# Patient Record
Sex: Male | Born: 1980
Health system: Southern US, Community
[De-identification: ages and names within clinical notes are randomized; demographics above are authoritative.]

## PROBLEM LIST (undated history)

## (undated) DIAGNOSIS — I4892 Unspecified atrial flutter: Secondary | ICD-10-CM

## (undated) DIAGNOSIS — Z720 Tobacco use: Secondary | ICD-10-CM

## (undated) DIAGNOSIS — I509 Heart failure, unspecified: Secondary | ICD-10-CM

## (undated) DIAGNOSIS — I10 Essential (primary) hypertension: Secondary | ICD-10-CM

## (undated) DIAGNOSIS — Z7289 Other problems related to lifestyle: Secondary | ICD-10-CM

## (undated) DIAGNOSIS — Z789 Other specified health status: Secondary | ICD-10-CM

## (undated) DIAGNOSIS — F1090 Alcohol use, unspecified, uncomplicated: Secondary | ICD-10-CM

## (undated) HISTORY — DX: Essential (primary) hypertension: I10

## (undated) NOTE — *Deleted (*Deleted)
***In Progress*** Referring Physician: PCP: [name], FNP PCP-Cardiologist: Dr. Gala Romney   HPI:  Mr Boening is a 59 year old with history of ETOH abuse, atrial flutter, tobacco abuse, and chronic systolic heart failure.   Admitted from the HF clinic on 09/17/20 with A/C decompensated heart failure and marked volume overload. On the day of admit, he continued to decompensate withSVT. He underwent emergent DC-CV and intubation. Placed on norepinephrine, milrinone, and amiodarone infusions.CCM was consulted for acute respiratory failure. Hospital course was complicated by septic shock from aspiration PNA and AKI. Gradually improved and RHC/LHC on 09/30/20 with normal coronaries, EF 40-45% and well compensated filling pressures. RV normal on ECHO. Discharge weight was 261 lbs.   He returned to clinic on 10/09/20 for post-hospital follow-up with Robbie Lis, PA. He was doing well. Weight was stable, down 3 lbs since discharge. Denied dyspnea w/ ADLs. No orthopnea, PND or LEE. Denied palpitations. NSR on EKG. BP was well controlled. Reported full med compliance with no intolerances or side effects. He reported that he had not smoked or consumed ETOH since hospital discharge.   Today he returns to HF clinic for pharmacist medication titration. At last visit with APP clinic, Farxiga 10 mg daily was added.   122/81, 82, 259 lbs *Entresto and Eliquis still sent to [facility] pharm > HF Fund ? Followed by paramedicine *No labs needed Plan A: Incr Coreg 6.25 Plan B: Incr hydral 75 tid / imdur 60/d F/u: 1/3 APP, 2/2 DB  Overall feeling ***. Dizziness, lightheadedness, fatigue:  Chest pain or palpitations:  How is your breathing?: *** SOB: Able to complete all ADLs. Activity level ***  Weight at home pounds. Takes furosemide/torsemide/bumex *** mg *** daily.  LEE PND/Orthopnea  Appetite *** Low-salt diet:   Physical Exam Cost/affordability of meds   HF Medications:  Carvedilol 3.125 mg BID Entresto 97/103 mg BID Spironolactone 25 mg daily Hydralazine 50 mg TID Isosorbide 30 mg daily Farxiga 10 mg daily  Has the patient been experiencing any side effects to the medications prescribed?  {YES NO:22349}  Does the patient have any problems obtaining medications due to transportation or finances?   {YES NO:22349} Eliquis from [facility]  Understanding of regimen: {excellent/good/fair/poor:19665} Understanding of indications: {excellent/good/fair/poor:19665} Potential of compliance: {excellent/good/fair/poor:19665}Followed by paramedicine Patient understands to avoid NSAIDs. Patient understands to avoid decongestants.    Pertinent Lab Values (10/16/20): Marland Kitchen Serum creatinine 1.04, BUN 14, Potassium 4, Sodium 138   Vital Signs: . Weight: *** (last clinic weight: 259 lbs) . Blood pressure: ***  . Heart rate: ***   Assessment: 1. 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  - recent admit for a/c decompensated HF 10/21 c/b SVT leading to cardiogenic shock  - Echo 10/21: EF 20%, RV moderately reduced  - R/LHC showed normal coronaries, EF 40-45%, and compensated filling pressures.Fick cardiac output/index = 8.2/3.3. D/c Wt 261 lb - NYHA class II. Wt stable, down 3 lbs. BP well controlled - Continue*** carvedilol ***3.125 mg BID - Continue Entresto 97/103 mg BID - Continue spironolactone 25 mg daily - Continue hydralazine 50 mg TID - Continue isosorbide mononitrate 30 mg daily - Continue Farxiga 10 mg daily - Continue paramedicine to help w/ med compliance   2. SVT - Recently admitted 10/21 w/ hemodynamically unstable SVT requiring emergent DCCV  - Most recent EKG showed NSR, denied palpitations - Continue*** carvedilol ***3.125 mg BID - He  has quit drinking, which should help reduce risk of recurrence  - Prior plans noted to refer to  EP for ablation if SVT recurs  3. PAF - NSR on recent EKG  - Continue Eliquis for stroke prophylaxis.  - Plans noted for sleep study once he has insurance (h/o snoring and HTN)  4. HTN: controlled on current regimen  - Continue*** carvedilol ***3.125 mg BID - Continue Entresto 97/103 mg BID - Continue spironolactone 25 mg daily - Continue hydralazine 50 mg TID  5. ETOH Abuse - He has not had alcohol since his discharge - Congratulated on efforts   6. Tobacco Abuse  - He has quit smoking - Congratulated on efforts    Plan: 1) Medication changes: Based on clinical presentation, vital signs and recent labs will *** 2) Labs: *** 3) Follow-up: ***  Tama Headings, PharmD, BCPS PGY2 Cardiology Pharmacy Resident  Karle Plumber, PharmD, BCPS, [facility], CPP Heart Failure Clinic Pharmacist [phone]

---

## 2004-09-24 ENCOUNTER — Encounter: Admission: RE | Admit: 2004-09-24 | Discharge: 2004-09-24 | Payer: Self-pay | Admitting: Occupational Medicine

## 2006-01-02 ENCOUNTER — Emergency Department (HOSPITAL_COMMUNITY): Admission: EM | Admit: 2006-01-02 | Discharge: 2006-01-02 | Payer: Self-pay | Admitting: *Deleted

## 2011-05-04 ENCOUNTER — Inpatient Hospital Stay (INDEPENDENT_AMBULATORY_CARE_PROVIDER_SITE_OTHER)
Admission: RE | Admit: 2011-05-04 | Discharge: 2011-05-04 | Disposition: A | Discharge status: Home / Self Care | Payer: Self-pay | Source: Ambulatory Visit | Attending: Family Medicine | Admitting: Family Medicine

## 2011-05-04 DIAGNOSIS — N342 Other urethritis: Secondary | ICD-10-CM

## 2011-05-04 LAB — POCT URINALYSIS DIP (DEVICE)
Bilirubin Urine: NEGATIVE
Glucose, UA: NEGATIVE mg/dL
Hgb urine dipstick: NEGATIVE
Ketones, ur: NEGATIVE mg/dL
Nitrite: NEGATIVE
Protein, ur: NEGATIVE mg/dL
Specific Gravity, Urine: >=1.03 (ref 1.005–1.030)
Urobilinogen, UA: 0.2 mg/dL (ref 0.0–1.0)
pH: 6 (ref 5.0–8.0)

## 2011-05-05 LAB — GC/CHLAMYDIA PROBE AMP, GENITAL
Chlamydia, DNA Probe: NEGATIVE
GC Probe Amp, Genital: NEGATIVE

## 2011-05-06 ENCOUNTER — Inpatient Hospital Stay (INDEPENDENT_AMBULATORY_CARE_PROVIDER_SITE_OTHER)
Admission: RE | Admit: 2011-05-06 | Discharge: 2011-05-06 | Disposition: A | Discharge status: Home / Self Care | Payer: Self-pay | Source: Ambulatory Visit | Attending: Family Medicine | Admitting: Family Medicine

## 2011-05-06 DIAGNOSIS — N342 Other urethritis: Secondary | ICD-10-CM

## 2011-05-06 LAB — POCT URINALYSIS DIP (DEVICE)
Bilirubin Urine: NEGATIVE
Glucose, UA: NEGATIVE mg/dL
Ketones, ur: NEGATIVE mg/dL
Nitrite: NEGATIVE
Protein, ur: NEGATIVE mg/dL
Specific Gravity, Urine: 1.015 (ref 1.005–1.030)
Urobilinogen, UA: 0.2 mg/dL (ref 0.0–1.0)
pH: 7 (ref 5.0–8.0)

## 2016-02-04 ENCOUNTER — Encounter (HOSPITAL_COMMUNITY): Payer: Self-pay | Admitting: Emergency Medicine

## 2016-02-04 ENCOUNTER — Emergency Department (HOSPITAL_COMMUNITY)
Admission: EM | Admit: 2016-02-04 | Discharge: 2016-02-04 | Disposition: A | Discharge status: Home / Self Care | Payer: Self-pay | Source: Home / Self Care | Attending: Emergency Medicine | Admitting: Emergency Medicine

## 2016-02-04 DIAGNOSIS — T148 Other injury of unspecified body region: Secondary | ICD-10-CM

## 2016-02-04 DIAGNOSIS — T148XXA Other injury of unspecified body region, initial encounter: Secondary | ICD-10-CM

## 2016-02-04 MED ORDER — IBUPROFEN 800 MG PO TABS: 800.0000 mg | ORAL_TABLET | Freq: Three times a day (TID) | ORAL | Status: DC | PRN

## 2016-02-04 MED ORDER — METHOCARBAMOL 500 MG PO TABS: 500.0000 mg | ORAL_TABLET | Freq: Three times a day (TID) | ORAL | Status: DC | PRN

## 2016-02-04 MED FILL — METHOCARBAMOL 500 MG TABLET: 500 | 10 days supply | Qty: 30 | Fill #0

## 2016-02-04 NOTE — ED Provider Notes (Signed)
CSN: [id number]     Arrival date & time 02/04/16  1309 History   First MD Initiated Contact with Patient 02/04/16 1451     Chief Complaint  Patient presents with  . Back Pain  . Neck Pain   HPI Antonio Tapia is a 35 y.o. male presenting for 2 days of left neck/shoulder pain.   He woke up with an "aching" and "tight" feeling in the left shoulder involving the left neck radiating to the upper back yesterday. The pain is moderate, present constantly, stable from onset. Ibuprofen 400mg  provided no relief. Turning head left or right makes pain worse. No shooting pain, history of trauma/injury, or pain with breathing.   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 Not a lot of heavy lifting/bending.  History reviewed. No pertinent past medical history. History reviewed. No pertinent past surgical history. No family history on file. Social History  Substance Use Topics  . Smoking status: None  . Smokeless tobacco: None  . Alcohol Use: None   Review of Systems: As above  Allergies  Review of patient's allergies indicates no known allergies.  Home Medications   Prior to Admission medications   Medication Sig Start Date End Date Taking? Authorizing Provider  ibuprofen (ADVIL,MOTRIN) 800 MG tablet Take 1 tablet (800 mg total) by mouth 3 (three) times daily as needed for moderate pain. 02/04/16   Tyrone Nine, MD  methocarbamol (ROBAXIN) 500 MG tablet Take 1 tablet (500 mg total) by mouth every 8 (eight) hours as needed for muscle spasms. 02/04/16   Tyrone Nine, MD   Meds Ordered and Administered this Visit  Medications - No data to display  BP 146/105 mmHg  Pulse 76  Temp(Src) 97.6 F (36.4 C) (Oral)  Resp 20  SpO2 100% No data found.  Physical Exam  Constitutional: He is oriented to person, place, and time. He appears well-developed and well-nourished. No distress.  HENT:  Head: Normocephalic and atraumatic.  Eyes: Conjunctivae are normal. Pupils are equal, round, and reactive to light. No  scleral icterus.  Neck: Neck supple. No JVD present.  Cardiovascular: Normal rate, regular rhythm, normal heart sounds and intact distal pulses.   No murmur heard. Pulmonary/Chest: Effort normal and breath sounds normal. No respiratory distress.  Abdominal: Soft. Bowel sounds are normal. He exhibits no distension. There is no tenderness.  Musculoskeletal:  Cervical paraspinal muscles without spasm, tender to palpation along L SCM and splenius capitis. Neck full felx/ex ROM, limited L/R rotation by pain. Left shoulder normal inspection, palpation, Neer/Hawkins, rotator cuff intact.   Lymphadenopathy:    He has no cervical adenopathy.  Neurological: He is alert and oriented to person, place, and time. He exhibits normal muscle tone.  Skin: Skin is warm and dry.  Vitals reviewed.  ED Course  Procedures (including critical care time)  Labs Review Labs Reviewed - No data to display  Imaging Review No results found.  MDM   1. Muscle strain    Centered around L splenius capitis/SCM likely from abnormal sleeping posture. No radiculopathy/trauma. Recommended rest, heating pad, massage, NSAIDs, tylenol prn. Also Rx short trial of muscle relaxer prn.   This patient's case was discussed with the attending provider who examined the patient and helped formulate the plan of care.   Tyrone Nine, MD 02/04/16 [phone]

## 2016-02-04 NOTE — Discharge Instructions (Signed)
You can take ibuprofen 800mg  three times a day as needed for pain, though I suspect this will get better on its own if you rest it and use heating pad. You should take this with 1000mg  of tylenol to help it work. You can also take the muscle relaxer as needed.   Muscle Strain A muscle strain is an injury that occurs when a muscle is stretched beyond its normal length. Usually a small number of muscle fibers are torn when this happens. Muscle strain is rated in degrees. First-degree strains have the least amount of muscle fiber tearing and pain. Second-degree and third-degree strains have increasingly more tearing and pain.  Usually, recovery from muscle strain takes 1-2 weeks. Complete healing takes 5-6 weeks.  CAUSES  Muscle strain happens when a sudden, violent force placed on a muscle stretches it too far. This may occur with lifting, sports, or a fall.  RISK FACTORS Muscle strain is especially common in athletes.  SIGNS AND SYMPTOMS At the site of the muscle strain, there may be:  Pain.  Bruising.  Swelling.  Difficulty using the muscle due to pain or lack of normal function. DIAGNOSIS  Your health care provider will perform a physical exam and ask about your medical history. TREATMENT  Often, the best treatment for a muscle strain is resting, icing, and applying cold compresses to the injured area.  HOME CARE INSTRUCTIONS   Use the PRICE method of treatment to promote muscle healing during the first 2-3 days after your injury. The PRICE method involves:  Protecting the muscle from being injured again.  Restricting your activity and resting the injured body part.  Icing your injury. To do this, put ice in a plastic bag. Place a towel between your skin and the bag. Then, apply the ice and leave it on from 15-20 minutes each hour. After the third day, switch to moist heat packs.  Apply compression to the injured area with a splint or elastic bandage. Be careful not to wrap it too  tightly. This may interfere with blood circulation or increase swelling.  Elevate the injured body part above the level of your heart as often as you can.  Only take over-the-counter or prescription medicines for pain, discomfort, or fever as directed by your health care provider.  Warming up prior to exercise helps to prevent future muscle strains. SEEK MEDICAL CARE IF:   You have increasing pain or swelling in the injured area.  You have numbness, tingling, or a significant loss of strength in the injured area. MAKE SURE YOU:   Understand these instructions.  Will watch your condition.  Will get help right away if you are not doing well or get worse.

## 2016-02-04 NOTE — ED Notes (Signed)
C/o upper back and neck pain onset yest am when he woke up... States he was fine the night before Denies inj/trauma... Pain increases w/activity... Taking ibup w/no relief.  A&O x4.. No acute distress.

## 2016-08-23 ENCOUNTER — Emergency Department (HOSPITAL_COMMUNITY): Payer: Self-pay

## 2016-08-23 ENCOUNTER — Encounter (HOSPITAL_COMMUNITY): Payer: Self-pay | Admitting: Emergency Medicine

## 2016-08-23 ENCOUNTER — Emergency Department (HOSPITAL_COMMUNITY)
Admission: EM | Admit: 2016-08-23 | Discharge: 2016-08-23 | Disposition: A | Discharge status: Home / Self Care | Payer: Self-pay | Attending: Emergency Medicine | Admitting: Emergency Medicine

## 2016-08-23 DIAGNOSIS — L03211 Cellulitis of face: Secondary | ICD-10-CM | POA: Insufficient documentation

## 2016-08-23 DIAGNOSIS — K0510 Chronic gingivitis, plaque induced: Secondary | ICD-10-CM | POA: Insufficient documentation

## 2016-08-23 DIAGNOSIS — B999 Unspecified infectious disease: Secondary | ICD-10-CM

## 2016-08-23 DIAGNOSIS — Z79899 Other long term (current) drug therapy: Secondary | ICD-10-CM | POA: Insufficient documentation

## 2016-08-23 LAB — URINALYSIS, ROUTINE W REFLEX MICROSCOPIC
Bilirubin Urine: NEGATIVE
Glucose, UA: NEGATIVE mg/dL
Hgb urine dipstick: NEGATIVE
Ketones, ur: NEGATIVE mg/dL
Leukocytes, UA: NEGATIVE
Nitrite: NEGATIVE
Protein, ur: NEGATIVE mg/dL
Specific Gravity, Urine: 1.037 — ABNORMAL HIGH (ref 1.005–1.030)
pH: 6 (ref 5.0–8.0)

## 2016-08-23 LAB — CBC WITH DIFFERENTIAL/PLATELET
Basophils Absolute: 0 10*3/uL (ref 0.0–0.1)
Basophils Relative: 0 %
Eosinophils Absolute: 0.1 10*3/uL (ref 0.0–0.7)
Eosinophils Relative: 1 %
HCT: 41.9 % (ref 39.0–52.0)
Hemoglobin: 14.4 g/dL (ref 13.0–17.0)
Lymphocytes Relative: 12 %
Lymphs Abs: 1.1 10*3/uL (ref 0.7–4.0)
MCH: 33 pg (ref 26.0–34.0)
MCHC: 34.4 g/dL (ref 30.0–36.0)
MCV: 95.9 fL (ref 78.0–100.0)
Monocytes Absolute: 0.9 10*3/uL (ref 0.1–1.0)
Monocytes Relative: 9 %
Neutro Abs: 7.7 10*3/uL (ref 1.7–7.7)
Neutrophils Relative %: 78 %
Platelets: 195 10*3/uL (ref 150–400)
RBC: 4.37 MIL/uL (ref 4.22–5.81)
RDW: 12.2 % (ref 11.5–15.5)
WBC: 9.9 10*3/uL (ref 4.0–10.5)

## 2016-08-23 LAB — COMPREHENSIVE METABOLIC PANEL
ALT: 45 U/L (ref 17–63)
AST: 43 U/L — ABNORMAL HIGH (ref 15–41)
Albumin: 3.5 g/dL (ref 3.5–5.0)
Alkaline Phosphatase: 63 U/L (ref 38–126)
Anion gap: 10 (ref 5–15)
BUN: 11 mg/dL (ref 6–20)
CO2: 23 mmol/L (ref 22–32)
Calcium: 8.8 mg/dL — ABNORMAL LOW (ref 8.9–10.3)
Chloride: 104 mmol/L (ref 101–111)
Creatinine, Ser: 1.26 mg/dL — ABNORMAL HIGH (ref 0.61–1.24)
GFR calc Af Amer: >60 mL/min (ref >60)
GFR calc non Af Amer: >60 mL/min (ref >60)
Glucose, Bld: 103 mg/dL — ABNORMAL HIGH (ref 65–99)
Potassium: 3.6 mmol/L (ref 3.5–5.1)
Sodium: 137 mmol/L (ref 135–145)
Total Bilirubin: 0.8 mg/dL (ref 0.3–1.2)
Total Protein: 7.7 g/dL (ref 6.5–8.1)

## 2016-08-23 LAB — I-STAT CG4 LACTIC ACID, ED
Lactic Acid, Venous: 1.06 mmol/L (ref 0.5–1.9)
Lactic Acid, Venous: 0.91 mmol/L (ref 0.5–1.9)

## 2016-08-23 MED ORDER — LORAZEPAM 2 MG/ML IJ SOLN
0.5000 mg | Freq: Once | INTRAMUSCULAR | Status: AC
  Administered 2016-08-23: 0.5 mg via INTRAVENOUS
  Filled 2016-08-23: qty 1

## 2016-08-23 MED ORDER — SODIUM CHLORIDE 0.9 % IV SOLN
1000.0000 mL | Freq: Once | INTRAVENOUS | Status: AC
  Administered 2016-08-23: 1000 mL via INTRAVENOUS

## 2016-08-23 MED ORDER — CLINDAMYCIN PHOSPHATE 600 MG/50ML IV SOLN
600.0000 mg | Freq: Once | INTRAVENOUS | Status: AC
  Administered 2016-08-23: 600 mg via INTRAVENOUS
  Filled 2016-08-23: qty 50

## 2016-08-23 MED ORDER — SODIUM CHLORIDE 0.9 % IV SOLN
1000.0000 mL | INTRAVENOUS | Status: DC
  Administered 2016-08-23: 1000 mL via INTRAVENOUS

## 2016-08-23 MED ORDER — CLINDAMYCIN HCL 150 MG PO CAPS: 150.0000 mg | ORAL_CAPSULE | Freq: Four times a day (QID) | ORAL | 0 refills | Status: DC

## 2016-08-23 MED ORDER — IOPAMIDOL (ISOVUE-300) INJECTION 61%
INTRAVENOUS | Status: AC
  Administered 2016-08-23: 75 mL
  Filled 2016-08-23: qty 75

## 2016-08-23 NOTE — ED Notes (Signed)
Patient transported to CT 

## 2016-08-23 NOTE — Discharge Instructions (Signed)
Take all antibiotics as prescribed. Return if any swelling in your mouth, difficulty speaking, difficulty swallowing, or difficulty breathing. Follow-up with dentist or oral surgeon as soon as possible

## 2016-08-23 NOTE — ED Triage Notes (Signed)
Pt sts swelling to face and head at eye and back of head x 3 days; no distress noted

## 2016-08-23 NOTE — ED Provider Notes (Signed)
[facility]-EMERGENCY DEPT Provider Note   CSN: [id number] Arrival date & time: 08/23/16  1632     History   Chief Complaint Chief Complaint  Patient presents with  . Facial Swelling    HPI Antonio Tapia is a 35 y.o. male.  HPI This is a 35 year old male who presents today complaining of facial pain and swelling that began 3 days ago and has been gradual on his right cheek. He denies any trauma to his head. He denies any difficulty breathing, speaking, or swallowing. He states that he has very poor dentition and feels that he has had infections in his teeth.He denies any fever at home, lightheadedness, or weakness  History reviewed. No pertinent past medical history.  There are no active problems to display for this patient.   History reviewed. No pertinent surgical history.     Home Medications    Prior to Admission medications   Medication Sig Start Date End Date Taking? Authorizing Provider  ibuprofen (ADVIL,MOTRIN) 800 MG tablet Take 1 tablet (800 mg total) by mouth 3 (three) times daily as needed for moderate pain. 02/04/16   Tyrone Nine, MD  methocarbamol (ROBAXIN) 500 MG tablet Take 1 tablet (500 mg total) by mouth every 8 (eight) hours as needed for muscle spasms. 02/04/16   Tyrone Nine, MD    Family History History reviewed. No pertinent family history.  Social History Social History  Substance Use Topics  . Smoking status: Current Every Day Smoker  . Smokeless tobacco: Never Used  . Alcohol use Yes     Allergies   Review of patient's allergies indicates no known allergies.   Review of Systems Review of Systems  All other systems reviewed and are negative.    Physical Exam Updated Vital Signs BP 141/86   Pulse 87   Temp 99.5 F (37.5 C) (Oral)   Resp 17   SpO2 100%   Physical Exam  Constitutional: He is oriented to person, place, and time. He appears well-developed and well-nourished.  HENT:  Head: Normocephalic and atraumatic.  Right Ear:  Tympanic membrane and external ear normal.  Left Ear: Tympanic membrane and external ear normal.  Nose: Nose normal. Right sinus exhibits no maxillary sinus tenderness and no frontal sinus tenderness. Left sinus exhibits no maxillary sinus tenderness and no frontal sinus tenderness.  Right facial erythema with mild swelling right cheek. Diffuse poor dentition with gingivitis Right preauricular node  Eyes: Conjunctivae and EOM are normal. Pupils are equal, round, and reactive to light. Right eye exhibits no nystagmus. Left eye exhibits no nystagmus.  Neck: Normal range of motion. Neck supple.  Cardiovascular: Normal rate, regular rhythm, normal heart sounds and intact distal pulses.   Pulmonary/Chest: Effort normal and breath sounds normal. No respiratory distress. He exhibits no tenderness.  Abdominal: Soft. Bowel sounds are normal. He exhibits no distension and no mass. There is no tenderness.  Musculoskeletal: Normal range of motion. He exhibits no edema or tenderness.  Neurological: He is alert and oriented to person, place, and time. He has normal strength and normal reflexes. No sensory deficit. He displays a negative Romberg sign. GCS eye subscore is 4. GCS verbal subscore is 5. GCS motor subscore is 6. He displays no Babinski's sign on the right side. He displays no Babinski's sign on the left side.   Speech is normal without dysarthria, dysphasia, or aphasia. Muscle strength is 5/5   Skin: Skin is warm and dry. No rash noted.  Psychiatric: He has a normal  mood and affect. His behavior is normal. Judgment and thought content normal.  Nursing note and vitals reviewed.    ED Treatments / Results  Labs (all labs ordered are listed, but only abnormal results are displayed) Labs Reviewed  COMPREHENSIVE METABOLIC PANEL - Abnormal; Notable for the following:       Result Value   Glucose, Bld 103 (*)    Creatinine, Ser 1.26 (*)    Calcium 8.8 (*)    AST 43 (*)    All other components  within normal limits  CULTURE, BLOOD (ROUTINE X 2)  CULTURE, BLOOD (ROUTINE X 2)  URINE CULTURE  CBC WITH DIFFERENTIAL/PLATELET  URINALYSIS, ROUTINE W REFLEX MICROSCOPIC (NOT AT [facility])  I-STAT CG4 LACTIC ACID, ED  I-STAT CG4 LACTIC ACID, ED    EKG  EKG Interpretation  Date/Time:  Sunday August 23 2016 18:39:43 EDT Ventricular Rate:  93 PR Interval:    QRS Duration: 87 QT Interval:  373 QTC Calculation: 464 R Axis:   30 Text Interpretation:  Normal sinus rhythm Normal ECG Confirmed by Shantina Chronister MD, Duwayne Heck ([id number]) on 08/23/2016 7:39:45 PM       Radiology Ct Maxillofacial W Contrast  Result Date: 08/23/2016 CLINICAL DATA:  Right-sided facial pain and swelling, onset 2 days prior. Concern for abscess/ infection. EXAM: CT MAXILLOFACIAL WITH CONTRAST TECHNIQUE: Multidetector CT imaging of the maxillofacial structures was performed with intravenous contrast. Multiplanar CT image reconstructions were also generated. A small metallic BB was placed on the right temple in order to reliably differentiate right from left. CONTRAST:  78mL ISOVUE-300 IOPAMIDOL (ISOVUE-300) INJECTION 61% COMPARISON:  None. FINDINGS: There are innumerable large dental caries involving both upper and lower teeth. There periapical lucencies about the right upper first and second molars, right upper cuspid left lower first bicuspid and right lower second molar, left lower central incisor, and left lower central molar. Right upper cuspid periapical lucency extends anteriorly with cortical breakthrough with adjacent focus of soft tissue gas. There is complete opacification of the right maxillary sinus with mild enhancement. Probable mucous retention cyst in the left maxillary sinus. Subcutaneous edema about the right greater than left face. In the subcutaneous tissues superficial to the right mandibular ramus is a 1.6 x 1.1 cm low-density structure with peripheral enhancement. Innumerable bilateral enlarged cervical chain nodes,  right greater than left. Likely parotid atrophy bilaterally. Limited intracranial is unremarkable. Visualized orbits are normal. IMPRESSION: 1. Extensive periodontal disease with multiple dental caries and periapical lucencies. Source of complicated infection with likely right upper cuspid, peri-apical lucency has an adjacent focus of gas but no periapical defined fluid collection. There is likely extension of infection into the right maxillary sinus. 2. Subcutaneous soft tissue edema is bilateral. Peripherally enhancing fluid density structure in the right face soft tissues 1.6 cm greatest dimension may be a small abscess versus necrotic node. Additional cervical adenopathy bilaterally is likely reactive. Electronically Signed   By: Rubye Oaks M.D.   On: 08/23/2016 20:34    Procedures Procedures (including critical care time)  Medications Ordered in ED Medications  0.9 %  sodium chloride infusion (1,000 mLs Intravenous New Bag/Given 08/23/16 1847)    Followed by  0.9 %  sodium chloride infusion (1,000 mLs Intravenous New Bag/Given 08/23/16 1850)    Followed by  0.9 %  sodium chloride infusion (1,000 mLs Intravenous New Bag/Given 08/23/16 1853)  clindamycin (CLEOCIN) IVPB 600 mg (600 mg Intravenous New Bag/Given 08/23/16 1853)  LORazepam (ATIVAN) injection 0.5 mg (0.5 mg Intravenous Given 08/23/16  1850)  iopamidol (ISOVUE-300) 61 % injection (75 mLs  Contrast Given 08/23/16 1946)     Initial Impression / Assessment and Plan / ED Course  I have reviewed the triage vital signs and the nursing notes.  Pertinent labs & imaging results that were available during my care of the patient were reviewed by me and considered in my medical decision making (see chart for details).  Clinical Course  Iv clindamycin given.  Patient without spreading erythema.  Likely source dental.  Reviewed ct.  discussed results with patient. He voices that he we'll have the clindamycin filled. He is given referral to  oral surgery. We have discussed return precautions if he voices understanding.   Final Clinical Impressions(s) / ED Diagnoses   Final diagnoses:  Infection  Facial cellulitis  Gingivitis    New Prescriptions New Prescriptions   No medications on file     Margarita Grizzleanielle Beverlie Kurihara, MD 08/23/16 2111

## 2016-08-25 LAB — URINE CULTURE: Culture: NO GROWTH

## 2016-08-28 LAB — CULTURE, BLOOD (ROUTINE X 2)
Culture: NO GROWTH
Culture: NO GROWTH

## 2017-05-28 ENCOUNTER — Telehealth: Payer: Self-pay | Admitting: *Deleted

## 2017-05-28 ENCOUNTER — Ambulatory Visit: Payer: Self-pay | Admitting: Student

## 2017-05-28 NOTE — Telephone Encounter (Signed)
Tried to contact pt about his appointment today, he spoke with Annice Pih and when I went to call him for his appointment with Dr. Alanda Slim he was not there and I checked outside and did not see him. Not sure if he thought his appointment was over after he spoke with Annice Pih or what. Lamonte Sakai, April D, [place]

## 2018-09-19 ENCOUNTER — Emergency Department (HOSPITAL_COMMUNITY)
Admission: EM | Admit: 2018-09-19 | Discharge: 2018-09-19 | Disposition: A | Discharge status: Home / Self Care | Payer: Self-pay | Attending: Emergency Medicine | Admitting: Emergency Medicine

## 2018-09-19 ENCOUNTER — Telehealth (HOSPITAL_COMMUNITY): Payer: Self-pay | Admitting: *Deleted

## 2018-09-19 ENCOUNTER — Other Ambulatory Visit: Payer: Self-pay | Admitting: *Deleted

## 2018-09-19 ENCOUNTER — Encounter (HOSPITAL_COMMUNITY): Payer: Self-pay

## 2018-09-19 ENCOUNTER — Emergency Department (HOSPITAL_COMMUNITY): Payer: Self-pay

## 2018-09-19 ENCOUNTER — Encounter: Payer: Self-pay | Admitting: Family Medicine

## 2018-09-19 ENCOUNTER — Other Ambulatory Visit: Payer: Self-pay

## 2018-09-19 ENCOUNTER — Ambulatory Visit (INDEPENDENT_AMBULATORY_CARE_PROVIDER_SITE_OTHER): Payer: Self-pay | Admitting: Family Medicine

## 2018-09-19 VITALS — BP 154/115 | HR 196 | Temp 98.2°F | Resp 18 | Ht 77.0 in | Wt 252.4 lb

## 2018-09-19 DIAGNOSIS — I4892 Unspecified atrial flutter: Secondary | ICD-10-CM | POA: Insufficient documentation

## 2018-09-19 DIAGNOSIS — F17200 Nicotine dependence, unspecified, uncomplicated: Secondary | ICD-10-CM | POA: Insufficient documentation

## 2018-09-19 DIAGNOSIS — R0789 Other chest pain: Secondary | ICD-10-CM

## 2018-09-19 DIAGNOSIS — R002 Palpitations: Secondary | ICD-10-CM

## 2018-09-19 LAB — BASIC METABOLIC PANEL
Anion gap: 8 (ref 5–15)
BUN: 15 mg/dL (ref 6–20)
CALCIUM: 8.8 mg/dL — AB (ref 8.9–10.3)
CHLORIDE: 105 mmol/L (ref 98–111)
CO2: 24 mmol/L (ref 22–32)
CREATININE: 1.14 mg/dL (ref 0.61–1.24)
GFR calc non Af Amer: >60 mL/min (ref >60)
Glucose, Bld: 101 mg/dL — ABNORMAL HIGH (ref 70–99)
Potassium: 4 mmol/L (ref 3.5–5.1)
Sodium: 137 mmol/L (ref 135–145)

## 2018-09-19 LAB — CBC
HEMATOCRIT: 44.3 % (ref 39.0–52.0)
Hemoglobin: 14.6 g/dL (ref 13.0–17.0)
MCH: 32.7 pg (ref 26.0–34.0)
MCHC: 33 g/dL (ref 30.0–36.0)
MCV: 99.1 fL (ref 78.0–100.0)
PLATELETS: 224 10*3/uL (ref 150–400)
RBC: 4.47 MIL/uL (ref 4.22–5.81)
RDW: 12.4 % (ref 11.5–15.5)
WBC: 4.1 10*3/uL (ref 4.0–10.5)

## 2018-09-19 LAB — GLUCOSE, POCT (MANUAL RESULT ENTRY): POC Glucose: 114 mg/dl — AB (ref 70–99)

## 2018-09-19 LAB — MAGNESIUM: Magnesium: 2.2 mg/dL (ref 1.7–2.4)

## 2018-09-19 MED ORDER — METOPROLOL TARTRATE 50 MG PO TABS: 50.0000 mg | ORAL_TABLET | Freq: Two times a day (BID) | ORAL | 0 refills | Status: DC

## 2018-09-19 MED ORDER — RIVAROXABAN 20 MG PO TABS
20.0000 mg | ORAL_TABLET | Freq: Every day | ORAL | Status: DC
  Administered 2018-09-19: 20 mg via ORAL
  Filled 2018-09-19: qty 1

## 2018-09-19 MED ORDER — RIVAROXABAN 20 MG PO TABS: 20.0000 mg | ORAL_TABLET | Freq: Every day | ORAL | 0 refills | Status: DC

## 2018-09-19 MED ORDER — LORAZEPAM 2 MG/ML IJ SOLN: 0.5000 mg | Freq: Once | INTRAMUSCULAR | Status: DC

## 2018-09-19 MED ORDER — METOPROLOL TARTRATE 5 MG/5ML IV SOLN
5.0000 mg | Freq: Once | INTRAVENOUS | Status: AC
  Administered 2018-09-19: 5 mg via INTRAVENOUS
  Filled 2018-09-19: qty 5

## 2018-09-19 MED ORDER — SODIUM CHLORIDE 0.9 % IV BOLUS
1000.0000 mL | Freq: Once | INTRAVENOUS | Status: AC
  Administered 2018-09-19: 1000 mL via INTRAVENOUS

## 2018-09-19 NOTE — ED Triage Notes (Signed)
[facility]- pt coming from PCP office with complaint of heart racing. Intermittent X4 days. Denies chest pain, nausea, or dizziness. Denies cardiac hx. Pt states he does frequently drink energy drinks.

## 2018-09-19 NOTE — Telephone Encounter (Signed)
LMOM for pt to call and sched. F/u in afib clinic.

## 2018-09-19 NOTE — Discharge Instructions (Signed)
You were in a flutter here You converted to sinus rhythm on your own You are being prescribed blood thinners and are to follow-up with the atrial fib clinic You are also being placed on a beta-blocker to keep your heart rate lower.  Please avoid alcohol.  Stop your ibuprofen Return here if you are worse at any time

## 2018-09-19 NOTE — ED Notes (Addendum)
Pt family arrived and waiting in lobby.

## 2018-09-19 NOTE — Progress Notes (Signed)
Patient with ekg showing a narrow complex tachycardia 190s Reports intermittent sx x 4 days Reports some diaphoresis and headahces Denies any CP, SOB, dizziness Reports sign caffeine intake Denies any h/o HTN or other cardiac concerns Denies any PMH and surgical history Grandfather with HTN Denies taking any meds NKDA 98.2, 196, 154/115, 97% RA, 6'5" 252 lbs, BMI 30 He is AAOx3, NAD Diaphoretic No thyromegaly Tachy, regular CTAB + BS, soft, nt/nd, no masses No edema EKG: sinus, HR 194, st depression, short PR Glucose wnl Patient sent to ER via EMS for further evaluation and mgt of new onset narrow complex tachycardia

## 2018-09-19 NOTE — Progress Notes (Signed)
Labs resulted.

## 2018-09-19 NOTE — ED Provider Notes (Signed)
[facility] EMERGENCY DEPARTMENT Provider Note   CSN: [id number] Arrival date & time: 09/19/18  1224     History   Chief Complaint Chief Complaint  Patient presents with  . Tachycardia    HPI Antonio Tapia is a 37 y.o. male.  HPI  37 year old man presents today complaining of palpitations in the past 4 days.  Ports he does drink alcohol on a regular basis and last drink at 4 AM.  He has not had any significant S alcohol intake.  Was at work today when the palpitations worsened.  He presented to [facility] urgent care with this.  He was noted to have a heart rate up to 200.  He was transferred here.  Is also been drinking caffeinated beverages today.  Denies chest pain, dyspnea, cocaine or travel or drug use.  History reviewed. No pertinent past medical history.  There are no active problems to display for this patient.   History reviewed. No pertinent surgical history.      Home Medications    Prior to Admission medications   Medication Sig Start Date End Date Taking? Authorizing Provider  clindamycin (CLEOCIN) 150 MG capsule Take 1 capsule (150 mg total) by mouth every 6 (six) hours. Patient not taking: Reported on 09/19/2018 08/23/16   Margarita Grizzle, MD  ibuprofen (ADVIL,MOTRIN) 800 MG tablet Take 1 tablet (800 mg total) by mouth 3 (three) times daily as needed for moderate pain. Patient not taking: Reported on 09/19/2018 02/04/16   Tyrone Nine, MD  methocarbamol (ROBAXIN) 500 MG tablet Take 1 tablet (500 mg total) by mouth every 8 (eight) hours as needed for muscle spasms. Patient not taking: Reported on 09/19/2018 02/04/16   Tyrone Nine, MD    Family History History reviewed. No pertinent family history.  Social History Social History   Tobacco Use  . Smoking status: Current Every Day Smoker  . Smokeless tobacco: Never Used  Substance Use Topics  . Alcohol use: Yes  . Drug use: No     Allergies   Patient has no known  allergies.   Review of Systems Review of Systems  All other systems reviewed and are negative.    Physical Exam Updated Vital Signs BP (!) 179/118   Resp (!) 27   Physical Exam  Constitutional: He is oriented to person, place, and time. He appears well-developed and well-nourished.  HENT:  Head: Normocephalic and atraumatic.  Right Ear: External ear normal.  Left Ear: External ear normal.  Mouth/Throat: Oropharynx is clear and moist.  Eyes: Pupils are equal, round, and reactive to light. EOM are normal.  Neck: Normal range of motion.  Cardiovascular: An irregularly irregular rhythm present. Tachycardia present.  Pulmonary/Chest: Effort normal and breath sounds normal.  Abdominal: Soft. Bowel sounds are normal.  Musculoskeletal: Normal range of motion.  Neurological: He is alert and oriented to person, place, and time.  Skin: Skin is warm and dry. Capillary refill takes less than 2 seconds.  Psychiatric: He has a normal mood and affect.  Nursing note and vitals reviewed.    ED Treatments / Results  Labs (all labs ordered are listed, but only abnormal results are displayed) Labs Reviewed  BASIC METABOLIC PANEL  MAGNESIUM  CBC    EKG EKG Interpretation  Date/Time:  Monday September 19 2018 12:26:37 EDT Ventricular Rate:  136 PR Interval:    QRS Duration: 82 QT Interval:  339 QTC Calculation: 510 R Axis:   -95 Text Interpretation:  Atrial flutter with varied AV block, Right superior axis Repol abnrm suggests ischemia, diffuse leads Borderline ST elevation, anterolateral leads Prolonged QT interval Confirmed by Margarita Grizzle [phone]) on 09/19/2018 2:24:36 PM  ED ECG REPORT   Date: 09/19/2018  Rate: 100  Rhythm: normal sinus rhythm  QRS Axis: normal  Intervals: normal  ST/T Wave abnormalities: nonspecific ST/T changes  Conduction Disutrbances:none  Narrative Interpretation:   Old EKG Reviewed: unchanged  I have personally reviewed the EKG tracing and agree  with the computerized printout as noted.   Radiology No results found.  Procedures Procedures (including critical care time)  Medications Ordered in ED Medications  rivaroxaban (XARELTO) tablet 20 mg (has no administration in time range)  LORazepam (ATIVAN) injection 0.5 mg (has no administration in time range)  sodium chloride 0.9 % bolus 1,000 mL (1,000 mLs Intravenous New Bag/Given 09/19/18 1307)     Initial Impression / Assessment and Plan / ED Course  I have reviewed the triage vital signs and the nursing notes.  Pertinent labs & imaging results that were available during my care of the patient were reviewed by me and considered in my medical decision making (see chart for details).         CHA2DS2/VAS Stroke Risk Points  Current as of 7 minutes ago     ? >= 2 Points: High Risk  1 - 1.99 Points: Medium Risk  0 Points: Low Risk    A final score could not be computed because of missing components.:   Last Change: N/A     Details    This score determines the patient's risk of having a stroke if the  patient has atrial fibrillation.       Points Metrics  0 Has Congestive Heart Failure:  No    Current as of 7 minutes ago  0 Has Vascular Disease:  No    Current as of 7 minutes ago  0 Has Hypertension:  No    Current as of 7 minutes ago  0 Age:  22    Current as of 7 minutes ago  0 Has Diabetes:  No    Current as of 7 minutes ago  0 Had Stroke:  No  Had TIA:  No  Had thromboembolism:  No    Current as of 7 minutes ago  0 Male:  No    Current as of 7 minutes ago      This patients CHA2DS2-VASc Score and unadjusted Ischemic Stroke Rate (% per year) is equal to 0.2 % stroke rate/year from a score of 0  Above score calculated as 1 point each if present [CHF, HTN, DM, Vascular=MI/PAD/Aortic Plaque, Age if 65-74, or Male] Above score calculated as 2 points each if present [Age > 75, or Stroke/TIA/TE]      37 year old man history of alcohol use presents today  with atrial flutter.  It spontaneously converted prior to intervention.  He is remained stable here.  His CBC and chemistries are normal.  He did receive IV fluids and Ativan here in ED.  He is also received Xarelto per pharmacy.  He is given coupon.  He will be given referral to fib clinic.  Patient advised to plenty of fluids.  He is started on Lopressor.  Final Clinical Impressions(s) / ED Diagnoses   Final diagnoses:  Atrial flutter, unspecified type [facility])    ED Discharge Orders         Ordered    Amb referral to AFIB Clinic  09/19/18 1237           Margarita Grizzle, MD 09/19/18 1504

## 2018-09-22 ENCOUNTER — Other Ambulatory Visit (HOSPITAL_COMMUNITY): Payer: Self-pay | Admitting: *Deleted

## 2018-09-22 ENCOUNTER — Ambulatory Visit (HOSPITAL_COMMUNITY)
Admission: RE | Admit: 2018-09-22 | Discharge: 2018-09-22 | Disposition: A | Discharge status: Home / Self Care | Payer: Self-pay | Source: Ambulatory Visit | Attending: Nurse Practitioner | Admitting: Nurse Practitioner

## 2018-09-22 ENCOUNTER — Encounter (HOSPITAL_COMMUNITY): Payer: Self-pay | Admitting: Nurse Practitioner

## 2018-09-22 VITALS — BP 158/110 | HR 88 | Ht 72.0 in | Wt 261.0 lb

## 2018-09-22 DIAGNOSIS — I517 Cardiomegaly: Secondary | ICD-10-CM | POA: Insufficient documentation

## 2018-09-22 DIAGNOSIS — I4510 Unspecified right bundle-branch block: Secondary | ICD-10-CM | POA: Insufficient documentation

## 2018-09-22 DIAGNOSIS — Z7901 Long term (current) use of anticoagulants: Secondary | ICD-10-CM | POA: Insufficient documentation

## 2018-09-22 DIAGNOSIS — Z79899 Other long term (current) drug therapy: Secondary | ICD-10-CM | POA: Insufficient documentation

## 2018-09-22 DIAGNOSIS — Z791 Long term (current) use of non-steroidal anti-inflammatories (NSAID): Secondary | ICD-10-CM | POA: Insufficient documentation

## 2018-09-22 DIAGNOSIS — F17200 Nicotine dependence, unspecified, uncomplicated: Secondary | ICD-10-CM | POA: Insufficient documentation

## 2018-09-22 DIAGNOSIS — I483 Typical atrial flutter: Secondary | ICD-10-CM | POA: Insufficient documentation

## 2018-09-22 MED ORDER — METOPROLOL TARTRATE 50 MG PO TABS: 50.0000 mg | ORAL_TABLET | Freq: Two times a day (BID) | ORAL | 3 refills | Status: DC

## 2018-09-22 NOTE — Progress Notes (Signed)
Primary Care Physician: Patient, No Pcp Per Referring Physician:[facility] ER f/u   Antonio Tapia is a 37 y.o. male with a h/o tobacco and alcohol use that is in the afib clinic for ER visit for typical atrial flutter with RVR. He first presented to [facility] with HR around 200 bpm and then sent to [facility] ER. He spontaneously converted without  Intervention. He was started on BB and xarelto for a chadsvasc score of 1 for HTN, his BP was significantly elevated on presentation.  He states that he drinks around 4 alcoholic drinks a night and heavy consumption of caffeine, including energy drinks. No illicit dugs but also smokes tobacco. He has witnessed snoring and apnea spells. 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  Today, he denies symptoms of palpitations, chest pain, shortness of breath, orthopnea, PND, lower extremity edema, dizziness, presyncope, syncope, or neurologic sequela. The patient is tolerating medications without difficulties and is otherwise without complaint today.   No past medical history on file. No past surgical history on file.  Current Outpatient Medications  Medication Sig Dispense Refill  . ibuprofen (ADVIL,MOTRIN) 200 MG tablet Take 600 mg by mouth every 6 (six) hours as needed (or pain or headaches).    . rivaroxaban (XARELTO) 20 MG TABS tablet Take 1 tablet (20 mg total) by mouth daily with supper. 30 tablet 0  . metoprolol tartrate (LOPRESSOR) 50 MG tablet Take 1 tablet (50 mg total) by mouth 2 (two) times daily. 60 tablet 3   No current facility-administered medications for this encounter.     No Known Allergies  Social History   Socioeconomic History  . Marital status: Single    Spouse name: Not on file  . Number of children: Not on file  . Years of education: Not on file  . Highest education level: Not on file  Occupational History    . Not on file  Social Needs  . Financial resource strain: Not on file  . Food insecurity:    Worry: Not on file    Inability: Not on file  . Transportation needs:    Medical: Not on file    Non-medical: Not on file  Tobacco Use  . Smoking status: Current Every Day Smoker  . Smokeless tobacco: Never Used  Substance and Sexual Activity  . Alcohol use: Yes  . Drug use: No  . Sexual activity: Not on file  Lifestyle  . Physical activity:    Days per week: Not on file    Minutes per session: Not on file  . Stress: Not on file  Relationships  . Social connections:    Talks on phone: Not on file    Gets together: Not on file    Attends religious service: Not on file    Active member of club or organization: Not on file    Attends meetings of clubs or organizations: Not on file    Relationship status: Not on file  . Intimate partner violence:    Fear of current or ex partner: Not on file    Emotionally abused: Not on file    Physically abused: Not on file    Forced sexual activity: Not on file  Other Topics Concern  . Not on file  Social History Narrative  . Not on file    No family history on file.  ROS- All  systems are reviewed and negative except as per the HPI above  Physical Exam: Vitals:   09/22/18 0835  BP: (!) 158/110  Pulse: 88  SpO2: 97%  Weight: 118.4 kg  Height: 6' (1.829 m)   Wt Readings from Last 3 Encounters:  09/22/18 118.4 kg  09/19/18 114.5 kg    Labs: Lab Results  Component Value Date   NA 137 09/19/2018   K 4.0 09/19/2018   CL 105 09/19/2018   CO2 24 09/19/2018   GLUCOSE 101 (H) 09/19/2018   BUN 15 09/19/2018   CREATININE 1.14 09/19/2018   CALCIUM 8.8 (L) 09/19/2018   MG 2.2 09/19/2018   No results found for: INR No results found for: CHOL, HDL, LDLCALC, TRIG   GEN- The patient is well appearing, alert and oriented x 3 today.   Head- normocephalic, atraumatic Eyes-  Sclera clear, conjunctiva pink Ears- hearing  intact Oropharynx- clear Neck- supple, no JVP Lymph- no cervical lymphadenopathy Lungs- Clear to ausculation bilaterally, normal work of breathing Heart- Regular rate and rhythm, no murmurs, rubs or gallops, PMI not laterally displaced GI- soft, NT, ND, + BS Extremities- no clubbing, cyanosis, or edema MS- no significant deformity or atrophy Skin- no rash or lesion Psych- euthymic mood, full affect Neuro- strength and sensation are intact  EKG- NSR at 88 bpm, LAE, IRBBB, LAD, pr int 174 ms, qrs int 96 ms, qtc 457 ms Epic records reviewed    Assessment and Plan: 1. New onset typical atrial flutter General education  re atrial flutter Continue metoprolol tartrate at 50 mg bid Could potentially be a candidate at some point for atrial flutter ablation, once financial  assistance is figured out   2. CHA2DS2VASc score of 1 Continue xarelto 20 mg daily Bleeding precautions  3. Lifestyle recommendations Encouraged smoking cessation Decrease caffeine intake, no energy drinks Limit alcohol to 2 drinks a week Needs sleep study for snoring/witnessed  Apnea, currently without insurance could be very expensive, may be able to be ordered thru [employer]    Regular exercise, weight loss encouraged  CSW was in and discussed with pt re options for assistance, she referred  pt to [employer] He may be eligible for insurance thru his work and will inquire  F/u in 3 weeks  Lupita Leash C. [facility] [address] [place], [place] [address] [phone]

## 2018-09-22 NOTE — Progress Notes (Signed)
CSW met with patient in clinic to discuss insurance concerns.  Patient works full time making about $2,000/month but has not signed up for insurance through his employer- patient unsure if they offer a plan.  CSW encouraged patient to discuss insurance with employee to see if they have a plan available- if he cannot get insurance through his job CSW discussed patient going through the [employer] marketplace to get insurance- open enrollment starts November 1.  Regardless the patient would not have insurance start until January 1 so needs a bridge to get treatment until then.   Patient does not have a PCP- if patient can get into [facility] he could pursue orange or blue card to assist with paying for appointments and medications. CSW has left message with CHW to try and get an appointment for patient.  CSW also discussed [place] [facility]- patient would need $5,500 in outstanding medical bills in order to start the process- CSW provided with application and with number to call financial counselors for further information regarding the program.  CSW will continue to follow and assist as needed   H. , LCSW Clinical Social Worker [phone]   

## 2018-09-27 ENCOUNTER — Telehealth: Payer: Self-pay

## 2018-09-27 NOTE — Telephone Encounter (Signed)
Call received from Clent Demark, LCSW requesting an appointment at [facility] for the patient to establish care.  Informed her that there are appointments available at [facility]. The patient can still come to [facility] to apply for an [employer].  Jenn explained that the patient will need to schedule the appointment around his work schedule.  Provided her with the phone number for the clinic and the patient can call and schedule his appointment.

## 2018-09-28 ENCOUNTER — Telehealth (HOSPITAL_COMMUNITY): Payer: Self-pay | Admitting: Licensed Clinical Social Worker

## 2018-09-28 NOTE — Telephone Encounter (Signed)
CSW assisting with getting PCP appointment.  CSW confirmed that Primary Care at [facility], [phone], has appointments available for patients who are uninsured  CSW attempted to call patient multiple times yesterday with no answer- left message for patient today informing that he needs to call [facility] to set up initial appointment  CSW will continue to follow in clinic and assist as needed  Burna Sis, LCSW Clinical Social Worker [phone]

## 2018-10-04 ENCOUNTER — Other Ambulatory Visit: Payer: Self-pay

## 2018-10-04 ENCOUNTER — Emergency Department (HOSPITAL_COMMUNITY)
Admission: EM | Admit: 2018-10-04 | Discharge: 2018-10-04 | Disposition: A | Discharge status: Home / Self Care | Payer: Self-pay | Attending: Emergency Medicine | Admitting: Emergency Medicine

## 2018-10-04 ENCOUNTER — Telehealth (HOSPITAL_COMMUNITY): Payer: Self-pay | Admitting: Licensed Clinical Social Worker

## 2018-10-04 ENCOUNTER — Encounter (HOSPITAL_COMMUNITY): Payer: Self-pay

## 2018-10-04 ENCOUNTER — Emergency Department (HOSPITAL_COMMUNITY): Payer: Self-pay

## 2018-10-04 DIAGNOSIS — Z7901 Long term (current) use of anticoagulants: Secondary | ICD-10-CM | POA: Insufficient documentation

## 2018-10-04 DIAGNOSIS — I10 Essential (primary) hypertension: Secondary | ICD-10-CM

## 2018-10-04 DIAGNOSIS — I509 Heart failure, unspecified: Secondary | ICD-10-CM | POA: Insufficient documentation

## 2018-10-04 DIAGNOSIS — F17210 Nicotine dependence, cigarettes, uncomplicated: Secondary | ICD-10-CM | POA: Insufficient documentation

## 2018-10-04 DIAGNOSIS — I483 Typical atrial flutter: Secondary | ICD-10-CM | POA: Insufficient documentation

## 2018-10-04 DIAGNOSIS — Z79899 Other long term (current) drug therapy: Secondary | ICD-10-CM | POA: Insufficient documentation

## 2018-10-04 HISTORY — DX: Other problems related to lifestyle: Z72.89

## 2018-10-04 HISTORY — DX: Essential (primary) hypertension: I10

## 2018-10-04 HISTORY — DX: Unspecified atrial flutter: I48.92

## 2018-10-04 HISTORY — DX: Other specified health status: Z78.9

## 2018-10-04 HISTORY — DX: Heart failure, unspecified: I50.9

## 2018-10-04 HISTORY — DX: Tobacco use: Z72.0

## 2018-10-04 HISTORY — DX: Alcohol use, unspecified, uncomplicated: F10.90

## 2018-10-04 LAB — I-STAT TROPONIN, ED
Troponin i, poc: 0.04 ng/mL (ref 0.00–0.08)
Troponin i, poc: 0.01 ng/mL (ref 0.00–0.08)

## 2018-10-04 LAB — CBC
HEMATOCRIT: 41.7 % (ref 39.0–52.0)
HEMOGLOBIN: 13.5 g/dL (ref 13.0–17.0)
MCH: 32.1 pg (ref 26.0–34.0)
MCHC: 32.4 g/dL (ref 30.0–36.0)
MCV: 99.3 fL (ref 80.0–100.0)
Platelets: 259 10*3/uL (ref 150–400)
RBC: 4.2 MIL/uL — ABNORMAL LOW (ref 4.22–5.81)
RDW: 12.7 % (ref 11.5–15.5)
WBC: 6.2 10*3/uL (ref 4.0–10.5)
nRBC: 0 % (ref 0.0–0.2)

## 2018-10-04 LAB — BASIC METABOLIC PANEL
Anion gap: 7 (ref 5–15)
BUN: 21 mg/dL — ABNORMAL HIGH (ref 6–20)
CHLORIDE: 106 mmol/L (ref 98–111)
CO2: 23 mmol/L (ref 22–32)
CREATININE: 1.36 mg/dL — AB (ref 0.61–1.24)
Calcium: 8.9 mg/dL (ref 8.9–10.3)
GFR calc Af Amer: >60 mL/min (ref >60)
Glucose, Bld: 98 mg/dL (ref 70–99)
POTASSIUM: 4 mmol/L (ref 3.5–5.1)
SODIUM: 136 mmol/L (ref 135–145)

## 2018-10-04 LAB — BRAIN NATRIURETIC PEPTIDE: B NATRIURETIC PEPTIDE 5: 475.6 pg/mL — AB (ref 0.0–100.0)

## 2018-10-04 MED ORDER — METOPROLOL TARTRATE 75 MG PO TABS: 75.0000 mg | ORAL_TABLET | Freq: Two times a day (BID) | ORAL | 1 refills | Status: DC

## 2018-10-04 MED ORDER — FUROSEMIDE 20 MG PO TABS: 20.0000 mg | ORAL_TABLET | Freq: Every day | ORAL | 1 refills | Status: DC

## 2018-10-04 MED ORDER — SODIUM CHLORIDE 0.9 % IV BOLUS: 1000.0000 mL | Freq: Once | INTRAVENOUS | Status: DC

## 2018-10-04 MED ORDER — FUROSEMIDE 20 MG PO TABS
40.0000 mg | ORAL_TABLET | Freq: Once | ORAL | Status: AC
  Administered 2018-10-04: 40 mg via ORAL
  Filled 2018-10-04: qty 2

## 2018-10-04 MED ORDER — METOPROLOL TARTRATE 25 MG PO TABS
75.0000 mg | ORAL_TABLET | Freq: Once | ORAL | Status: AC
  Administered 2018-10-04: 75 mg via ORAL
  Filled 2018-10-04: qty 3

## 2018-10-04 NOTE — ED Provider Notes (Signed)
[facility] EMERGENCY DEPARTMENT Provider Note   CSN: [id number] Arrival date & time: 10/04/18  0148     History   Chief Complaint Chief Complaint  Patient presents with  . Chest Pain    HPI Antonio Tapia is a 37 y.o. male who has history of recently diagnosed atrial flutter, placed on metoprolol and Xarelto presenting for left-sided chest pain.  Patient states that the pain has been continuous for the last 2 weeks.  He describes it as a mild left-sided throbbing pain that is occasionally accompanied by palpitations.  Patient states that his pain is worsened with exertion and feels that it has slightly increased in severity in the past 2 weeks.  Patient states that he has been compliant with his Xarelto and metoprolol but for the past 2 weeks.  Of note patient is alcohol user, normally drinks 1-2 handles of alcohol weekly.  Patient states that he has an appointment at the A. fib clinic for echocardiogram tomorrow morning.  HPI  Past Medical History:  Diagnosis Date  . Alcohol use   . Atrial flutter (HCC)   . Tobacco use     There are no active problems to display for this patient.   History reviewed. No pertinent surgical history.      Home Medications    Prior to Admission medications   Medication Sig Start Date End Date Taking? Authorizing Provider  ibuprofen (ADVIL,MOTRIN) 200 MG tablet Take 600 mg by mouth every 6 (six) hours as needed (or pain or headaches).   Yes [provider]  rivaroxaban (XARELTO) 20 MG TABS tablet Take 1 tablet (20 mg total) by mouth daily with supper. 09/19/18  Yes Margarita Grizzle, MD  furosemide (LASIX) 20 MG tablet Take 1 tablet (20 mg total) by mouth daily. 10/04/18   Arty Baumgartner, NP  metoprolol tartrate 75 MG TABS Take 75 mg by mouth 2 (two) times daily. 10/04/18   Arty Baumgartner, NP    Family History History reviewed. No pertinent family history.  Social History Social History   Tobacco Use  .  Smoking status: Current Every Day Smoker  . Smokeless tobacco: Never Used  Substance Use Topics  . Alcohol use: Yes  . Drug use: No     Allergies   Patient has no known allergies.   Review of Systems Review of Systems  Constitutional: Negative.  Negative for chills and fever.  HENT: Negative.  Negative for rhinorrhea and sore throat.   Eyes: Negative.  Negative for visual disturbance.  Respiratory: Negative for cough.   Cardiovascular: Positive for chest pain and palpitations. Negative for leg swelling.  Gastrointestinal: Negative.  Negative for abdominal pain, diarrhea, nausea and vomiting.  Genitourinary: Negative.  Negative for dysuria and hematuria.  Musculoskeletal: Negative.  Negative for arthralgias and myalgias.  Skin: Negative.  Negative for rash.  Neurological: Negative.  Negative for dizziness, weakness and headaches.    Physical Exam Updated Vital Signs BP (!) 158/114 (BP Location: Right Arm)   Pulse 84   Temp 98.4 F (36.9 C) (Oral)   Resp (!) 22   SpO2 99%   Physical Exam  Constitutional: He appears well-developed and well-nourished. No distress.  HENT:  Head: Normocephalic and atraumatic.  Right Ear: External ear normal.  Left Ear: External ear normal.  Nose: Nose normal.  Eyes: Pupils are equal, round, and reactive to light. EOM are normal.  Neck: Trachea normal, normal range of motion, full passive range of motion without pain and  phonation normal. Neck supple. No tracheal deviation present.  Cardiovascular: Normal rate and regular rhythm.  Pulses:      Dorsalis pedis pulses are 2+ on the right side, and 2+ on the left side.       Posterior tibial pulses are 2+ on the right side, and 2+ on the left side.  Pulmonary/Chest: Effort normal. No respiratory distress. He has no decreased breath sounds. He exhibits no tenderness, no crepitus and no deformity.  Abdominal: Soft. There is no tenderness. There is no rigidity, no rebound and no guarding.    Musculoskeletal: Normal range of motion.       Right lower leg: Normal. He exhibits no tenderness and no edema.       Left lower leg: Normal. He exhibits no tenderness and no edema.  Neurological: He is alert. GCS eye subscore is 4. GCS verbal subscore is 5. GCS motor subscore is 6.  Speech is clear and goal oriented, follows commands Major Cranial nerves without deficit, no facial droop Normal strength in upper and lower extremities bilaterally including dorsiflexion and plantar flexion, strong and equal grip strength Sensation normal to light touch Moves extremities without ataxia, coordination intact Normal gait  Skin: Skin is warm and dry.  Psychiatric: He has a normal mood and affect. His behavior is normal.   ED Treatments / Results  Labs (all labs ordered are listed, but only abnormal results are displayed) Labs Reviewed  BASIC METABOLIC PANEL - Abnormal; Notable for the following components:      Result Value   BUN 21 (*)    Creatinine, Ser 1.36 (*)    All other components within normal limits  CBC - Abnormal; Notable for the following components:   RBC 4.20 (*)    All other components within normal limits  BRAIN NATRIURETIC PEPTIDE - Abnormal; Notable for the following components:   B Natriuretic Peptide 475.6 (*)    All other components within normal limits  I-STAT TROPONIN, ED  I-STAT TROPONIN, ED    EKG EKG Interpretation  Date/Time:  Tuesday October 04 2018 01:51:40 EDT Ventricular Rate:  82 PR Interval:  172 QRS Duration: 90 QT Interval:  414 QTC Calculation: 483 R Axis:   23 Text Interpretation:  Normal sinus rhythm Left atrial enlargement Prolonged QT Abnormal ECG No significant change since last tracing Confirmed by Tilden Fossa [phone]) on 10/04/2018 7:05:16 AM Also confirmed by Tilden Fossa [phone]), editor Elita Quick [phone])  on 10/04/2018 9:36:14 AM   Radiology Dg Chest 2 View  Result Date: 10/04/2018 CLINICAL DATA:  Chest pain.  EXAM: CHEST - 2 VIEW COMPARISON:  Radiograph 09/19/2018 FINDINGS: Unchanged cardiomegaly. Unchanged mediastinal contours. Progressive perihilar peribronchial thickening. Possible Kerley B-lines on the right. There is fluid in the fissures. No large subpulmonic effusion. No pneumothorax. No acute osseous abnormalities. IMPRESSION: Cardiomegaly unchanged from prior exam. Fluid in the fissures and suspected pulmonary edema, consistent with mild fluid overload. Electronically Signed   By: Narda Rutherford M.D.   On: 10/04/2018 02:23    Procedures Procedures (including critical care time)  Medications Ordered in ED Medications  metoprolol tartrate (LOPRESSOR) tablet 75 mg (has no administration in time range)  furosemide (LASIX) tablet 40 mg (40 mg Oral Given 10/04/18 1351)     Initial Impression / Assessment and Plan / ED Course  I have reviewed the triage vital signs and the nursing notes.  Pertinent labs & imaging results that were available during my care of the patient were reviewed by me  and considered in my medical decision making (see chart for details).  Clinical Course as of Oct 04 1432  Tue Oct 04, 2018  1610 Discussed with Dr. Madilyn Hook; Dr. Madilyn Hook to see.   [BM]  [id number] Patient seen by Dr. Madilyn Hook. Likely CHF. [place]; Dr. Madilyn Hook to call cardiology to see if they would like to follow-up tomorrow or see him today.   [BM]  1012 BNP discussed with Dr. Madilyn Hook; Cardiology Consult ordered   [BM]  1020 Consult to cardiology for admission. They will see patient in department.   [BM]  1152 Patient reevaluated resting comfortably no acute distress.  Denies pain.  Patient and his mother informed of plan of care and are agreeable.  All questions answered   [BM]  1355 Discussed case with cardiology who has seen and evaluated patient in emergency department.  Advise medication changes, metoprolol and Lasix and discharged with follow-up tomorrow morning.   [BM]    Clinical Course User Index [BM]  Bill Salinas, PA-C   Patient with new history of atrial flutter on Xarelto and metoprolol.  History of hypertension.  Today presents with increasing chest pain upon exertion that has been present for 2 weeks.  Troponin negative x2 BMP shows elevated creatinine CBC nonacute BNP of 475 EKG without significant change reviewed by Dr. Madilyn Hook Chest x-ray:  IMPRESSION:  Cardiomegaly unchanged from prior exam. Fluid in the fissures and  suspected pulmonary edema, consistent with mild fluid overload.   Afebrile, not tachycardic, not tachypneic, O2 greater than 97% on room air.  Patient is hypertensive.  Patient has been seen and evaluated by cardiology in department today.  Please see their note for full details.  In short patient to be discharged with Lasix and metoprolol with follow-up A. fib clinic tomorrow for echo.  At this time there does not appear to be any evidence of an acute emergency medical condition and the patient appears stable for discharge with appropriate outpatient follow up. Diagnosis was discussed with patient who verbalizes understanding of care plan and is agreeable to discharge. I have discussed return precautions with patient and mother who verbalize understanding of return precautions. Patient strongly encouraged to follow-up with cardiology. All questions answered.  Patient is also been seen and evaluated by Dr. Madilyn Hook who agrees with plan at this time.    Note: Portions of this report may have been transcribed using voice recognition software. Every effort was made to ensure accuracy; however, inadvertent computerized transcription errors may still be present.  Final Clinical Impressions(s) / ED Diagnoses   Final diagnoses:  Acute congestive heart failure, unspecified heart failure type [facility])    ED Discharge Orders         Ordered    furosemide (LASIX) 20 MG tablet  Daily     10/04/18 1334    metoprolol tartrate 75 MG TABS  2 times daily     10/04/18 1334            Elizabeth Palau 10/04/18 1739    Tilden Fossa, MD 10/08/18 [phone]

## 2018-10-04 NOTE — ED Notes (Signed)
Patient verbalizes understanding of discharge instructions. Opportunity for questioning and answers were provided. Armband removed by staff, pt discharged from ED.  

## 2018-10-04 NOTE — ED Notes (Signed)
Cardiology @ bedside.

## 2018-10-04 NOTE — ED Notes (Signed)
ED Provider at bedside. 

## 2018-10-04 NOTE — Telephone Encounter (Signed)
CSW received call from patients mom inquiring about available PCP information for pt without insurance- CSW provided pt mom with the name and number for Primary Care at [facility] and that they would be able to set up an appointment to discuss orange card at [facility] from there.  No further needs or concerns at this time  Burna Sis, LCSW Clinical Social Worker [facility] [phone]

## 2018-10-04 NOTE — Discharge Instructions (Addendum)
Please return to the Emergency Department for any new or worsening symptoms or if your symptoms do not improve. Please be sure to follow up with your Primary Care Physician as soon as possible regarding your visit today. If you do not have a Primary Doctor please use the resources below to establish one. Please take your new medications furosemide and metoprolol as prescribed.  Please take all of your other medications as prescribed. Please go to your cardiology appointment tomorrow morning.  Contact a health care provider if: You have a rapid weight gain. You have increasing shortness of breath that is unusual for you. You are unable to participate in your usual physical activities. You tire easily. You cough more than normal, especially with physical activity. You have any swelling or more swelling in areas such as your hands, feet, ankles, or abdomen. You are unable to sleep because it is hard to breathe. You feel like your heart is beating quickly (palpitations). You become dizzy or light-headed when you stand up. Get help right away if: You have difficulty breathing. You notice or your family notices a change in your awareness, such as having trouble staying awake or having difficulty with concentration. You have pain or discomfort in your chest. You have an episode of fainting (syncope).  Do not take your medicine if  develop an itchy rash, swelling in your mouth or lips, or difficulty breathing.   RESOURCE GUIDE  Chronic Pain Problems: Contact [facility]  [phone] Patients need to be referred by their primary care doctor.  Insufficient Money for Medicine: Contact United Way:  call "211" or [facility] 2096268432.  No Primary Care Doctor: Call [facility] Connect  [phone] - can help you locate a primary care doctor that  accepts your insurance, provides certain services, etc. Physician Referral Service- [phone]  Agencies that provide  inexpensive medical care: [facility]  [phone] [facility]  [phone] [facility]  [phone] [facility]  [phone] Planned Parenthood  [phone] [facility]  [phone]  Medicaid-accepting [facility] Providers: [facility]- [address], [address]  [phone], Mon-Fri 9am-7pm, Sat 9am-1pm [facility]- [address] [place], Suite [place]  [phone] [facility]- [address], Suite [place]  [phone] [facility]- [address]  [phone] Renaye Rakers- [address] [place], [address], [phone]  Only accepts [place] patients after they have their name  applied to their card  Self Pay (no insurance) in [facility]: Sickle Cell Patients: Dr Willey Blade, [facility] Internal Medicine  [address] [place], [phone] [facility]- [address] [place]  [phone]       [facility] [place]- [address]       -     [facility]- see information above (Speak to [employer] if you do not have insurance)       -  [facility]- [address] [place], [phone]       -  [facility]- [address] [place],  [phone]       -  [facility]- [address], [phone]       -  Dr Julio Sicks-  [address], [place], [phone]       -  [facility]- [address], [phone]       -  [facility]- [address], [phone], also [address], [phone]       -    [facility]- [address] [place], [phone], 1st & 3rd Saturday   every month, 10am-1pm  1) Find a Doctor and Pay Out of Pocket Although you won't have to find out who is covered by your insurance plan, it is a good idea to ask around and get recommendations. You will then need to call the office and see if the doctor you have chosen will accept you as a new  patient and what types of options they offer for patients who are self-pay. Some doctors offer discounts or will set up payment plans for their patients who do not have insurance, but you will need to ask so you aren't surprised when you get to your appointment.  2) Contact Your Local Health Department Not all health departments have doctors that can see patients for sick visits, but many do, so it is worth a call to see if yours does. If you don't know where your local health department is, you can check in your phone book. The CDC also has a tool to help you locate your state's health department, and many state websites also have listings of all of their local health departments.  3) Find a Walk-in Clinic If your illness is not likely to be very severe or complicated, you may want to try a walk in clinic. These are popping up all over the country in pharmacies, drugstores, and shopping centers. They're usually staffed by nurse practitioners or physician assistants that have been trained to treat common illnesses and complaints. They're usually fairly quick and inexpensive. However, if you have serious medical issues or chronic medical problems, these are probably not your best option  STD Testing [facility] Department of [facility], STD Clinic, [address] [place], phone [phone] or [phone].  Monday - Friday, call for an appointment. [facility] Department of [employer], STD Clinic, [place] [address], [place], phone [phone] or [phone].  Monday - Friday, call for an appointment.  Abuse/Neglect: [facility] Child Abuse Hotline 727-036-5880 [facility] Child Abuse Hotline 315-749-8496 (After Hours)  Emergency Shelter:  [facility] [phone]  Maternity Homes: Room at the [place] [facility] [phone] [facility] [phone]  MRSA Hotline #:   478-349-1714  [facility]  Resources  [facility]  United [employer] [facility] Dept. [address]                 [address]         [address] [place] [address]  [place]                                               Cristobal Goldmann Phone:  [phone]                                  Phone:  [phone]  Phone:  [phone]  [facility], [phone] [employer] [facility]- [phone]       -     [facility] in [place], [address],                                  [phone], [facility] Child Abuse Hotline 9173990027 or 8161134630 (After Hours)   Behavioral Health Services  Substance Abuse Resources: Alcohol and Drug Services  920-068-0097 [facility] 8452503229 [facility] [place] [phone] Floydene Flock 9070530297 Residential & Outpatient Substance Abuse Program  [phone]  Psychological Services: [facility]  [phone] [facility] Services  [phone] [facility], [phone] [place]. [address], [place], ACCESS LINE: 571-471-7984 or 902-133-4131, [employer]za  Dental Assistance  If unable to pay or uninsured, contact:  [facility] or [facility] [place]. to become qualified for the adult dental clinic.  Patients with Medicaid: [facility] [phone] [address] Qu[address], [phone] [address] [phone]  If unable to pay, or uninsured, contact [facility] [phone])[phone]) or [facility] Department [phone] in [place], [phone] in [facility]) to become qualified for the adult dental clinic   Other Low-Cost Community Dental Services: [facility]- [address] [place], [place], [place], [address], [phone], Ext. [phone], 2nd and 4th Thursday of the month at 6:30am.  10 clients each day by appointment, can sometimes  see walk-in patients if someone does not show for an appointment. [facility]- [address] [place]ins [place], [place], [address], [phone] [facility] [address] [place], [place], [address], [phone] [facility] Health Department- [phone] [facility] Health Department- [phone] [facility] [phone]

## 2018-10-04 NOTE — ED Triage Notes (Signed)
Pt from home. Per EMS pt has had recent dx of afib. Not in afib at present. Pt has has increasing chest pain with exertion for 2 weeks, worse today. Also became diaphoretic today. No pain at rest. No shob, n/v.   158/112, hx of htn.  Pt is on xarelto and metoprolol.  Fire gave 324 ASA.

## 2018-10-04 NOTE — Consult Note (Addendum)
Cardiology Consult    Patient ID: Antonio Tapia MRN: [id number], DOB/AGE: Jan 10, 1981   Admit date: 10/04/2018 Date of Consult: 10/04/2018  Primary Physician: Patient, No Pcp Per Primary Cardiologist: Rudi Coco NP (afib clinic) Requesting Provider: Dr. Madilyn Hook  Reason for Consultation: Chest pain   Antonio Tapia is a 37 y.o. male who is being seen today for the evaluation of chest pain at the request of Dr. Madilyn Hook.   Patient Profile    37 yo male with PMH of Aflutter, tobacco use and ETOH use who presented with chest pain and shortness of breath.   Past Medical History   Past Medical History:  Diagnosis Date  . Alcohol use   . Atrial flutter (HCC)   . Tobacco use     History reviewed. No pertinent surgical history.   Allergies  No Known Allergies  History of Present Illness    Antonio Tapia is a 37 yo male with PMH of Aflutter, tobacco use and ETOH use. He was seen in the ED back in Oct for new onset Aflutter and referred to the Afib clinic as an outpatient. Placed on metoprolol 50mg  BID and Xarelto. Was seen in the clinic on 09/22/18 and noted to be in SR at that time. He was continued on medications without change. It was noted that he may be a candidate for possible flutter ablation in the future once his insurance was figured out. ]  Reports since this appt he developed chest tightness at times with exertion. Reports when walking up stairs he often has to stop and rest and feels short of breath as well. Denies any LE edema, orthopnea or PND. He continues to use ETOH but is attempting to cut back on his use. This morning reports he was sleeping and awoke with shortness of breath and chest tightness prompting him to present to the ED.   In the ED his labs showed stable electrolytes, BNP 475, POC trop 0.04, Hgb 13.5. CXR with cardiomegaly unchanged from previous CXR and mild fluid overload. EKG showed SR. At the time of exam he is comfortable and laying flat in bed. No chest pain.     Inpatient Medications      Family History    History reviewed. No pertinent family history.  Social History    Social History   Socioeconomic History  . Marital status: Single    Spouse name: Not on file  . Number of children: Not on file  . Years of education: Not on file  . Highest education level: Not on file  Occupational History  . Not on file  Social Needs  . Financial resource strain: Not on file  . Food insecurity:    Worry: Not on file    Inability: Not on file  . Transportation needs:    Medical: Not on file    Non-medical: Not on file  Tobacco Use  . Smoking status: Current Every Day Smoker  . Smokeless tobacco: Never Used  Substance and Sexual Activity  . Alcohol use: Yes  . Drug use: No  . Sexual activity: Not on file  Lifestyle  . Physical activity:    Days per week: Not on file    Minutes per session: Not on file  . Stress: Not on file  Relationships  . Social connections:    Talks on phone: Not on file    Gets together: Not on file    Attends religious service: Not on file    Active member of  club or organization: Not on file    Attends meetings of clubs or organizations: Not on file    Relationship status: Not on file  . Intimate partner violence:    Fear of current or ex partner: Not on file    Emotionally abused: Not on file    Physically abused: Not on file    Forced sexual activity: Not on file  Other Topics Concern  . Not on file  Social History Narrative  . Not on file     Review of Systems    See HPI  All other systems reviewed and are otherwise negative except as noted above.  Physical Exam    Blood pressure (!) 158/114, pulse 84, temperature 98.4 F (36.9 C), temperature source Oral, resp. rate (!) 22, SpO2 99 %.  General: Pleasant, young AAM, NAD Psych: Normal affect. Neuro: Alert and oriented X 3. Moves all extremities spontaneously. HEENT: Normal  Neck: Supple, no JVD. Lungs:  Resp regular and unlabored,  CTA. Heart: RRR no murmurs. Abdomen: Soft, non-tender, non-distended, BS + x 4.  Extremities: No clubbing, cyanosis or edema. DP/PT/Radials 2+ and equal bilaterally.  Labs    Troponin [facility] of Care Test) Recent Labs    10/04/18 0213  TROPIPOC 0.04   No results for input(s): CKTOTAL, CKMB, TROPONINI in the last 72 hours. Lab Results  Component Value Date   WBC 6.2 10/04/2018   HGB 13.5 10/04/2018   HCT 41.7 10/04/2018   MCV 99.3 10/04/2018   PLT 259 10/04/2018    Recent Labs  Lab 10/04/18 0209  NA 136  K 4.0  CL 106  CO2 23  BUN 21*  CREATININE 1.36*  CALCIUM 8.9  GLUCOSE 98   No results found for: CHOL, HDL, LDLCALC, TRIG No results found for: [facility]   Radiology Studies    Dg Chest 2 View  Result Date: 10/04/2018 CLINICAL DATA:  Chest pain. EXAM: CHEST - 2 VIEW COMPARISON:  Radiograph 09/19/2018 FINDINGS: Unchanged cardiomegaly. Unchanged mediastinal contours. Progressive perihilar peribronchial thickening. Possible Kerley B-lines on the right. There is fluid in the fissures. No large subpulmonic effusion. No pneumothorax. No acute osseous abnormalities. IMPRESSION: Cardiomegaly unchanged from prior exam. Fluid in the fissures and suspected pulmonary edema, consistent with mild fluid overload. Electronically Signed   By: Narda Rutherford M.D.   On: 10/04/2018 02:23   Dg Chest Port 1 View  Result Date: 09/19/2018 CLINICAL DATA:  heart racing. Intermittent X4 days.some left chest discomfort EXAM: PORTABLE CHEST 1 VIEW COMPARISON:  01/02/2006 FINDINGS: Mild enlargement of the cardiopericardial silhouette, increased from the prior chest radiograph. No mediastinal or hilar masses. No convincing adenopathy. Clear lungs.  No pleural effusion or pneumothorax. Skeletal structures are intact. IMPRESSION: 1. No acute cardiopulmonary disease. 2. Mild cardiomegaly. Electronically Signed   By: Amie Portland M.D.   On: 09/19/2018 13:29    ECG & Cardiac Imaging    EKG:  The EKG  was personally reviewed and demonstrates SR with prolonged QT interval noted on previous.   Assessment & Plan    37 yo male with PMH of Aflutter, tobacco use and ETOH use who presented with chest pain and shortness of breath.   1. Dyspnea/Chest pain: Suspect mild volume overload likely related to recent dx of Aflutter. CXR with mild fluid overload noted, and cardiomegaly. BNP elevated at 475. No signs of significant volume overload noted on exam. Troponin 0.04 in the ED. EKG without acute changes. Comfortable without chest pain at the time of  exam.  -- check second trop. If negative would plan for discharge and follow up in the Afib clinic. -- given 40mg  oral lasix now, and plan on 20mg  oral lasix daily until his follow up in the afib clinic on 10/31.  -- has echo scheduled outpatient tomorrow and instructed to keep this.   2. Aflutter: Recent diagnosis this month and has been on metoprolol and Xarelto as an outpatient. Reports being complaint with medications. SR while in the ED and denies any recurrence of symptoms. Blood pressure has been elevated while in the ED. Will have him increase his metoprolol to 75mg  BID and follow his readings at home. Encouraged to continue to reduce his caffeine and ETOH intake as well.   3. HTN: blood pressures are elevated in the ED. Will have him further increase his metoprolol to 75mg  BID and follow BPs at home.   4. Tobacco/ETOH use: encouraged to continue to reduce intake. He is trying to quit.   Janice Coffin, NP-C Pager [phone] 10/04/2018, 1:34 PM   I have personally seen and examined this patient with Laverda Page, NP. I agree with the assessment and plan as outlined above. Antonio Tapia is a 37 yo male with history of HTN, tobacco and etoh use with recent diagnosis of atrial flutter. He has been started on Metoprolol and Xarelto. He has been seen by our EP team. He has seen Rudi Coco NP in the atrial fibrillation clinic. He is to be  followed in our EP clinic. He presents to the ED today with c/o chest tightness and dyspnea with exertion. Chest x-ray shows mild pulmonary edema. EKG reviewed by me and shows sinus rhythm with prolonged QT interval. Troponin negative. My exam: No acute distress. Lungs: mild basilar crackles  CV:RRR no murmurs Abd: soft, NT,ND  Ext: no LE edema Plan:  Chest pain: Atypical. No objective evidence of ischemia. No chest pain at rest. He has evidence of mild volume overload. Plans in place for outpatient echo tomorrow. I would give one dose of IV Lasix in the ED and if his serial troponin is negative, could discharge home today. I would begin Lasix 20 mg daily until he is seen back in follow up in our office in 9 days. His long term follow up will be in our EP clinic.   Verne Carrow 10/04/2018 2:34 PM

## 2018-10-04 NOTE — ED Notes (Signed)
Pt reports upper left chest wall pain at his shoulder that only occurs when he exerts himself. Pt currently pain free.

## 2018-10-05 ENCOUNTER — Ambulatory Visit (HOSPITAL_COMMUNITY)
Admission: RE | Admit: 2018-10-05 | Discharge: 2018-10-05 | Disposition: A | Discharge status: Home / Self Care | Payer: Self-pay | Source: Ambulatory Visit | Attending: Nurse Practitioner | Admitting: Nurse Practitioner

## 2018-10-05 ENCOUNTER — Telehealth (HOSPITAL_COMMUNITY): Payer: Self-pay | Admitting: *Deleted

## 2018-10-05 DIAGNOSIS — I081 Rheumatic disorders of both mitral and tricuspid valves: Secondary | ICD-10-CM | POA: Insufficient documentation

## 2018-10-05 DIAGNOSIS — I4891 Unspecified atrial fibrillation: Secondary | ICD-10-CM | POA: Insufficient documentation

## 2018-10-05 DIAGNOSIS — I483 Typical atrial flutter: Secondary | ICD-10-CM | POA: Insufficient documentation

## 2018-10-05 NOTE — Telephone Encounter (Addendum)
error 

## 2018-10-05 NOTE — Progress Notes (Signed)
  Echocardiogram 2D Echocardiogram has been performed.  Antonio Tapia 10/05/2018, 9:53 AM

## 2018-10-10 ENCOUNTER — Encounter: Payer: Self-pay | Admitting: Family Medicine

## 2018-10-10 ENCOUNTER — Ambulatory Visit (INDEPENDENT_AMBULATORY_CARE_PROVIDER_SITE_OTHER): Payer: Self-pay | Admitting: Family Medicine

## 2018-10-10 VITALS — BP 152/112 | HR 79 | Temp 97.5°F | Resp 17 | Ht 77.0 in | Wt 259.2 lb

## 2018-10-10 DIAGNOSIS — I509 Heart failure, unspecified: Secondary | ICD-10-CM

## 2018-10-10 DIAGNOSIS — G4730 Sleep apnea, unspecified: Secondary | ICD-10-CM

## 2018-10-10 DIAGNOSIS — Z23 Encounter for immunization: Secondary | ICD-10-CM

## 2018-10-10 DIAGNOSIS — I10 Essential (primary) hypertension: Secondary | ICD-10-CM

## 2018-10-10 DIAGNOSIS — I483 Typical atrial flutter: Secondary | ICD-10-CM

## 2018-10-10 MED ORDER — RIVAROXABAN 20 MG PO TABS: 20.0000 mg | ORAL_TABLET | Freq: Every day | ORAL | 5 refills | Status: DC

## 2018-10-10 MED ORDER — AMLODIPINE BESYLATE 5 MG PO TABS: 5.0000 mg | ORAL_TABLET | Freq: Every day | ORAL | 3 refills | Status: DC

## 2018-10-10 MED FILL — XARELTO 20 MG TABLET: 20 | 30 days supply | Qty: 30 | Fill #0

## 2018-10-10 MED FILL — AMLODIPINE BESYLATE 5 MG TA: 5 | 30 days supply | Qty: 30 | Fill #0

## 2018-10-10 NOTE — Progress Notes (Signed)
Antonio Tapia, is a 37 y.o. male  LOV:[id number]  OAC:[id number]  DOB - [birth year]  CC:  Chief Complaint  Patient presents with  . Establish Care  . Follow-up    ED on 10/22 for L sided chest pain w/occasional palpitations. states that he still has some chest pain w/exertion. denies lower extremity swelling, headache, vision changes       HPI: Antonio Tapia is a 37 y.o. male is here today to establish care.   Antonio Tapia has Acute congestive heart failure (HCC); Typical atrial flutter (HCC); and Essential hypertension on their problem list.    Today's visit:  ER follow-up x 2 visit. Patient is a current every day smoker, light drinker, with a current Body mass index is 30.74 kg/m.   09/19/2018-ER follow-up Patient initially presented to Primary Care at [facility] with a complaint of rapid heart rate and chest pain. EKG was significant Tachycardia Rate 184. He was transported via EMS to [facility] and there he was found to be in Atrial flutter.  He was started on metoprolol and anticoagulation  therapy initiated with Xarelto. He was referred to Atrial Fib clinic and established care there on 09/22/2018. Of note his BP was elevated at the ER and Atrial Fib clinic. He was continued on metoprolol therapy for rate control -Xarelto given CHA2DS2VASc score of 1.   10/04/2018- ER follow-up  Patient presented again to [facility] with a complaint of left sided chest pain with palpitations. Chest x-ray was significant for possible pulmonary edema and cardiomegaly with fluid overload. Recent Echocardiogram confirmed HF. Discharged with increased dose metoprolol and lasix. He followed up the following day and had an echocardiogram completed. Echocardiogram significant for reduced systolic function, regurgitation noted mitral, tricuspid, and pulmonic valve. Pulmonary artery pressure elevated which was significant for pulmonary hypertension. Cardiology has referred him to the heart failure clinic.  Today he reports  occasional chest discomfort. Blood pressure remains elevated. He is complaint with medications. Of note patient reports that he has been told that he stops breathing during night-time rest and snoring. No prior diagnosis of sleep apnea or sleep study. No prior issues with hypertension or cardiovascular disease. He is uninsured. He has been able to obtain medication.  He is a current daily smoker with occasional alcohol use.  Patient denies new headaches, abdominal pain, nausea, new weakness , numbness or tingling, SOB, edema, or worrisome cough.   Current medications: Current Outpatient Medications:  .  furosemide (LASIX) 20 MG tablet, Take 1 tablet (20 mg total) by mouth daily., Disp: 30 tablet, Rfl: 1 .  ibuprofen (ADVIL,MOTRIN) 200 MG tablet, Take 600 mg by mouth every 6 (six) hours as needed (or pain or headaches)., Disp: , Rfl:  .  metoprolol tartrate 75 MG TABS, Take 75 mg by mouth 2 (two) times daily., Disp: 60 tablet, Rfl: 1 .  rivaroxaban (XARELTO) 20 MG TABS tablet, Take 1 tablet (20 mg total) by mouth daily with supper., Disp: 30 tablet, Rfl: 0   Pertinent family medical history: family history includes Breast cancer in his paternal grandfather; Diabetes in his paternal grandmother; Hypertension in his father and mother.   No Known Allergies  Social History   Socioeconomic History  . Marital status: Single    Spouse name: Not on file  . Number of children: Not on file  . Years of education: Not on file  . Highest education level: Not on file  Occupational History  . Not on file  Social Needs  . Physicist, medical  strain: Not on file  . Food insecurity:    Worry: Not on file    Inability: Not on file  . Transportation needs:    Medical: Not on file    Non-medical: Not on file  Tobacco Use  . Smoking status: Current Every Day Smoker  . Smokeless tobacco: Never Used  Substance and Sexual Activity  . Alcohol use: Yes  . Drug use: No  . Sexual activity: Not on file   Lifestyle  . Physical activity:    Days per week: Not on file    Minutes per session: Not on file  . Stress: Not on file  Relationships  . Social connections:    Talks on phone: Not on file    Gets together: Not on file    Attends religious service: Not on file    Active member of club or organization: Not on file    Attends meetings of clubs or organizations: Not on file    Relationship status: Not on file  . Intimate partner violence:    Fear of current or ex partner: Not on file    Emotionally abused: Not on file    Physically abused: Not on file    Forced sexual activity: Not on file  Other Topics Concern  . Not on file  Social History Narrative  . Not on file    Review of Systems: Constitutional: Negative for fever, chills, diaphoresis, activity change, appetite change and fatigue. HENT: Negative for ear pain, nosebleeds, congestion, facial swelling, rhinorrhea, neck pain, neck stiffness and ear discharge.  Eyes: Negative for pain, discharge, redness, itching and visual disturbance. Respiratory: Shortness of breath with exertion. Negative wheezing and stridor.  Cardiovascular: Positive for chest pain, palpitations. Negative  leg swelling. Gastrointestinal: Negative for abdominal distention. Musculoskeletal: Negative for back pain, joint swelling, arthralgia and gait problem. Neurological: Negative for dizziness, tremors, seizures, syncope, facial asymmetry, speech difficulty, weakness, light-headedness, numbness and headaches.  Hematological: Negative for adenopathy. Does not bruise/bleed easily. Psychiatric/Behavioral: Negative for hallucinations, behavioral problems, confusion, dysphoric mood, decreased concentration and agitation.  Objective:   Vitals:   10/10/18 1511  BP: (!) 152/112  Pulse: 79  Resp: 17  Temp: (!) 97.5 F (36.4 C)  SpO2: 96%    BP Readings from Last 3 Encounters:  10/10/18 (!) 152/112  10/04/18 (!) 137/108  09/22/18 (!) 158/110     Filed Weights   10/10/18 1511  Weight: 259 lb 3.2 oz (117.6 kg)      Physical Exam: Constitutional: Patient appears well-developed and well-nourished. No distress. HENT: Normocephalic, atraumatic, External right and left ear normal. Oropharynx is clear and moist.  Eyes: Conjunctivae and EOM are normal. PERRLA, no scleral icterus. Neck: Normal ROM. Neck supple. No JVD. No tracheal deviation. No thyromegaly. CVS: Regular rate , Irregular rhythm +, no murmurs, no gallops, no carotid bruit.  Pulmonary: Effort and breath sounds normal, no stridor, rhonchi, wheezes, rales.  Abdominal: Soft. BS +, no distension, tenderness, rebound or guarding.  Musculoskeletal: Normal range of motion. No edema and no tenderness.  Neuro: Alert. Normal muscle tone coordination. Normal gait. BUE and BLE strength 5/5.  Bilateral hand grips symmetrical. Skin: Skin is warm and dry. No rash noted. Not diaphoretic. No erythema. No pallor. Psychiatric: Normal mood and affect. Behavior, judgment, thought content normal.  Lab Results (prior encounters)  Lab Results  Component Value Date   WBC 6.2 10/04/2018   HGB 13.5 10/04/2018   HCT 41.7 10/04/2018   MCV 99.3 10/04/2018  PLT 259 10/04/2018   Lab Results  Component Value Date   CREATININE 1.36 (H) 10/04/2018   BUN 21 (H) 10/04/2018   NA 136 10/04/2018   K 4.0 10/04/2018   CL 106 10/04/2018   CO2 23 10/04/2018      Assessment and plan:  1. Need for immunization against influenza - Flu Vaccine QUAD 36+ mos IM  2. Typical atrial flutter (HCC), rate controlled -Continue Xarelto  -Continue metoprolol 75 mg BID  3. Essential hypertension, uncontrolled today   -start amlodipine 5 mg and continue metoprolol We have discussed target BP range and blood pressure goal. I have advised patient to check BP regularly and to call us back or report to clinic if the numbers are consistently higher than 140/90. We discussed the importance of compliance with medical  therapy and DASH diet recommended, consequences of uncontrolled hypertension discussed.   4. Acute congestive heart failure, unspecified heart failure type (HCC) -Asymptomatic today -Continue Lasix -Continue metoprolol     5. Sleep apnea, unspecified type - Split night study   Meds ordered this encounter  Medications  . amLODipine (NORVASC) 5 MG tablet    Sig: Take 1 tablet (5 mg total) by mouth daily.    Dispense:  90 tablet    Refill:  3  . rivaroxaban (XARELTO) 20 MG TABS tablet    Sig: Take 1 tablet (20 mg total) by mouth daily with supper.    Dispense:  30 tablet    Refill:  5    Orders Placed This Encounter  Procedures  . Flu Vaccine QUAD 36+ mos IM  . Split night study    Standing Status:   Future    Standing Expiration Date:   10/11/2019    Order Specific Question:   Where should this test be performed:    Answer:   [facility]    Return in about 3 weeks (around 10/31/2018) for 3 weeks chronic condition and tomorrow lab.   A total of 40 minutes spent, greater than 50 % of this time was spent counseling and coordination of care.  The patient was given clear instructions to go to ER or return to medical center if symptoms don't improve, worsen or new problems develop. The patient verbalized understanding. The patient was advised  to call and obtain lab results if they haven't heard anything from out office within 7-10 business days.  [name], FNP Primary Care at [facility] [address] [place] [address] [phone]: [phone]    This note has been created with Dragon speech recognition software and Paediatric nurse. Any transcriptional errors are unintentional.

## 2018-10-10 NOTE — Patient Instructions (Signed)
Thank you for choosing Primary Care at [facility] for your medical home!    Antonio Tapia was seen by [name], FNP today.   Antonio Tapia's primary care provider is [name], FNP.   For the best care possible,  you should try to see [name], FNP whenever you come to clinic.   We look forward to seeing you again soon!  If you have any questions about your visit today,  please call us at   Or feel free to reach your provider via MyChart.       Hypertension Hypertension is another name for high blood pressure. High blood pressure forces your heart to work harder to pump blood. This can cause problems over time. There are two numbers in a blood pressure reading. There is a top number (systolic) over a bottom number (diastolic). It is best to have a blood pressure below 120/80. Healthy choices can help lower your blood pressure. You may need medicine to help lower your blood pressure if:  Your blood pressure cannot be lowered with healthy choices.  Your blood pressure is higher than 130/80.  Follow these instructions at home: Eating and drinking  If directed, follow the DASH eating plan. This diet includes: ? Filling half of your plate at each meal with fruits and vegetables. ? Filling one quarter of your plate at each meal with whole grains. Whole grains include whole wheat pasta, brown rice, and whole grain bread. ? Eating or drinking low-fat dairy products, such as skim milk or low-fat yogurt. ? Filling one quarter of your plate at each meal with low-fat (lean) proteins. Low-fat proteins include fish, skinless chicken, eggs, beans, and tofu. ? Avoiding fatty meat, cured and processed meat, or chicken with skin. ? Avoiding premade or processed food.  Eat less than 1,500 mg of salt (sodium) a day.  Limit alcohol use to no more than 1 drink a day for nonpregnant women and 2 drinks a day for men. One drink equals 12 oz of beer, 5 oz of wine, or 1 oz of  hard liquor. Lifestyle  Work with your doctor to stay at a healthy weight or to lose weight. Ask your doctor what the best weight is for you.  Get at least 30 minutes of exercise that causes your heart to beat faster (aerobic exercise) most days of the week. This may include walking, swimming, or biking.  Get at least 30 minutes of exercise that strengthens your muscles (resistance exercise) at least 3 days a week. This may include lifting weights or pilates.  Do not use any products that contain nicotine or tobacco. This includes cigarettes and e-cigarettes. If you need help quitting, ask your doctor.  Check your blood pressure at home as told by your doctor.  Keep all follow-up visits as told by your doctor. This is important. Medicines  Take over-the-counter and prescription medicines only as told by your doctor. Follow directions carefully.  Do not skip doses of blood pressure medicine. The medicine does not work as well if you skip doses. Skipping doses also puts you at risk for problems.  Ask your doctor about side effects or reactions to medicines that you should watch for. Contact a doctor if:  You think you are having a reaction to the medicine you are taking.  You have headaches that keep coming back (recurring).  You feel dizzy.  You have swelling in your ankles.  You have trouble with your vision. Get help right  away if:  You get a very bad headache.  You start to feel confused.  You feel weak or numb.  You feel faint.  You get very bad pain in your: ? Chest. ? Belly (abdomen).  You throw up (vomit) more than once.  You have trouble breathing. Summary  Hypertension is another name for high blood pressure.  Making healthy choices can help lower blood pressure. If your blood pressure cannot be controlled with healthy choices, you may need to take medicine. This information is not intended to replace advice given to you by your health care provider. Make  sure you discuss any questions you have with your health care provider. Document Released: 05/18/2008 Document Revised: 10/28/2016 Document Reviewed: 10/28/2016 Elsevier Interactive Patient Education  2018 Elsevier Inc.   Sleep Apnea Sleep apnea is a condition that affects breathing. People with sleep apnea have moments during sleep when their breathing pauses briefly or gets shallow. Sleep apnea can cause these symptoms:  Trouble staying asleep.  Sleepiness or tiredness during the day.  Irritability.  Loud snoring.  Morning headaches.  Trouble concentrating.  Forgetting things.  Less interest in sex.  Being sleepy for no reason.  Mood swings.  Personality changes.  Depression.  Waking up a lot during the night to pee (urinate).  Dry mouth.  Sore throat.  Follow these instructions at home:  Make any changes in your routine that your doctor recommends.  Eat a healthy, well-balanced diet.  Take over-the-counter and prescription medicines only as told by your doctor.  Avoid using alcohol, calming medicines (sedatives), and narcotic medicines.  Take steps to lose weight if you are overweight.  If you were given a machine (device) to use while you sleep, use it only as told by your doctor.  Do not use any tobacco products, such as cigarettes, chewing tobacco, and e-cigarettes. If you need help quitting, ask your doctor.  Keep all follow-up visits as told by your doctor. This is important. Contact a doctor if:  The machine that you were given to use during sleep is uncomfortable or does not seem to be working.  Your symptoms do not get better.  Your symptoms get worse. Get help right away if:  Your chest hurts.  You have trouble breathing in enough air (shortness of breath).  You have an uncomfortable feeling in your back, arms, or stomach.  You have trouble talking.  One side of your body feels weak.  A part of your face is hanging down  (drooping). These symptoms may be an emergency. Do not wait to see if the symptoms will go away. Get medical help right away. Call your local emergency services (911 in the [place] Do not drive yourself to the hospital. This information is not intended to replace advice given to you by your health care provider. Make sure you discuss any questions you have with your health care provider. Document Released: 09/08/2008 Document Revised: 07/26/2016 Document Reviewed: 09/09/2015 Elsevier Interactive Patient Education  [employer].

## 2018-10-11 ENCOUNTER — Other Ambulatory Visit: Payer: Self-pay

## 2018-10-12 ENCOUNTER — Ambulatory Visit: Payer: Self-pay | Attending: Family Medicine

## 2018-10-12 ENCOUNTER — Ambulatory Visit: Payer: Self-pay | Admitting: Family Medicine

## 2018-10-12 DIAGNOSIS — I10 Essential (primary) hypertension: Secondary | ICD-10-CM

## 2018-10-12 DIAGNOSIS — I110 Hypertensive heart disease with heart failure: Secondary | ICD-10-CM | POA: Insufficient documentation

## 2018-10-12 DIAGNOSIS — I509 Heart failure, unspecified: Secondary | ICD-10-CM | POA: Insufficient documentation

## 2018-10-12 NOTE — Addendum Note (Signed)
Addended by: [name] on: 10/12/2018 07:28 AM   Modules accepted: Orders

## 2018-10-12 NOTE — Addendum Note (Signed)
Addended by: [name] on: 10/12/2018 01:54 PM   Modules accepted: Orders

## 2018-10-13 ENCOUNTER — Ambulatory Visit (HOSPITAL_COMMUNITY): Payer: Self-pay | Admitting: Nurse Practitioner

## 2018-10-13 LAB — COMPREHENSIVE METABOLIC PANEL
ALBUMIN: 4.3 g/dL (ref 3.5–5.5)
ALK PHOS: 72 IU/L (ref 39–117)
ALT: 28 IU/L (ref 0–44)
AST: 34 IU/L (ref 0–40)
Albumin/Globulin Ratio: 1.3 (ref 1.2–2.2)
BUN / CREAT RATIO: 14 (ref 9–20)
BUN: 16 mg/dL (ref 6–20)
Bilirubin Total: 0.5 mg/dL (ref 0.0–1.2)
CHLORIDE: 98 mmol/L (ref 96–106)
CO2: 25 mmol/L (ref 20–29)
CREATININE: 1.18 mg/dL (ref 0.76–1.27)
Calcium: 9.1 mg/dL (ref 8.7–10.2)
GFR calc Af Amer: 91 mL/min/{1.73_m2} (ref >59)
GFR calc non Af Amer: 78 mL/min/{1.73_m2} (ref >59)
GLUCOSE: 81 mg/dL (ref 65–99)
Globulin, Total: 3.3 g/dL (ref 1.5–4.5)
Potassium: 4.3 mmol/L (ref 3.5–5.2)
Sodium: 139 mmol/L (ref 134–144)
Total Protein: 7.6 g/dL (ref 6.0–8.5)

## 2018-10-13 LAB — HEMOGLOBIN A1C
ESTIMATED AVERAGE GLUCOSE: 120 mg/dL
HEMOGLOBIN A1C: 5.8 % — AB (ref 4.8–5.6)

## 2018-10-13 LAB — BRAIN NATRIURETIC PEPTIDE: BNP: 261.7 pg/mL — AB (ref 0.0–100.0)

## 2018-10-18 ENCOUNTER — Inpatient Hospital Stay (HOSPITAL_COMMUNITY)
Admission: RE | Admit: 2018-10-18 | Discharge: 2018-10-18 | Disposition: A | Discharge status: Home / Self Care | Payer: Self-pay | Source: Ambulatory Visit

## 2018-10-20 NOTE — Progress Notes (Signed)
Patient notified of results & recommendations. Expressed understanding.

## 2018-10-27 ENCOUNTER — Encounter (HOSPITAL_COMMUNITY): Payer: Self-pay

## 2018-10-27 ENCOUNTER — Other Ambulatory Visit: Payer: Self-pay

## 2018-10-27 ENCOUNTER — Ambulatory Visit (HOSPITAL_COMMUNITY)
Admission: RE | Admit: 2018-10-27 | Discharge: 2018-10-27 | Disposition: A | Discharge status: Home / Self Care | Payer: Self-pay | Source: Ambulatory Visit | Attending: Cardiology | Admitting: Cardiology

## 2018-10-27 VITALS — BP 156/98 | HR 90 | Wt 261.2 lb

## 2018-10-27 DIAGNOSIS — I5022 Chronic systolic (congestive) heart failure: Secondary | ICD-10-CM | POA: Insufficient documentation

## 2018-10-27 DIAGNOSIS — I480 Paroxysmal atrial fibrillation: Secondary | ICD-10-CM | POA: Insufficient documentation

## 2018-10-27 DIAGNOSIS — F1010 Alcohol abuse, uncomplicated: Secondary | ICD-10-CM | POA: Insufficient documentation

## 2018-10-27 DIAGNOSIS — Z79899 Other long term (current) drug therapy: Secondary | ICD-10-CM | POA: Insufficient documentation

## 2018-10-27 DIAGNOSIS — I10 Essential (primary) hypertension: Secondary | ICD-10-CM

## 2018-10-27 DIAGNOSIS — F17200 Nicotine dependence, unspecified, uncomplicated: Secondary | ICD-10-CM

## 2018-10-27 DIAGNOSIS — I110 Hypertensive heart disease with heart failure: Secondary | ICD-10-CM | POA: Insufficient documentation

## 2018-10-27 DIAGNOSIS — I483 Typical atrial flutter: Secondary | ICD-10-CM

## 2018-10-27 DIAGNOSIS — Z7901 Long term (current) use of anticoagulants: Secondary | ICD-10-CM | POA: Insufficient documentation

## 2018-10-27 DIAGNOSIS — I509 Heart failure, unspecified: Secondary | ICD-10-CM | POA: Insufficient documentation

## 2018-10-27 DIAGNOSIS — F17210 Nicotine dependence, cigarettes, uncomplicated: Secondary | ICD-10-CM | POA: Insufficient documentation

## 2018-10-27 DIAGNOSIS — Z8249 Family history of ischemic heart disease and other diseases of the circulatory system: Secondary | ICD-10-CM | POA: Insufficient documentation

## 2018-10-27 LAB — TSH: TSH: 1.225 u[IU]/mL (ref 0.350–4.500)

## 2018-10-27 MED ORDER — SACUBITRIL-VALSARTAN 24-26 MG PO TABS: 1.0000 | ORAL_TABLET | Freq: Two times a day (BID) | ORAL | 3 refills | Status: DC

## 2018-10-27 MED ORDER — CARVEDILOL 12.5 MG PO TABS: 12.5000 mg | ORAL_TABLET | Freq: Two times a day (BID) | ORAL | 11 refills | Status: DC

## 2018-10-27 MED FILL — ENTRESTO 24 MG-26 MG TABLET: 24-26 | 30 days supply | Qty: 60 | Fill #0

## 2018-10-27 MED FILL — CARVEDILOL 12.5 MG TABLET: 12.5 | 30 days supply | Qty: 60 | Fill #0

## 2018-10-27 NOTE — Progress Notes (Signed)
ADVANCED HF CLINIC CONSULT NOTE  Referring Physician: Rudi Coco NP  Primary Care: [name] NP  Primary Cardiologist:  HPI: Mr Gencarelli is a 37 year old referred to HF clinic by Sebastian Ache NP for HF consultation.    Mr Brandis is a 37 year old with a history of ETOH abuse, A flutter, tobacco abuse, ETOH abuse 4 drinks a day, and newly reduced EF. Denies cocaine   He was evaluated in [facility] ED on 09/19/18 with palpitations. He had been having palpitations 4-5 days prior to visit.. EKG showed A flutter RVR with rate in the 130s. He spontaneously converted and discharged on lopressor and xarelto. He was referred to A fib clinic and was seen on 09/22/18. At that time he was continued on lopressor. Sleep study was recommended but not completed because he does not have insurance.   He returned to the [facility] ED on 10/22 with chest pain. Troponin was negative. Cardiology was consulted. He has mild volume overload and started on po lasix. On 10/23 he had an ECHO that showed reduced EF 30-35%. He was referred to HF clinic.   Today he presents as a new patient. Overall feeling much better.  Denies SOB/PND/Orthopnea. Appetite ok. No fever or chills. He has not been weighing at home.  Taking all medications.   SH: Smokes 1/2 PPD but has cut back, drinks at least 4 drinks a day, works as Leisure centre manager. Takes the bus to appointments. He has difficulty paying for medications.   FH: no family of CAD or heart failure.   ECHO 10/05/2018  Right ventricular systolic presure 53 mmHg, suggesting at least   moderate pulmonary hypertension. Reduced LV ejection fraction   30-35% with global hypokinesis.  Review of Systems: [y] = yes, [ ]  = no   General: Weight gain [ ] ; Weight loss [ ] ; Anorexia [ ] ; Fatigue [ ] ; Fever [ ] ; Chills [ ] ; Weakness [ ]   Cardiac: Chest pain/pressure [ ] ; Resting SOB [ ] ; Exertional SOB [ ] ; Orthopnea [ ] ; Pedal Edema [ ] ; Palpitations [ ] ; Syncope [ ] ; Presyncope [ ] ; Paroxysmal  nocturnal dyspnea[ ]   Pulmonary: Cough [ ] ; Wheezing[ ] ; Hemoptysis[ ] ; Sputum [ ] ; Snoring [ ]   GI: Vomiting[ ] ; Dysphagia[ ] ; Melena[ ] ; Hematochezia [ ] ; Heartburn[ ] ; Abdominal pain [ ] ; Constipation [ ] ; Diarrhea [ ] ; BRBPR [ ]   GU: Hematuria[ ] ; Dysuria [ ] ; Nocturia[ ]   Vascular: Pain in legs with walking [ ] ; Pain in feet with lying flat [ ] ; Non-healing sores [ ] ; Stroke [ ] ; TIA [ ] ; Slurred speech [ ] ;  Neuro: Headaches[ ] ; Vertigo[ ] ; Seizures[ ] ; Paresthesias[ ] ;Blurred vision [ ] ; Diplopia [ ] ; Vision changes [ ]   Ortho/Skin: Arthritis [ ] ; Joint pain [ Y]; Muscle pain [ ] ; Joint swelling [ ] ; Back Pain [Y ]; Rash [ ]   Psych: Depression[ ] ; Anxiety[ ]   Heme: Bleeding problems [ ] ; Clotting disorders [ ] ; Anemia [ ]   Endocrine: Diabetes [ ] ; Thyroid dysfunction[ ]    Past Medical History:  Diagnosis Date  . Acute congestive heart failure (HCC)   . Alcohol use   . Atrial flutter (HCC)   . Essential hypertension   . Tobacco use     Current Outpatient Medications  Medication Sig Dispense Refill  . amLODipine (NORVASC) 5 MG tablet Take 1 tablet (5 mg total) by mouth daily. 90 tablet 3  . furosemide (LASIX) 20 MG tablet Take 1 tablet (20 mg total)  by mouth daily. 30 tablet 1  . metoprolol tartrate 75 MG TABS Take 75 mg by mouth 2 (two) times daily. 60 tablet 1  . rivaroxaban (XARELTO) 20 MG TABS tablet Take 1 tablet (20 mg total) by mouth daily with supper. 30 tablet 0   No current facility-administered medications for this encounter.     No Known Allergies    Social History   Socioeconomic History  . Marital status: Single    Spouse name: Not on file  . Number of children: Not on file  . Years of education: Not on file  . Highest education level: Not on file  Occupational History  . Not on file  Social Needs  . Financial resource strain: Not on file  . Food insecurity:    Worry: Not on file    Inability: Not on file  . Transportation needs:    Medical: Not on  file    Non-medical: Not on file  Tobacco Use  . Smoking status: Current Every Day Smoker  . Smokeless tobacco: Never Used  Substance and Sexual Activity  . Alcohol use: Yes  . Drug use: No  . Sexual activity: Not on file  Lifestyle  . Physical activity:    Days per week: Not on file    Minutes per session: Not on file  . Stress: Not on file  Relationships  . Social connections:    Talks on phone: Not on file    Gets together: Not on file    Attends religious service: Not on file    Active member of club or organization: Not on file    Attends meetings of clubs or organizations: Not on file    Relationship status: Not on file  . Intimate partner violence:    Fear of current or ex partner: Not on file    Emotionally abused: Not on file    Physically abused: Not on file    Forced sexual activity: Not on file  Other Topics Concern  . Not on file  Social History Narrative  . Not on file      Family History  Problem Relation Age of Onset  . Hypertension Mother   . Hypertension Father   . Diabetes Paternal Grandmother   . Breast cancer Paternal Grandfather     Vitals:   10/27/18 1447  BP: (!) 156/98  Pulse: 90  SpO2: 99%  Weight: 118.5 kg (261 lb 3.2 oz)    PHYSICAL EXAM: General:  Well appearing. No respiratory difficulty HEENT: normal Neck: supple. no JVD. Carotids 2+ bilat; no bruits. No lymphadenopathy or thryomegaly appreciated. Cor: PMI nondisplaced. Regular rate & rhythm. No rubs, gallops or murmurs. Lungs: clear Abdomen: soft, nontender, nondistended. No hepatosplenomegaly. No bruits or masses. Good bowel sounds. Extremities: no cyanosis, clubbing, rash, edema Neuro: alert & oriented x 3, cranial nerves grossly intact. moves all 4 extremities w/o difficulty. Affect pleasant.  ECG: NSR 74 bpm personally reviewed    ASSESSMENT & PLAN:  1. Chronic Systolic HF Newly diagnosed October 2019. He has not had an ischemic work up. Troponin negative in  October. No chest pain. Does not recall virus. Suspect NICM but if he has recurrent chest tightness he will need cath. 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  Check TSH today. Plan to repeat ECHO after HF meds optimized. --> End of January.  NYHA II. Volume status stable. Stop lasix and change to 20 mg lasix as needed to 3 pound weight gain.  -  Start entresto 24-26 mg twice a day.  - Stop lopressor. Start carvedilol 12.5 mg twice a day - Next visit consider spironolactone.  - Check BMET next visit.   2. HTN Stop amlodipine.  Elevated. As above adding entresto.   3. PAF Recent A flutter. Spontaneously converted. In NSR today.  -Stop lopressor with reduced ef. Start carvedilol  - Continue xarelto 20 mg daily  Check TSH today.   4. 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 Encouraged him to stop drinking alcohol.   5. Tobacco Abuse Discussed smoking cessation.   Follow in 2 week with pharmacy, 4 week with APP, 6 weeks with pharmacy, and 10-12  weeks with Dr Gala Romney with an ECHO.  Discussed at length medications and ECHO.   Today we sent HF meds to outpatient pharmacy.   Jahvon Gosline NP-C  4:44 PM

## 2018-10-27 NOTE — Patient Instructions (Signed)
STOP Lopressor START Coreg 12.5 mg, one tab twice a day START Entresto 24/26 mg, one tab twice a day STOP Lasix   Labs today We will only contact you if something comes back abnormal or we need to make some changes. Otherwise no news is good news!  Your physician recommends that you schedule a follow-up appointment in: 2 weeks with the CHF pharmacist, 4 weeks  in the [facility], and 10 weeks with Dr Gala Romney and echo  Your physician has requested that you have an echocardiogram. Echocardiography is a painless test that uses sound waves to create images of your heart. It provides your doctor with information about the size and shape of your heart and how well your heart's chambers and valves are working. This procedure takes approximately one hour. There are no restrictions for this procedure.   Do the following things EVERYDAY: 1) Weigh yourself in the morning before breakfast. Write it down and keep it in a log. 2) Take your medicines as prescribed 3) Eat low salt foods-Limit salt (sodium) to 2000 mg per day.  4) Stay as active as you can everyday 5) Limit all fluids for the day to less than 2 liters

## 2018-10-27 NOTE — Progress Notes (Signed)
CSW consulted to meet with pt and pt mom in clinic.  Pt with questions about orange card through [facility].  Pt and mom had heard that orange card would pay retroactively for past bills- CSW informed pt and mom that I had not heard of that feature but that they should follow up with the financial counselor at [facility] when they submit the paperwork.  They were also having issues getting necessary documentation from the IRS and inquired if there was anything else they could be trying- CSW to investigate and update pt and mom  CSW will continue to follow in clinic and assist as needed  Burna Sis, LCSW Clinical Social Worker [facility] [phone]

## 2018-10-28 ENCOUNTER — Telehealth (HOSPITAL_COMMUNITY): Payer: Self-pay | Admitting: Licensed Clinical Social Worker

## 2018-10-28 NOTE — Telephone Encounter (Signed)
CSW called pt and pt mom to follow up regarding getting IRS documentation to complete orange card application.  Per CHW care coordinator quickest route to getting needed documentation would be through IRS website where you can request documents online.  CSW relayed this information to pt and pt mom- they will attempt to complete this process.  CSW will continue to follow in clinic and assist as needed  Burna Sis, LCSW Clinical Social Worker [facility] [phone]

## 2018-11-02 ENCOUNTER — Ambulatory Visit: Payer: Self-pay | Admitting: Family Medicine

## 2018-11-09 ENCOUNTER — Ambulatory Visit: Payer: Self-pay | Admitting: Family Medicine

## 2018-11-15 NOTE — Progress Notes (Signed)
HF MD: BENSIMHON  HPI:  Mr Meno is a 37 year old referred to HF clinic by Sebastian Ache NP for HF consultation. He has a history of ETOH abuse, A flutter, tobacco abuse, ETOH abuse 4 drinks a day, and newly reduced EF. Denies cocaine   He was evaluated in [facility] ED on 09/19/18 with palpitations. He had been having palpitations 4-5 days prior to visit.. EKG showed A flutter RVR with rate in the 130s. He spontaneously converted and discharged on lopressor and xarelto. He was referred to A fib clinic and was seen on 09/22/18. At that time he was continued on lopressor. Sleep study was recommended but not completed because he does not have insurance.   He returned to the [facility] ED on 10/22 with chest pain. Troponin was negative. Cardiology was consulted. He has mild volume overload and started on po lasix. On 10/23 he had an ECHO that showed reduced EF 30-35%. He was referred to HF clinic.   Today he presents with his mother for pharmacist-led HF medication titration. At last HF clinic visit on 11/14, his amlodipine and lopressor were discontinued and carvedilol 12.5 mg BID and Entresto 24-26 mg BID were started. Overall feeling much better.  Denies SOB/PND/Orthopnea. Appetite ok. No fever or chills. Taking all medications.     . Shortness of breath/dyspnea on exertion? no  . Orthopnea/PND? no . Edema? no . Lightheadedness/dizziness? no . Daily weights at home? Yes - stable between 258-261 lb . Blood pressure/heart rate monitoring at home? No - has BP monitor but not recording yet . Following low-sodium/fluid-restricted diet? Yes  HF Medications: Carvedilol 12.5 mg PO BID Entresto 24-26 mg PO BID  Has the patient been experiencing any side effects to the medications prescribed?  no  Does the patient have any problems obtaining medications due to transportation or finances?   Yes - No Rx insurance - [place], CSW, working with patient on this  Understanding of regimen: good Understanding of  indications: good Potential of compliance: good Patient understands to avoid NSAIDs. Patient understands to avoid decongestants.    Pertinent Lab Values: . 11/15/18: Serum creatinine 0.95, BUN 15, Potassium 3.9, Sodium 136  Vital Signs: . Weight: 265 lb (dry weight: 260 lb) . Blood pressure: 170/92 mmHg  . Heart rate: 80 bpm    ReDS Vest: 40%  Assessment: 1. 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 NYHA class II symptoms.  - Volume status slightly elevated based on weight gain and ReDS vest reading (although this may be falsely elevated d/t larger chest size)  - Increase Entresto to 49-51 mg BID  - Continue carvedilol 12.5 mg BID  - Discussed eventual addition of spironolactone and Bidil  - Filled out patient assistance paperwork for Entresto   - Basic disease state pathophysiology, medication indication, mechanism and side effects reviewed at length with patient and he verbalized understanding  2. HTN  - Elevated above goal of <130/80 mmHg  - Increase Entresto as above  3. PAF - Recent A flutter. Spontaneously converted. In NSR today.  - Continue xarelto 20 mg daily  - Filled out patient assistance paperwork for Xarelto  4. ETOH abuse - Possible ETOH induced. Encouraged him to stop drinking alcohol.   5. Tobacco Abuse  - Discussed smoking cessation since still smoking 3-4 cigarettes per day (down from 1 ppd)  - Will have him call the [place] Quit Line to get started on nicotine patches at no cost to him  Plan: 1) Medication changes: Based on clinical presentation, vital signs and recent labs will increase Entresto to 49-51 mg BID 2) Labs: BMET today and at next clinic visit 3) Follow-up: APP clinic on 11/24/18 and Dr. Gala Romney with ECHO on 01/11/19   Tyler Deis. Bonnye Fava, PharmD, BCPS, CPP Clinical Pharmacist Phone: [phone] 11/15/2018 11:45 AM

## 2018-11-16 ENCOUNTER — Ambulatory Visit (HOSPITAL_COMMUNITY)
Admission: RE | Admit: 2018-11-16 | Discharge: 2018-11-16 | Disposition: A | Discharge status: Home / Self Care | Payer: Self-pay | Source: Ambulatory Visit | Attending: Internal Medicine | Admitting: Internal Medicine

## 2018-11-16 VITALS — BP 170/92 | HR 80 | Wt 265.0 lb

## 2018-11-16 DIAGNOSIS — I480 Paroxysmal atrial fibrillation: Secondary | ICD-10-CM | POA: Insufficient documentation

## 2018-11-16 DIAGNOSIS — F17210 Nicotine dependence, cigarettes, uncomplicated: Secondary | ICD-10-CM | POA: Insufficient documentation

## 2018-11-16 DIAGNOSIS — Z79899 Other long term (current) drug therapy: Secondary | ICD-10-CM | POA: Insufficient documentation

## 2018-11-16 DIAGNOSIS — Z5181 Encounter for therapeutic drug level monitoring: Secondary | ICD-10-CM | POA: Insufficient documentation

## 2018-11-16 DIAGNOSIS — I110 Hypertensive heart disease with heart failure: Secondary | ICD-10-CM | POA: Insufficient documentation

## 2018-11-16 DIAGNOSIS — I428 Other cardiomyopathies: Secondary | ICD-10-CM | POA: Insufficient documentation

## 2018-11-16 DIAGNOSIS — I4892 Unspecified atrial flutter: Secondary | ICD-10-CM | POA: Insufficient documentation

## 2018-11-16 DIAGNOSIS — I509 Heart failure, unspecified: Secondary | ICD-10-CM

## 2018-11-16 DIAGNOSIS — F1010 Alcohol abuse, uncomplicated: Secondary | ICD-10-CM | POA: Insufficient documentation

## 2018-11-16 DIAGNOSIS — I5022 Chronic systolic (congestive) heart failure: Secondary | ICD-10-CM | POA: Insufficient documentation

## 2018-11-16 LAB — BASIC METABOLIC PANEL
Anion gap: 9 (ref 5–15)
BUN: 15 mg/dL (ref 6–20)
CHLORIDE: 102 mmol/L (ref 98–111)
CO2: 25 mmol/L (ref 22–32)
CREATININE: 0.95 mg/dL (ref 0.61–1.24)
Calcium: 9.1 mg/dL (ref 8.9–10.3)
GFR calc Af Amer: >60 mL/min (ref >60)
GFR calc non Af Amer: >60 mL/min (ref >60)
Glucose, Bld: 109 mg/dL — ABNORMAL HIGH (ref 70–99)
POTASSIUM: 3.9 mmol/L (ref 3.5–5.1)
Sodium: 136 mmol/L (ref 135–145)

## 2018-11-16 MED ORDER — SACUBITRIL-VALSARTAN 49-51 MG PO TABS: 1.0000 | ORAL_TABLET | Freq: Two times a day (BID) | ORAL | 5 refills | Status: DC

## 2018-11-16 MED ORDER — NICOTINE 14 MG/24HR TD PT24: 14.0000 mg | MEDICATED_PATCH | Freq: Every day | TRANSDERMAL | 0 refills | Status: DC

## 2018-11-16 NOTE — Patient Instructions (Addendum)
It was great to meet you today!  Please INCREASE your Entresto to 49-51 mg TWICE DAILY. You may take #2 tablets TWICE DAILY of your current Entresto 24-26 mg tablets until you receive the higher strength tablets.   Please call 1-800-QUIT-NOW to get nicotine patches to assist you with quitting smoking.   Blood work today. We will call you with any changes.   Please keep your appointment with the NP/PA on 11/24/18 and Dr. Gala Romney on 01/11/19.

## 2018-11-17 ENCOUNTER — Ambulatory Visit (INDEPENDENT_AMBULATORY_CARE_PROVIDER_SITE_OTHER): Payer: Self-pay | Admitting: Family Medicine

## 2018-11-17 ENCOUNTER — Encounter: Payer: Self-pay | Admitting: Family Medicine

## 2018-11-17 VITALS — BP 149/107 | HR 81 | Resp 17 | Ht 77.0 in | Wt 260.8 lb

## 2018-11-17 DIAGNOSIS — Z23 Encounter for immunization: Secondary | ICD-10-CM

## 2018-11-17 DIAGNOSIS — I509 Heart failure, unspecified: Secondary | ICD-10-CM

## 2018-11-17 DIAGNOSIS — I10 Essential (primary) hypertension: Secondary | ICD-10-CM

## 2018-11-17 NOTE — Progress Notes (Signed)
Established Patient Office Visit  Subjective:  Patient ID: Antonio Tapia, male    DOB: Apr 25, 1981  Age: 37 y.o. MRN: [id number]  CC:  Chief Complaint  Patient presents with  . Medical Management of Chronic Issues    HPI Antonio Tapia presents for follow-up of hypertension, atrial flutter, and heart failure. He is followed by [facility]. Currently prescribed Entresto and dose recently titrated in efforts to improve blood pressure. He has cut back to almost not smoking at all. Recently enrolled in the [employer] quit program and will be able to secure free nicotine patches. He has also cut back on alcohol consumption. Blood pressure has continually  remained elevated.Entresto dose only recently changed less than 2 days ago. Denies chest pain, shortness of breath, dizziness, or edema. Past Medical History:  Diagnosis Date  . Acute congestive heart failure (HCC)   . Alcohol use   . Atrial flutter (HCC)   . Essential hypertension   . Tobacco use     No past surgical history on file.  Family History  Problem Relation Age of Onset  . Hypertension Mother   . Hypertension Father   . Diabetes Paternal Grandmother   . Breast cancer Paternal Grandfather     Social History   Socioeconomic History  . Marital status: Single    Spouse name: Not on file  . Number of children: Not on file  . Years of education: Not on file  . Highest education level: Not on file  Occupational History  . Not on file  Social Needs  . Financial resource strain: Not on file  . Food insecurity:    Worry: Not on file    Inability: Not on file  . Transportation needs:    Medical: Not on file    Non-medical: Not on file  Tobacco Use  . Smoking status: Current Every Day Smoker  . Smokeless tobacco: Never Used  Substance and Sexual Activity  . Alcohol use: Yes  . Drug use: No  . Sexual activity: Not on file  Lifestyle  . Physical activity:    Days per week: Not on file    Minutes per session:  Not on file  . Stress: Not on file  Relationships  . Social connections:    Talks on phone: Not on file    Gets together: Not on file    Attends religious service: Not on file    Active member of club or organization: Not on file    Attends meetings of clubs or organizations: Not on file    Relationship status: Not on file  . Intimate partner violence:    Fear of current or ex partner: Not on file    Emotionally abused: Not on file    Physically abused: Not on file    Forced sexual activity: Not on file  Other Topics Concern  . Not on file  Social History Narrative  . Not on file    Outpatient Medications Prior to Visit  Medication Sig Dispense Refill  . carvedilol (COREG) 12.5 MG tablet Take 1 tablet (12.5 mg total) by mouth 2 (two) times daily. 60 tablet 11  . rivaroxaban (XARELTO) 20 MG TABS tablet Take 1 tablet (20 mg total) by mouth daily with supper. 30 tablet 0  . sacubitril-valsartan (ENTRESTO) 49-51 MG Take 1 tablet by mouth 2 (two) times daily. 60 tablet 5  . nicotine (NICODERM CQ - DOSED IN MG/24 HOURS) 14 mg/24hr patch Place 1 patch (14 mg  total) onto the skin daily. (Patient not taking: Reported on 11/17/2018) 28 patch 0   No facility-administered medications prior to visit.     No Known Allergies  ROS Review of Systems Pertinent negatives listed in HPI   Objective:    Physical Exam  BP (!) 149/107   Pulse 81   Resp 17   Ht 6\' 5"  (1.956 m)   Wt 260 lb 12.8 oz (118.3 kg)   SpO2 97%   BMI 30.93 kg/m  Wt Readings from Last 3 Encounters:  11/17/18 260 lb 12.8 oz (118.3 kg)  11/16/18 265 lb (120.2 kg)  10/27/18 261 lb 3.2 oz (118.5 kg)   General appearance: alert, well developed, well nourished, cooperative and in no distress Head: Normocephalic, without obvious abnormality, atraumatic Heart: Regular, rate, and rhythm. No murmurs, gallops, or extra hearts sounds noted. Respiratory: Respirations even and unlabored, normal respiratory rate Extremities:  No gross deformities Skin: Skin color, texture, turgor normal. No rashes seen  Psych: Appropriate mood and affect. Neurologic: Mental status: Alert, oriented to person, place, and time, thought content appropriate.  Health Maintenance Due  Topic Date Due  . HIV Screening  03/25/1996  . TETANUS/TDAP  03/25/2000    There are no preventive care reminders to display for this patient.  Lab Results  Component Value Date   TSH 1.225 10/27/2018   Lab Results  Component Value Date   WBC 6.2 10/04/2018   HGB 13.5 10/04/2018   HCT 41.7 10/04/2018   MCV 99.3 10/04/2018   PLT 259 10/04/2018   Lab Results  Component Value Date   NA 136 11/16/2018   K 3.9 11/16/2018   CO2 25 11/16/2018   GLUCOSE 109 (H) 11/16/2018   BUN 15 11/16/2018   CREATININE 0.95 11/16/2018   BILITOT 0.5 10/12/2018   ALKPHOS 72 10/12/2018   AST 34 10/12/2018   ALT 28 10/12/2018   PROT 7.6 10/12/2018   ALBUMIN 4.3 10/12/2018   CALCIUM 9.1 11/16/2018   ANIONGAP 9 11/16/2018   No results found for: CHOL No results found for: HDL No results found for: LDLCALC No results found for: TRIG No results found for: CHOLHDL Lab Results  Component Value Date   HGBA1C 5.8 (H) 10/12/2018      Assessment & Plan:   Problem List Items Addressed This Visit      Cardiovascular and Mediastinum   Essential hypertension   CHF (congestive heart failure) (HCC) - Primary    Other Visit Diagnoses    Need for Tdap vaccination       Relevant Orders   Tdap vaccine greater than or equal to 7yo IM (Completed)     Follow-up: Return in about 3 months (around 02/16/2019) for Hemoglobin A1C .    [name], FNP

## 2018-11-17 NOTE — Patient Instructions (Signed)
 Coping with Quitting Smoking Quitting smoking is a physical and mental challenge. You will face cravings, withdrawal symptoms, and temptation. Before quitting, work with your health care provider to make a plan that can help you cope. Preparation can help you quit and keep you from giving in. How can I cope with cravings? Cravings usually last for 5-10 minutes. If you get through it, the craving will pass. Consider taking the following actions to help you cope with cravings:  Keep your mouth busy: ? Chew sugar-free gum. ? Suck on hard candies or a straw. ? Brush your teeth.  Keep your hands and body busy: ? Immediately change to a different activity when you feel a craving. ? Squeeze or play with a ball. ? Do an activity or a hobby, like making bead jewelry, practicing needlepoint, or working with wood. ? Mix up your normal routine. ? Take a short exercise break. Go for a quick walk or run up and down stairs. ? Spend time in public places where smoking is not allowed.  Focus on doing something kind or helpful for someone else.  Call a friend or family member to talk during a craving.  Join a support group.  Call a quit line, such as 1-800-QUIT-NOW.  Talk with your health care provider about medicines that might help you cope with cravings and make quitting easier for you.  How can I deal with withdrawal symptoms? Your body may experience negative effects as it tries to get used to not having nicotine in the system. These effects are called withdrawal symptoms. They may include:  Feeling hungrier than normal.  Trouble concentrating.  Irritability.  Trouble sleeping.  Feeling depressed.  Restlessness and agitation.  Craving a cigarette.  To manage withdrawal symptoms:  Avoid places, people, and activities that trigger your cravings.  Remember why you want to quit.  Get plenty of sleep.  Avoid coffee and other caffeinated drinks. These may worsen some of your  symptoms.  How can I handle social situations? Social situations can be difficult when you are quitting smoking, especially in the first few weeks. To manage this, you can:  Avoid parties, bars, and other social situations where people might be smoking.  Avoid alcohol.  Leave right away if you have the urge to smoke.  Explain to your family and friends that you are quitting smoking. Ask for understanding and support.  Plan activities with friends or family where smoking is not an option.  What are some ways I can cope with stress? Wanting to smoke may cause stress, and stress can make you want to smoke. Find ways to manage your stress. Relaxation techniques can help. For example:  Breathe slowly and deeply, in through your nose and out through your mouth.  Listen to soothing, relaxing music.  Talk with a family member or friend about your stress.  Light a candle.  Soak in a bath or take a shower.  Think about a peaceful place.  What are some ways I can prevent weight gain? Be aware that many people gain weight after they quit smoking. However, not everyone does. To keep from gaining weight, have a plan in place before you quit and stick to the plan after you quit. Your plan should include:  Having healthy snacks. When you have a craving, it may help to: ? Eat plain popcorn, crunchy carrots, celery, or other cut vegetables. ? Chew sugar-free gum.  Changing how you eat: ? Eat small portion sizes at meals. ?   Eat 4-6 small meals throughout the day instead of 1-2 large meals a day. ? Be mindful when you eat. Do not watch television or do other things that might distract you as you eat.  Exercising regularly: ? Make time to exercise each day. If you do not have time for a long workout, do short bouts of exercise for 5-10 minutes several times a day. ? Do some form of strengthening exercise, like weight lifting, and some form of aerobic exercise, like running or  swimming.  Drinking plenty of water or other low-calorie or no-calorie drinks. Drink 6-8 glasses of water daily, or as much as instructed by your health care provider.  Summary  Quitting smoking is a physical and mental challenge. You will face cravings, withdrawal symptoms, and temptation to smoke again. Preparation can help you as you go through these challenges.  You can cope with cravings by keeping your mouth busy (such as by chewing gum), keeping your body and hands busy, and making calls to family, friends, or a helpline for people who want to quit smoking.  You can cope with withdrawal symptoms by avoiding places where people smoke, avoiding drinks with caffeine, and getting plenty of rest.  Ask your health care provider about the different ways to prevent weight gain, avoid stress, and handle social situations. This information is not intended to replace advice given to you by your health care provider. Make sure you discuss any questions you have with your health care provider. Document Released: 11/27/2016 Document Revised: 11/27/2016 Document Reviewed: 11/27/2016 Elsevier Interactive Patient Education  2018 Elsevier Inc.  DASH Eating Plan DASH stands for "Dietary Approaches to Stop Hypertension." The DASH eating plan is a healthy eating plan that has been shown to reduce high blood pressure (hypertension). It may also reduce your risk for type 2 diabetes, heart disease, and stroke. The DASH eating plan may also help with weight loss. What are tips for following this plan? General guidelines  Avoid eating more than 2,300 mg (milligrams) of salt (sodium) a day. If you have hypertension, you may need to reduce your sodium intake to 1,500 mg a day.  Limit alcohol intake to no more than 1 drink a day for nonpregnant women and 2 drinks a day for men. One drink equals 12 oz of beer, 5 oz of wine, or 1 oz of hard liquor.  Work with your health care provider to maintain a healthy body  weight or to lose weight. Ask what an ideal weight is for you.  Get at least 30 minutes of exercise that causes your heart to beat faster (aerobic exercise) most days of the week. Activities may include walking, swimming, or biking.  Work with your health care provider or diet and nutrition specialist (dietitian) to adjust your eating plan to your individual calorie needs. Reading food labels  Check food labels for the amount of sodium per serving. Choose foods with less than 5 percent of the Daily Value of sodium. Generally, foods with less than 300 mg of sodium per serving fit into this eating plan.  To find whole grains, look for the word "whole" as the first word in the ingredient list. Shopping  Buy products labeled as "low-sodium" or "no salt added."  Buy fresh foods. Avoid canned foods and premade or frozen meals. Cooking  Avoid adding salt when cooking. Use salt-free seasonings or herbs instead of table salt or sea salt. Check with your health care provider or pharmacist before using salt substitutes.    Do not fry foods. Cook foods using healthy methods such as baking, boiling, grilling, and broiling instead.  Cook with heart-healthy oils, such as olive, canola, soybean, or sunflower oil. Meal planning   Eat a balanced diet that includes: ? 5 or more servings of fruits and vegetables each day. At each meal, try to fill half of your plate with fruits and vegetables. ? Up to 6-8 servings of whole grains each day. ? Less than 6 oz of lean meat, poultry, or fish each day. A 3-oz serving of meat is about the same size as a deck of cards. One egg equals 1 oz. ? 2 servings of low-fat dairy each day. ? A serving of nuts, seeds, or beans 5 times each week. ? Heart-healthy fats. Healthy fats called Omega-3 fatty acids are found in foods such as flaxseeds and coldwater fish, like sardines, salmon, and mackerel.  Limit how much you eat of the following: ? Canned or prepackaged  foods. ? Food that is high in trans fat, such as fried foods. ? Food that is high in saturated fat, such as fatty meat. ? Sweets, desserts, sugary drinks, and other foods with added sugar. ? Full-fat dairy products.  Do not salt foods before eating.  Try to eat at least 2 vegetarian meals each week.  Eat more home-cooked food and less restaurant, buffet, and fast food.  When eating at a restaurant, ask that your food be prepared with less salt or no salt, if possible. What foods are recommended? The items listed may not be a complete list. Talk with your dietitian about what dietary choices are best for you. Grains Whole-grain or whole-wheat bread. Whole-grain or whole-wheat pasta. Brown rice. Oatmeal. Quinoa. Bulgur. Whole-grain and low-sodium cereals. Pita bread. Low-fat, low-sodium crackers. Whole-wheat flour tortillas. Vegetables Fresh or frozen vegetables (raw, steamed, roasted, or grilled). Low-sodium or reduced-sodium tomato and vegetable juice. Low-sodium or reduced-sodium tomato sauce and tomato paste. Low-sodium or reduced-sodium canned vegetables. Fruits All fresh, dried, or frozen fruit. Canned fruit in natural juice (without added sugar). Meat and other protein foods Skinless chicken or turkey. Ground chicken or turkey. Pork with fat trimmed off. Fish and seafood. Egg whites. Dried beans, peas, or lentils. Unsalted nuts, nut butters, and seeds. Unsalted canned beans. Lean cuts of beef with fat trimmed off. Low-sodium, lean deli meat. Dairy Low-fat (1%) or fat-free (skim) milk. Fat-free, low-fat, or reduced-fat cheeses. Nonfat, low-sodium ricotta or cottage cheese. Low-fat or nonfat yogurt. Low-fat, low-sodium cheese. Fats and oils Soft margarine without trans fats. Vegetable oil. Low-fat, reduced-fat, or light mayonnaise and salad dressings (reduced-sodium). Canola, safflower, olive, soybean, and sunflower oils. Avocado. Seasoning and other foods Herbs. Spices. Seasoning  mixes without salt. Unsalted popcorn and pretzels. Fat-free sweets. What foods are not recommended? The items listed may not be a complete list. Talk with your dietitian about what dietary choices are best for you. Grains Baked goods made with fat, such as croissants, muffins, or some breads. Dry pasta or rice meal packs. Vegetables Creamed or fried vegetables. Vegetables in a cheese sauce. Regular canned vegetables (not low-sodium or reduced-sodium). Regular canned tomato sauce and paste (not low-sodium or reduced-sodium). Regular tomato and vegetable juice (not low-sodium or reduced-sodium). Pickles. Olives. Fruits Canned fruit in a light or heavy syrup. Fried fruit. Fruit in cream or butter sauce. Meat and other protein foods Fatty cuts of meat. Ribs. Fried meat. Bacon. Sausage. Bologna and other processed lunch meats. Salami. Fatback. Hotdogs. Bratwurst. Salted nuts and seeds. Canned beans   with added salt. Canned or smoked fish. Whole eggs or egg yolks. Chicken or turkey with skin. Dairy Whole or 2% milk, cream, and half-and-half. Whole or full-fat cream cheese. Whole-fat or sweetened yogurt. Full-fat cheese. Nondairy creamers. Whipped toppings. Processed cheese and cheese spreads. Fats and oils Butter. Stick margarine. Lard. Shortening. Ghee. Bacon fat. Tropical oils, such as coconut, palm kernel, or palm oil. Seasoning and other foods Salted popcorn and pretzels. Onion salt, garlic salt, seasoned salt, table salt, and sea salt. Worcestershire sauce. Tartar sauce. Barbecue sauce. Teriyaki sauce. Soy sauce, including reduced-sodium. Steak sauce. Canned and packaged gravies. Fish sauce. Oyster sauce. Cocktail sauce. Horseradish that you find on the shelf. Ketchup. Mustard. Meat flavorings and tenderizers. Bouillon cubes. Hot sauce and Tabasco sauce. Premade or packaged marinades. Premade or packaged taco seasonings. Relishes. Regular salad dressings. Where to find more information:  National  Heart, Lung, and Blood Institute: www.nhlbi.nih.gov  American Heart Association: www.heart.org Summary  The DASH eating plan is a healthy eating plan that has been shown to reduce high blood pressure (hypertension). It may also reduce your risk for type 2 diabetes, heart disease, and stroke.  With the DASH eating plan, you should limit salt (sodium) intake to 2,300 mg a day. If you have hypertension, you may need to reduce your sodium intake to 1,500 mg a day.  When on the DASH eating plan, aim to eat more fresh fruits and vegetables, whole grains, lean proteins, low-fat dairy, and heart-healthy fats.  Work with your health care provider or diet and nutrition specialist (dietitian) to adjust your eating plan to your individual calorie needs. This information is not intended to replace advice given to you by your health care provider. Make sure you discuss any questions you have with your health care provider. Document Released: 11/19/2011 Document Revised: 11/23/2016 Document Reviewed: 11/23/2016 Elsevier Interactive Patient Education  2018 Elsevier Inc.  

## 2018-11-21 ENCOUNTER — Other Ambulatory Visit (HOSPITAL_COMMUNITY): Payer: Self-pay | Admitting: Pharmacist

## 2018-11-21 ENCOUNTER — Encounter (HOSPITAL_COMMUNITY): Payer: Self-pay | Admitting: Pharmacist

## 2018-11-21 MED ORDER — SACUBITRIL-VALSARTAN 49-51 MG PO TABS: 1.0000 | ORAL_TABLET | Freq: Two times a day (BID) | ORAL | 11 refills | Status: DC

## 2018-11-21 MED ORDER — RIVAROXABAN 20 MG PO TABS: 20.0000 mg | ORAL_TABLET | Freq: Every day | ORAL | 3 refills | Status: DC

## 2018-11-24 ENCOUNTER — Encounter (HOSPITAL_COMMUNITY): Payer: Self-pay

## 2018-11-29 ENCOUNTER — Telehealth (HOSPITAL_COMMUNITY): Payer: Self-pay | Admitting: Pharmacist

## 2018-11-29 NOTE — Telephone Encounter (Signed)
[employer] patient assistance aprpoved for Entresto 49-51 mg BID through 11/29/19.   Tyler Deis. Bonnye Fava, PharmD, BCPS, CPP Clinical Pharmacist Phone: [phone] 11/29/2018 2:24 PM

## 2018-11-29 NOTE — Telephone Encounter (Signed)
[employer] patient assistance approved for Xarelto 20 mg daily for up to 1 year.   Tyler Deis. Bonnye Fava, PharmD, BCPS, CPP Clinical Pharmacist Phone: [phone] 11/29/2018 2:27 PM

## 2018-11-30 MED FILL — CARVEDILOL 12.5 MG TABLET: 12.5 | 30 days supply | Qty: 60 | Fill #1

## 2018-12-15 ENCOUNTER — Telehealth (HOSPITAL_COMMUNITY): Payer: Self-pay | Admitting: Licensed Clinical Social Worker

## 2018-12-15 ENCOUNTER — Ambulatory Visit (HOSPITAL_COMMUNITY)
Admission: RE | Admit: 2018-12-15 | Discharge: 2018-12-15 | Disposition: A | Discharge status: Home / Self Care | Payer: Self-pay | Source: Ambulatory Visit | Attending: Cardiology | Admitting: Cardiology

## 2018-12-15 ENCOUNTER — Encounter (HOSPITAL_COMMUNITY): Payer: Self-pay

## 2018-12-15 VITALS — BP 152/96 | HR 79 | Wt 261.6 lb

## 2018-12-15 DIAGNOSIS — Z7901 Long term (current) use of anticoagulants: Secondary | ICD-10-CM | POA: Insufficient documentation

## 2018-12-15 DIAGNOSIS — Z79899 Other long term (current) drug therapy: Secondary | ICD-10-CM | POA: Insufficient documentation

## 2018-12-15 DIAGNOSIS — F17210 Nicotine dependence, cigarettes, uncomplicated: Secondary | ICD-10-CM | POA: Insufficient documentation

## 2018-12-15 DIAGNOSIS — I4892 Unspecified atrial flutter: Secondary | ICD-10-CM | POA: Insufficient documentation

## 2018-12-15 DIAGNOSIS — I10 Essential (primary) hypertension: Secondary | ICD-10-CM

## 2018-12-15 DIAGNOSIS — I483 Typical atrial flutter: Secondary | ICD-10-CM

## 2018-12-15 DIAGNOSIS — I110 Hypertensive heart disease with heart failure: Secondary | ICD-10-CM | POA: Insufficient documentation

## 2018-12-15 DIAGNOSIS — F1010 Alcohol abuse, uncomplicated: Secondary | ICD-10-CM | POA: Insufficient documentation

## 2018-12-15 DIAGNOSIS — Z8249 Family history of ischemic heart disease and other diseases of the circulatory system: Secondary | ICD-10-CM | POA: Insufficient documentation

## 2018-12-15 DIAGNOSIS — I5022 Chronic systolic (congestive) heart failure: Secondary | ICD-10-CM | POA: Insufficient documentation

## 2018-12-15 DIAGNOSIS — I509 Heart failure, unspecified: Secondary | ICD-10-CM

## 2018-12-15 DIAGNOSIS — F17200 Nicotine dependence, unspecified, uncomplicated: Secondary | ICD-10-CM

## 2018-12-15 DIAGNOSIS — I480 Paroxysmal atrial fibrillation: Secondary | ICD-10-CM | POA: Insufficient documentation

## 2018-12-15 LAB — BASIC METABOLIC PANEL
ANION GAP: 10 (ref 5–15)
BUN: 16 mg/dL (ref 6–20)
CO2: 26 mmol/L (ref 22–32)
Calcium: 8.8 mg/dL — ABNORMAL LOW (ref 8.9–10.3)
Chloride: 101 mmol/L (ref 98–111)
Creatinine, Ser: 1.18 mg/dL (ref 0.61–1.24)
GFR calc Af Amer: >60 mL/min (ref >60)
GFR calc non Af Amer: >60 mL/min (ref >60)
Glucose, Bld: 100 mg/dL — ABNORMAL HIGH (ref 70–99)
Potassium: 4.6 mmol/L (ref 3.5–5.1)
Sodium: 137 mmol/L (ref 135–145)

## 2018-12-15 MED ORDER — ISOSORB DINITRATE-HYDRALAZINE 20-37.5 MG PO TABS: 1.0000 | ORAL_TABLET | Freq: Three times a day (TID) | ORAL | 11 refills | Status: DC

## 2018-12-15 MED ORDER — ISOSORB DINITRATE-HYDRALAZINE 20-37.5 MG PO TABS: 1.0000 | ORAL_TABLET | Freq: Three times a day (TID) | ORAL | 3 refills | Status: DC

## 2018-12-15 NOTE — Patient Instructions (Signed)
START Bidil one tab three times daily  Labs today We will only contact you if something comes back abnormal or we need to make some changes. Otherwise no news is good news!  Keep follow up as scheduled   Do the following things EVERYDAY: 1) Weigh yourself in the morning before breakfast. Write it down and keep it in a log. 2) Take your medicines as prescribed 3) Eat low salt foods-Limit salt (sodium) to 2000 mg per day.  4) Stay as active as you can everyday 5) Limit all fluids for the day to less than 2 liters

## 2018-12-15 NOTE — Addendum Note (Signed)
Encounter addended by: Theresia Bough, CMA on: 12/15/2018 3:04 PM  Actions taken: Order list changed

## 2018-12-15 NOTE — Progress Notes (Signed)
[facility] Note   Referring Physician: Rudi Coco NP  Primary Care: [name] NP  Primary Cardiologist:  HPI:  Antonio Tapia is a 38 y.o. male  with a history of ETOH abuse, A flutter, tobacco abuse, ETOH abuse 4 drinks a day, and newly reduced EF. Denies cocaine   He was evaluated in [facility] ED on 09/19/18 with palpitations. He had been having palpitations 4-5 days prior to visit.. EKG showed A flutter RVR with rate in the 130s. He spontaneously converted and discharged on lopressor and xarelto. He was referred to A fib clinic and was seen on 09/22/18. At that time he was continued on lopressor. Sleep study was recommended but not completed because he does not have insurance.   He returned to the [facility] ED on 10/22 with chest pain. Troponin was negative. Cardiology was consulted. He has mild volume overload and started on po lasix. On 10/23 he had an ECHO that showed reduced EF 30-35%. He was referred to HF clinic.   He presents today for follow up. Last visit started on Entresto and changed to coreg. He is feeling great overall. Continues to work full time at [employer] and [employer]. Lifts 30-40 lbs boxes regularly with no difficulty. He denies SOB/PND/Orthopnea. No bleeding on Xarelto. Denies cough, fever, or chills. He denies any DOE, lightheadedness or dizziness. Taking all medications as directed. He has patient assistance for Xarelto and Entresto. Continues to drink 3-5 liquor shots daily, he was doing this in addition to beer, and has now stopped beer. He denies any further chest pain.   SH: Smokes 1/2 PPD but has cut back, drinks at least 4 drinks a day, works as Leisure centre manager. Takes the bus to appointments. He has difficulty paying for medications.   FH: no family of CAD or heart failure.   ECHO 10/05/2018  Right ventricular systolic presure 53 mmHg, suggesting at least   moderate pulmonary hypertension. Reduced LV ejection fraction   30-35% with global  hypokinesis.  Review of systems complete and found to be negative unless listed in HPI.    Past Medical History:  Diagnosis Date  . Acute congestive heart failure (HCC)   . Alcohol use   . Atrial flutter (HCC)   . Essential hypertension   . Tobacco use     Current Outpatient Medications  Medication Sig Dispense Refill  . carvedilol (COREG) 12.5 MG tablet Take 1 tablet (12.5 mg total) by mouth 2 (two) times daily. 60 tablet 11  . Multiple Vitamin (MULTIVITAMIN) capsule Take 1 capsule by mouth daily.    . rivaroxaban (XARELTO) 20 MG TABS tablet Take 1 tablet (20 mg total) by mouth daily with supper. 90 tablet 3  . sacubitril-valsartan (ENTRESTO) 49-51 MG Take 1 tablet by mouth 2 (two) times daily. 60 tablet 11  . nicotine (NICODERM CQ - DOSED IN MG/24 HOURS) 14 mg/24hr patch Place 1 patch (14 mg total) onto the skin daily. (Patient not taking: Reported on 11/17/2018) 28 patch 0   No current facility-administered medications for this encounter.    No Known Allergies  Social History   Socioeconomic History  . Marital status: Single    Spouse name: Not on file  . Number of children: Not on file  . Years of education: Not on file  . Highest education level: Not on file  Occupational History  . Not on file  Social Needs  . Financial resource strain: Not on file  . Food insecurity:    Worry:  Not on file    Inability: Not on file  . Transportation needs:    Medical: Not on file    Non-medical: Not on file  Tobacco Use  . Smoking status: Current Every Day Smoker  . Smokeless tobacco: Never Used  Substance and Sexual Activity  . Alcohol use: Yes  . Drug use: No  . Sexual activity: Not on file  Lifestyle  . Physical activity:    Days per week: Not on file    Minutes per session: Not on file  . Stress: Not on file  Relationships  . Social connections:    Talks on phone: Not on file    Gets together: Not on file    Attends religious service: Not on file    Active member  of club or organization: Not on file    Attends meetings of clubs or organizations: Not on file    Relationship status: Not on file  . Intimate partner violence:    Fear of current or ex partner: Not on file    Emotionally abused: Not on file    Physically abused: Not on file    Forced sexual activity: Not on file  Other Topics Concern  . Not on file  Social History Narrative  . Not on file    Family History  Problem Relation Age of Onset  . Hypertension Mother   . Hypertension Father   . Diabetes Paternal Grandmother   . Breast cancer Paternal Grandfather    Vitals:   12/15/18 1412  BP: (!) 152/96  Pulse: 79  SpO2: 99%  Weight: 118.7 kg (261 lb 9.6 oz)    Wt Readings from Last 3 Encounters:  12/15/18 118.7 kg (261 lb 9.6 oz)  11/17/18 118.3 kg (260 lb 12.8 oz)  11/16/18 120.2 kg (265 lb)     PHYSICAL EXAM: General: Well appearing. No resp difficulty. HEENT: Normal Neck: Supple. JVP 5-6. Carotids 2+ bilat; no bruits. No thyromegaly or nodule noted. Cor: PMI nondisplaced. RRR, No M/G/R noted Lungs: CTAB, normal effort. Abdomen: Soft, non-tender, non-distended, no HSM. No bruits or masses. +BS  Extremities: No cyanosis, clubbing, or rash. R and LLE no edema.  Neuro: Alert & orientedx3, cranial nerves grossly intact. moves all 4 extremities w/o difficulty. Affect pleasant   ASSESSMENT & PLAN:  1. Chronic Systolic HF Newly diagnosed October 2019. He has not had an ischemic work up. Troponin negative in October. No chest pain. Does not recall virus. Suspect NICM but if he has recurrent chest tightness he will need cath. 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  - Plan to repeat ECHO after HF meds optimized. --> End of January.  - NYHA II symptoms - Volume status stable on exam.   - Continue lasix 20 mg as needed.  - Continue entresto 49/51 mg BID - Continue carvedilol 12.5 mg BID.  - Start bidil 1 tab TID - Next visit consider  spironolactone. (Had planned this visit, but with HTN chose Bidil instead) - Reinforced fluid restriction to < 2 L daily, sodium restriction to less than 2000 mg daily, and the importance of daily weights.    2. HTN - Meds as above.   3. PAF - Regular on exam - Continue coreg 12.5 mg BID.  - Continue xarelto 20 mg daily  - TSH stable 10/27/18  4. ETOH abuse - Encouraged complete cessation.  Possible ETOH induced. Encouraged him to stop drinking alcohol.   5. Tobacco Abuse - Encouraged smoking  cessation.   Doing well overall. Relatively asymptomatic. Meds and labs as above. RTC 4 weeks with Echo as planned. If not improved, will need to consider cath vs stress test to r/o ischemic CMP.   Graciella Freer, PA-C  2:14 PM  Greater than 50% of the 25 minute visit was spent in counseling/coordination of care regarding disease state education, salt/fluid restriction, sliding scale diuretics, and medication compliance.

## 2018-12-15 NOTE — Telephone Encounter (Signed)
CSW received call from pt mom regarding bidil card received today in clinic- informed by pharmacy that it didn't work and they needed to call us to get a new card.  CSW discussed with clinic staff who will call bidil rep to get updated cards.  Pt mom then inquired about pt assistance fund application for Xarelto- CSW reviewed records and found notice that pt had been approved for Xarelto through 11/30/19- CSW informed mom and provided with help line number to call and find out how to start getting medications.  CSW will continue to follow and assist as needed   Burna Sis, LCSW Clinical Social Worker [facility] [phone]

## 2018-12-16 ENCOUNTER — Telehealth (HOSPITAL_COMMUNITY): Payer: Self-pay

## 2018-12-16 NOTE — Telephone Encounter (Signed)
Medication Samples have been provided to the patient.  Drug name: Xarelto       Strength: 20 mg        Qty: 2  LOT: [id number]  Exp.Date: 08/21  Dosing instructions: Take 1 tablet (20 mg total) by mouth daily with supper.  The patient has been instructed regarding the correct time, dose, and frequency of taking this medication, including desired effects and most common side effects.   Mancel Bale 2:39 PM 12/16/2018   Medication Samples have been provided to the patient.  Drug name: Sherryll Burger       Strength: 49/51 mg        Qty: 1  LOT: [id number]  Exp.Date: 02/2021  Dosing instructions: Take 1 tablet by mouth 2 (two) times daily.  The patient has been instructed regarding the correct time, dose, and frequency of taking this medication, including desired effects and most common side effects.   Mancel Bale 2:40 PM 12/16/2018

## 2018-12-20 ENCOUNTER — Telehealth (HOSPITAL_COMMUNITY): Payer: Self-pay | Admitting: Licensed Clinical Social Worker

## 2018-12-20 NOTE — Telephone Encounter (Signed)
CSW called pt mom to follow up regarding pt getting Bidil.  No new assistance cards obtained at this time but CSW able to get patient Bidil sample so he can start the medication.  Sample and Bidil assistance application place at front desk for pt mom to pick up- will plan to follow up regarding bidil assistance application when pt comes in for appt on 1/29  CSW will continue to follow in clinic and assist as needed  Burna Sis, LCSW Clinical Social Worker [facility] [phone]

## 2018-12-22 ENCOUNTER — Telehealth (HOSPITAL_COMMUNITY): Payer: Self-pay | Admitting: Licensed Clinical Social Worker

## 2018-12-22 NOTE — Telephone Encounter (Signed)
Completed application for bidil assistance through [employer] faxed for review- fax confirmation receive  Burna Sis, LCSW Clinical Social Worker [facility] [phone]

## 2018-12-27 ENCOUNTER — Telehealth (HOSPITAL_COMMUNITY): Payer: Self-pay | Admitting: Licensed Clinical Social Worker

## 2018-12-27 NOTE — Telephone Encounter (Signed)
CSW received call from [employer] assistance (Bidil) to inform that they are missing last 3 pay stubs that are necessary to finish processing application.  CSW called and informed pt mom of need for pay stubs- they will bring later this week.  Pt reference number with [employer] assistance program is [phone]  CSW will continue to follow and assist as needed  Burna Sis, LCSW Clinical Social Worker [facility] [phone]

## 2018-12-30 ENCOUNTER — Telehealth (HOSPITAL_COMMUNITY): Payer: Self-pay | Admitting: Licensed Clinical Social Worker

## 2018-12-30 NOTE — Telephone Encounter (Signed)
CSW called pt in regards to [facility] and [place] assistance application he had brought to clinic for Xarelto.  Pt already completed Xarelto application in December and was approved until 11/30/19- pt mom reports he has still been unable to get his medications.  CSW called assistance program who states that pt should have been sent a pharmacy card- no card received- CSW provided with pharmacy card information and provided to pharmacy.  Pt mom updated that pt should now be able to get Xarelto from pharmacy through this card for no cost  CSW will continue to follow and assist as needed.  Burna Sis, LCSW Clinical Social Worker [facility] [phone]

## 2019-01-03 ENCOUNTER — Telehealth (HOSPITAL_COMMUNITY): Payer: Self-pay | Admitting: Licensed Clinical Social Worker

## 2019-01-03 NOTE — Telephone Encounter (Signed)
CSW called to check on pt Bidil assistance application through [employer]- CSW informed that pt has been approved and that first shipment is being sent out today  CSW called and left message for pt to inform  CSW will continue to follow and assist as needed  Burna Sis, LCSW Clinical Social Worker [facility] [phone]

## 2019-01-04 ENCOUNTER — Other Ambulatory Visit (HOSPITAL_COMMUNITY): Payer: Self-pay | Admitting: Cardiology

## 2019-01-04 ENCOUNTER — Telehealth (HOSPITAL_COMMUNITY): Payer: Self-pay | Admitting: Licensed Clinical Social Worker

## 2019-01-04 MED ORDER — RIVAROXABAN 20 MG PO TABS: 20.0000 mg | ORAL_TABLET | Freq: Every day | ORAL | 3 refills | Status: DC

## 2019-01-04 NOTE — Telephone Encounter (Signed)
Pt called CSW to discuss issues getting Xarelto.  CSW called [facility] pharmacy who states they don't have an active prescription- new prescription sent to pharmacy- confirmed they still have pharmacy card information for xarelto.  CSW will continue to follow and assist as needed  Burna Sis, LCSW Clinical Social Worker [facility] [phone]

## 2019-01-06 ENCOUNTER — Telehealth (HOSPITAL_COMMUNITY): Payer: Self-pay | Admitting: *Deleted

## 2019-01-06 NOTE — Telephone Encounter (Signed)
Left VM that Bidil is at the front desk for pick up.

## 2019-01-11 ENCOUNTER — Encounter (HOSPITAL_COMMUNITY): Payer: Self-pay

## 2019-01-11 ENCOUNTER — Ambulatory Visit (HOSPITAL_COMMUNITY)
Admission: RE | Admit: 2019-01-11 | Payer: BLUE CROSS/BLUE SHIELD | Source: Ambulatory Visit | Admitting: Internal Medicine

## 2019-01-11 ENCOUNTER — Ambulatory Visit (HOSPITAL_COMMUNITY)
Admission: RE | Admit: 2019-01-11 | Discharge: 2019-01-11 | Disposition: A | Discharge status: Home / Self Care | Payer: BLUE CROSS/BLUE SHIELD | Source: Ambulatory Visit | Attending: Family Medicine | Admitting: Family Medicine

## 2019-01-11 DIAGNOSIS — I509 Heart failure, unspecified: Secondary | ICD-10-CM

## 2019-02-08 ENCOUNTER — Other Ambulatory Visit (HOSPITAL_COMMUNITY): Payer: Self-pay

## 2019-02-08 MED ORDER — ISOSORB DINITRATE-HYDRALAZINE 20-37.5 MG PO TABS: 1.0000 | ORAL_TABLET | Freq: Three times a day (TID) | ORAL | 11 refills | Status: DC

## 2019-02-15 ENCOUNTER — Other Ambulatory Visit: Payer: Self-pay

## 2019-02-16 ENCOUNTER — Ambulatory Visit: Payer: Self-pay | Admitting: Family Medicine

## 2019-02-20 ENCOUNTER — Other Ambulatory Visit (HOSPITAL_COMMUNITY): Payer: Self-pay

## 2019-02-20 MED ORDER — ISOSORB DINITRATE-HYDRALAZINE 20-37.5 MG PO TABS: 1.0000 | ORAL_TABLET | Freq: Three times a day (TID) | ORAL | 6 refills | Status: DC

## 2019-02-28 ENCOUNTER — Telehealth (HOSPITAL_COMMUNITY): Payer: Self-pay

## 2019-02-28 NOTE — Telephone Encounter (Signed)
Prior authorization through [employer] company was initiated for bidil medication and sent via cmm on 02/28/2019.

## 2019-03-02 ENCOUNTER — Telehealth (HOSPITAL_COMMUNITY): Payer: Self-pay

## 2019-03-02 NOTE — Telephone Encounter (Signed)
This patient's insurance has denied his prior authorization because they want him to have tried and failed both hydralazine and isosorbide mononitrate. Currently he has only failed hydralazine. Is it ok that his bidil change to the separate components?

## 2019-03-03 NOTE — Telephone Encounter (Signed)
I would do hydralazine 50 mg TID and imdur 30 mg daily. Thanks  Casimiro Needle "[employer], [place]

## 2019-03-03 NOTE — Telephone Encounter (Signed)
Can you confirm imdur and hydralazine dosage?

## 2019-03-03 NOTE — Telephone Encounter (Signed)
Left message for pt to call back to discuss current medication/ changes

## 2019-03-03 NOTE — Telephone Encounter (Signed)
Yes

## 2019-03-07 ENCOUNTER — Other Ambulatory Visit (HOSPITAL_COMMUNITY): Payer: Self-pay

## 2019-03-07 MED ORDER — ISOSORB DINITRATE-HYDRALAZINE 20-37.5 MG PO TABS: 1.0000 | ORAL_TABLET | Freq: Three times a day (TID) | ORAL | 6 refills | Status: DC

## 2019-03-07 NOTE — Telephone Encounter (Signed)
Left voicemail for pt to call for med changes

## 2019-03-07 NOTE — Telephone Encounter (Signed)
Received fax from [facility] patient service for refill request for BIDIL

## 2019-03-21 NOTE — Telephone Encounter (Signed)
Left voicemail for pt to call us for med changes

## 2019-06-11 ENCOUNTER — Other Ambulatory Visit (HOSPITAL_COMMUNITY): Payer: Self-pay | Admitting: Adult Health

## 2019-06-15 ENCOUNTER — Other Ambulatory Visit (HOSPITAL_COMMUNITY): Payer: Self-pay

## 2019-06-15 MED ORDER — ENTRESTO 24-26 MG PO TABS: 1.0000 | ORAL_TABLET | Freq: Two times a day (BID) | ORAL | 1 refills | Status: DC

## 2019-09-14 ENCOUNTER — Other Ambulatory Visit (HOSPITAL_COMMUNITY): Payer: Self-pay

## 2019-09-14 MED ORDER — ENTRESTO 24-26 MG PO TABS: 1.0000 | ORAL_TABLET | Freq: Two times a day (BID) | ORAL | 1 refills | Status: DC

## 2019-10-17 ENCOUNTER — Telehealth (HOSPITAL_COMMUNITY): Payer: Self-pay | Admitting: Pharmacy Technician

## 2019-10-17 NOTE — Telephone Encounter (Signed)
Received a notification from [employer] that it is time to re-enroll patient in assistance for Entresto.  Ran a test claim and a 30 day co-pay is $20. Signed patient up for $10 co-pay card.  Pharmacy Billing Information  [place]: [id number]  PCN: [id number]  Group: [id number]  ID: [id number]  Called patient to give him co-pay card information and see which pharmacy we would need to send his prescription to. Had to leave a message.  Will follow up.  Charlann Boxer, CPhT

## 2019-10-24 NOTE — Telephone Encounter (Signed)
Attempted to call patient again. Left message for him to give me a return call.  Charlann Boxer, CPhT

## 2019-10-30 NOTE — Telephone Encounter (Signed)
Have called patient several times, left message again this morning. Will have card information ready when he is ready to go fill the medication at his local pharmacy.   Charlann Boxer, CPhT

## 2020-01-21 ENCOUNTER — Other Ambulatory Visit (HOSPITAL_COMMUNITY): Payer: Self-pay | Admitting: Adult Health

## 2020-01-22 ENCOUNTER — Other Ambulatory Visit (HOSPITAL_COMMUNITY): Payer: Self-pay

## 2020-01-22 MED ORDER — RIVAROXABAN 20 MG PO TABS: 20.0000 mg | ORAL_TABLET | Freq: Every day | ORAL | 3 refills | Status: DC

## 2020-09-16 ENCOUNTER — Telehealth (HOSPITAL_COMMUNITY): Payer: Self-pay | Admitting: *Deleted

## 2020-09-16 NOTE — Telephone Encounter (Signed)
Pt's girlfriend called to inquire about getting pt an appt asap, she states he does not have insurance or money and is swollen, SOB and has been out of meds. Advised we do see pt regardless of insurance or money, due to HIPPA need to speak w/pt.  She put pt on the phone, he states he has been out of meds for a while, he frequently gets bloated and swollen in his abd, he gets SOB with very little exertion, he is often fatigued and stays in bed a lot.  Discussed all above w/Amy Filbert Schilder, NP she agrees to see pt tomorrow AM.  Pt aware and agreeable and will be at office at 8:30 AM, he is aware he is a work-in so he may have to wait and is agreeable with this plan.  Eileen Stanford, CSW also aware of pt's appt

## 2020-09-17 ENCOUNTER — Ambulatory Visit (HOSPITAL_COMMUNITY)
Admission: RE | Admit: 2020-09-17 | Discharge: 2020-09-17 | Disposition: A | Discharge status: Home / Self Care | Payer: Self-pay | Source: Ambulatory Visit | Attending: Adult Health | Admitting: Adult Health

## 2020-09-17 ENCOUNTER — Inpatient Hospital Stay (HOSPITAL_COMMUNITY): Payer: Self-pay | Admitting: Critical Care Medicine

## 2020-09-17 ENCOUNTER — Inpatient Hospital Stay (HOSPITAL_COMMUNITY): Payer: Self-pay

## 2020-09-17 ENCOUNTER — Other Ambulatory Visit: Payer: Self-pay

## 2020-09-17 ENCOUNTER — Inpatient Hospital Stay (HOSPITAL_COMMUNITY)
Admission: AD | Admit: 2020-09-17 | Discharge: 2020-09-30 | DRG: 286 | Disposition: A | Discharge status: Home / Self Care | Payer: Self-pay | Source: Ambulatory Visit | Attending: Internal Medicine | Admitting: Internal Medicine

## 2020-09-17 ENCOUNTER — Inpatient Hospital Stay: Payer: Self-pay

## 2020-09-17 ENCOUNTER — Encounter (HOSPITAL_COMMUNITY): Payer: Self-pay | Admitting: Internal Medicine

## 2020-09-17 VITALS — BP 145/100 | HR 117 | Wt 288.0 lb

## 2020-09-17 DIAGNOSIS — Z4659 Encounter for fitting and adjustment of other gastrointestinal appliance and device: Secondary | ICD-10-CM

## 2020-09-17 DIAGNOSIS — I509 Heart failure, unspecified: Secondary | ICD-10-CM

## 2020-09-17 DIAGNOSIS — I5021 Acute systolic (congestive) heart failure: Secondary | ICD-10-CM

## 2020-09-17 DIAGNOSIS — I10 Essential (primary) hypertension: Secondary | ICD-10-CM

## 2020-09-17 DIAGNOSIS — R570 Cardiogenic shock: Secondary | ICD-10-CM | POA: Diagnosis present

## 2020-09-17 DIAGNOSIS — N170 Acute kidney failure with tubular necrosis: Secondary | ICD-10-CM | POA: Diagnosis present

## 2020-09-17 DIAGNOSIS — Z833 Family history of diabetes mellitus: Secondary | ICD-10-CM

## 2020-09-17 DIAGNOSIS — Z79899 Other long term (current) drug therapy: Secondary | ICD-10-CM

## 2020-09-17 DIAGNOSIS — I4892 Unspecified atrial flutter: Secondary | ICD-10-CM | POA: Insufficient documentation

## 2020-09-17 DIAGNOSIS — Z9911 Dependence on respirator [ventilator] status: Secondary | ICD-10-CM

## 2020-09-17 DIAGNOSIS — I4821 Permanent atrial fibrillation: Secondary | ICD-10-CM | POA: Diagnosis present

## 2020-09-17 DIAGNOSIS — J15211 Pneumonia due to Methicillin susceptible Staphylococcus aureus: Secondary | ICD-10-CM | POA: Diagnosis not present

## 2020-09-17 DIAGNOSIS — I130 Hypertensive heart and chronic kidney disease with heart failure and stage 1 through stage 4 chronic kidney disease, or unspecified chronic kidney disease: Principal | ICD-10-CM | POA: Diagnosis present

## 2020-09-17 DIAGNOSIS — Z6839 Body mass index (BMI) 39.0-39.9, adult: Secondary | ICD-10-CM

## 2020-09-17 DIAGNOSIS — R739 Hyperglycemia, unspecified: Secondary | ICD-10-CM | POA: Diagnosis present

## 2020-09-17 DIAGNOSIS — I5023 Acute on chronic systolic (congestive) heart failure: Secondary | ICD-10-CM

## 2020-09-17 DIAGNOSIS — Z20822 Contact with and (suspected) exposure to covid-19: Secondary | ICD-10-CM | POA: Diagnosis present

## 2020-09-17 DIAGNOSIS — Z9114 Patient's other noncompliance with medication regimen: Secondary | ICD-10-CM

## 2020-09-17 DIAGNOSIS — A419 Sepsis, unspecified organism: Secondary | ICD-10-CM | POA: Diagnosis not present

## 2020-09-17 DIAGNOSIS — Z8249 Family history of ischemic heart disease and other diseases of the circulatory system: Secondary | ICD-10-CM | POA: Insufficient documentation

## 2020-09-17 DIAGNOSIS — R6521 Severe sepsis with septic shock: Secondary | ICD-10-CM | POA: Diagnosis not present

## 2020-09-17 DIAGNOSIS — F1010 Alcohol abuse, uncomplicated: Secondary | ICD-10-CM | POA: Insufficient documentation

## 2020-09-17 DIAGNOSIS — N179 Acute kidney failure, unspecified: Secondary | ICD-10-CM

## 2020-09-17 DIAGNOSIS — N189 Chronic kidney disease, unspecified: Secondary | ICD-10-CM | POA: Diagnosis present

## 2020-09-17 DIAGNOSIS — J9601 Acute respiratory failure with hypoxia: Secondary | ICD-10-CM

## 2020-09-17 DIAGNOSIS — F17200 Nicotine dependence, unspecified, uncomplicated: Secondary | ICD-10-CM

## 2020-09-17 DIAGNOSIS — R002 Palpitations: Secondary | ICD-10-CM | POA: Insufficient documentation

## 2020-09-17 DIAGNOSIS — R0600 Dyspnea, unspecified: Secondary | ICD-10-CM

## 2020-09-17 DIAGNOSIS — E871 Hypo-osmolality and hyponatremia: Secondary | ICD-10-CM | POA: Diagnosis present

## 2020-09-17 DIAGNOSIS — E669 Obesity, unspecified: Secondary | ICD-10-CM | POA: Diagnosis present

## 2020-09-17 DIAGNOSIS — Z23 Encounter for immunization: Secondary | ICD-10-CM

## 2020-09-17 DIAGNOSIS — Z781 Physical restraint status: Secondary | ICD-10-CM

## 2020-09-17 DIAGNOSIS — D539 Nutritional anemia, unspecified: Secondary | ICD-10-CM | POA: Diagnosis present

## 2020-09-17 DIAGNOSIS — R0902 Hypoxemia: Secondary | ICD-10-CM

## 2020-09-17 DIAGNOSIS — F17210 Nicotine dependence, cigarettes, uncomplicated: Secondary | ICD-10-CM | POA: Insufficient documentation

## 2020-09-17 DIAGNOSIS — I428 Other cardiomyopathies: Secondary | ICD-10-CM | POA: Diagnosis present

## 2020-09-17 DIAGNOSIS — K761 Chronic passive congestion of liver: Secondary | ICD-10-CM | POA: Diagnosis present

## 2020-09-17 DIAGNOSIS — I5043 Acute on chronic combined systolic (congestive) and diastolic (congestive) heart failure: Secondary | ICD-10-CM | POA: Diagnosis present

## 2020-09-17 DIAGNOSIS — Z978 Presence of other specified devices: Secondary | ICD-10-CM

## 2020-09-17 DIAGNOSIS — J440 Chronic obstructive pulmonary disease with acute lower respiratory infection: Secondary | ICD-10-CM | POA: Diagnosis not present

## 2020-09-17 DIAGNOSIS — J690 Pneumonitis due to inhalation of food and vomit: Secondary | ICD-10-CM | POA: Diagnosis not present

## 2020-09-17 DIAGNOSIS — I110 Hypertensive heart disease with heart failure: Secondary | ICD-10-CM | POA: Insufficient documentation

## 2020-09-17 DIAGNOSIS — Z7901 Long term (current) use of anticoagulants: Secondary | ICD-10-CM

## 2020-09-17 DIAGNOSIS — E876 Hypokalemia: Secondary | ICD-10-CM | POA: Diagnosis present

## 2020-09-17 DIAGNOSIS — J810 Acute pulmonary edema: Secondary | ICD-10-CM

## 2020-09-17 DIAGNOSIS — I480 Paroxysmal atrial fibrillation: Secondary | ICD-10-CM | POA: Insufficient documentation

## 2020-09-17 DIAGNOSIS — R410 Disorientation, unspecified: Secondary | ICD-10-CM | POA: Diagnosis not present

## 2020-09-17 DIAGNOSIS — R443 Hallucinations, unspecified: Secondary | ICD-10-CM | POA: Diagnosis not present

## 2020-09-17 DIAGNOSIS — I471 Supraventricular tachycardia: Secondary | ICD-10-CM | POA: Diagnosis present

## 2020-09-17 LAB — COMPREHENSIVE METABOLIC PANEL
ALT: 72 U/L — ABNORMAL HIGH (ref 0–44)
ALT: 71 U/L — ABNORMAL HIGH (ref 0–44)
AST: 71 U/L — ABNORMAL HIGH (ref 15–41)
AST: 77 U/L — ABNORMAL HIGH (ref 15–41)
Albumin: 3.4 g/dL — ABNORMAL LOW (ref 3.5–5.0)
Albumin: 3.3 g/dL — ABNORMAL LOW (ref 3.5–5.0)
Alkaline Phosphatase: 56 U/L (ref 38–126)
Alkaline Phosphatase: 57 U/L (ref 38–126)
Anion gap: 10 (ref 5–15)
Anion gap: 15 (ref 5–15)
BUN: 17 mg/dL (ref 6–20)
BUN: 19 mg/dL (ref 6–20)
CO2: 25 mmol/L (ref 22–32)
CO2: 19 mmol/L — ABNORMAL LOW (ref 22–32)
Calcium: 8.9 mg/dL (ref 8.9–10.3)
Calcium: 8.4 mg/dL — ABNORMAL LOW (ref 8.9–10.3)
Chloride: 103 mmol/L (ref 98–111)
Chloride: 104 mmol/L (ref 98–111)
Creatinine, Ser: 1.43 mg/dL — ABNORMAL HIGH (ref 0.61–1.24)
Creatinine, Ser: 1.9 mg/dL — ABNORMAL HIGH (ref 0.61–1.24)
GFR calc non Af Amer: >60 mL/min (ref >60)
GFR calc non Af Amer: 43 mL/min — ABNORMAL LOW (ref >60)
Glucose, Bld: 113 mg/dL — ABNORMAL HIGH (ref 70–99)
Glucose, Bld: 182 mg/dL — ABNORMAL HIGH (ref 70–99)
Potassium: 3.7 mmol/L (ref 3.5–5.1)
Potassium: 3.8 mmol/L (ref 3.5–5.1)
Sodium: 138 mmol/L (ref 135–145)
Sodium: 138 mmol/L (ref 135–145)
Total Bilirubin: 1.2 mg/dL (ref 0.3–1.2)
Total Bilirubin: 1.4 mg/dL — ABNORMAL HIGH (ref 0.3–1.2)
Total Protein: 7.4 g/dL (ref 6.5–8.1)
Total Protein: 7.1 g/dL (ref 6.5–8.1)

## 2020-09-17 LAB — ECHOCARDIOGRAM COMPLETE
Area-P 1/2: 5.02 cm2
Calc EF: 42.4 %
Height: 72 in
MV M vel: 4.24 m/s
MV Peak grad: 71.9 mmHg
S' Lateral: 4.4 cm
Single Plane A2C EF: 40.3 %
Single Plane A4C EF: 42.9 %
Weight: 4608 oz

## 2020-09-17 LAB — POCT I-STAT 7, (LYTES, BLD GAS, ICA,H+H)
Acid-base deficit: 4 mmol/L — ABNORMAL HIGH (ref 0.0–2.0)
Acid-base deficit: 1 mmol/L (ref 0.0–2.0)
Bicarbonate: 21.9 mmol/L (ref 20.0–28.0)
Bicarbonate: 24.2 mmol/L (ref 20.0–28.0)
Calcium, Ion: 1.16 mmol/L (ref 1.15–1.40)
Calcium, Ion: 1.11 mmol/L — ABNORMAL LOW (ref 1.15–1.40)
HCT: 46 % (ref 39.0–52.0)
HCT: 40 % (ref 39.0–52.0)
Hemoglobin: 15.6 g/dL (ref 13.0–17.0)
Hemoglobin: 13.6 g/dL (ref 13.0–17.0)
O2 Saturation: 94 %
O2 Saturation: 100 %
Potassium: 4.6 mmol/L (ref 3.5–5.1)
Potassium: 3.7 mmol/L (ref 3.5–5.1)
Sodium: 139 mmol/L (ref 135–145)
Sodium: 140 mmol/L (ref 135–145)
TCO2: 23 mmol/L (ref 22–32)
TCO2: 25 mmol/L (ref 22–32)
pCO2 arterial: 43.1 mmHg (ref 32.0–48.0)
pCO2 arterial: 39.8 mmHg (ref 32.0–48.0)
pH, Arterial: 7.314 — ABNORMAL LOW (ref 7.350–7.450)
pH, Arterial: 7.392 (ref 7.350–7.450)
pO2, Arterial: 77 mmHg — ABNORMAL LOW (ref 83.0–108.0)
pO2, Arterial: 328 mmHg — ABNORMAL HIGH (ref 83.0–108.0)

## 2020-09-17 LAB — CBC
HCT: 40.5 % (ref 39.0–52.0)
HCT: 39.5 % (ref 39.0–52.0)
Hemoglobin: 13.3 g/dL (ref 13.0–17.0)
Hemoglobin: 12.8 g/dL — ABNORMAL LOW (ref 13.0–17.0)
MCH: 33.5 pg (ref 26.0–34.0)
MCH: 32.5 pg (ref 26.0–34.0)
MCHC: 32.8 g/dL (ref 30.0–36.0)
MCHC: 32.4 g/dL (ref 30.0–36.0)
MCV: 102 fL — ABNORMAL HIGH (ref 80.0–100.0)
MCV: 100.3 fL — ABNORMAL HIGH (ref 80.0–100.0)
Platelets: 247 10*3/uL (ref 150–400)
Platelets: 275 10*3/uL (ref 150–400)
RBC: 3.97 MIL/uL — ABNORMAL LOW (ref 4.22–5.81)
RBC: 3.94 MIL/uL — ABNORMAL LOW (ref 4.22–5.81)
RDW: 13.3 % (ref 11.5–15.5)
RDW: 13.3 % (ref 11.5–15.5)
WBC: 5 10*3/uL (ref 4.0–10.5)
WBC: 5.9 10*3/uL (ref 4.0–10.5)
nRBC: 0 % (ref 0.0–0.2)
nRBC: 0.5 % — ABNORMAL HIGH (ref 0.0–0.2)

## 2020-09-17 LAB — COOXEMETRY PANEL
Carboxyhemoglobin: 0.8 % (ref 0.5–1.5)
Carboxyhemoglobin: 1.3 % (ref 0.5–1.5)
Methemoglobin: 1.1 % (ref 0.0–1.5)
Methemoglobin: 1.1 % (ref 0.0–1.5)
O2 Saturation: 44.4 %
O2 Saturation: 73.6 %
Total hemoglobin: 14.5 g/dL (ref 12.0–16.0)
Total hemoglobin: 13.8 g/dL (ref 12.0–16.0)

## 2020-09-17 LAB — MRSA PCR SCREENING: MRSA by PCR: NEGATIVE

## 2020-09-17 LAB — SARS CORONAVIRUS 2 BY RT PCR (HOSPITAL ORDER, PERFORMED IN ~~LOC~~ HOSPITAL LAB): SARS Coronavirus 2: NEGATIVE

## 2020-09-17 LAB — MAGNESIUM: Magnesium: 1.8 mg/dL (ref 1.7–2.4)

## 2020-09-17 LAB — LACTIC ACID, PLASMA
Lactic Acid, Venous: 2.8 mmol/L (ref 0.5–1.9)
Lactic Acid, Venous: 1.8 mmol/L (ref 0.5–1.9)

## 2020-09-17 LAB — BRAIN NATRIURETIC PEPTIDE: B Natriuretic Peptide: 1001 pg/mL — ABNORMAL HIGH (ref 0.0–100.0)

## 2020-09-17 LAB — GLUCOSE, CAPILLARY
Glucose-Capillary: 104 mg/dL — ABNORMAL HIGH (ref 70–99)
Glucose-Capillary: 53 mg/dL — ABNORMAL LOW (ref 70–99)
Glucose-Capillary: 99 mg/dL (ref 70–99)
Glucose-Capillary: 99 mg/dL (ref 70–99)

## 2020-09-17 LAB — HIV ANTIBODY (ROUTINE TESTING W REFLEX): HIV Screen 4th Generation wRfx: NONREACTIVE

## 2020-09-17 LAB — TSH: TSH: 4.471 u[IU]/mL (ref 0.350–4.500)

## 2020-09-17 MED ORDER — NOREPINEPHRINE 4 MG/250ML-% IV SOLN
2.0000 ug/min | INTRAVENOUS | Status: DC
  Filled 2020-09-17: qty 250

## 2020-09-17 MED ORDER — ADULT MULTIVITAMIN LIQUID CH
15.0000 mL | Freq: Every day | ORAL | Status: DC
  Administered 2020-09-18 – 2020-09-24 (×7): 15 mL
  Filled 2020-09-17 (×7): qty 15

## 2020-09-17 MED ORDER — DOCUSATE SODIUM 50 MG/5ML PO LIQD
100.0000 mg | Freq: Two times a day (BID) | ORAL | Status: DC
  Administered 2020-09-18 – 2020-09-23 (×9): 100 mg
  Filled 2020-09-17 (×9): qty 10

## 2020-09-17 MED ORDER — FENTANYL BOLUS VIA INFUSION
50.0000 ug | INTRAVENOUS | Status: DC | PRN
  Administered 2020-09-18: 50 ug via INTRAVENOUS
  Filled 2020-09-17: qty 50

## 2020-09-17 MED ORDER — AMIODARONE HCL IN DEXTROSE 360-4.14 MG/200ML-% IV SOLN
60.0000 mg/h | INTRAVENOUS | Status: AC
  Administered 2020-09-17: 60 mg/h via INTRAVENOUS
  Filled 2020-09-17: qty 200

## 2020-09-17 MED ORDER — AMIODARONE HCL IN DEXTROSE 360-4.14 MG/200ML-% IV SOLN
30.0000 mg/h | INTRAVENOUS | Status: DC
  Administered 2020-09-17 – 2020-09-25 (×16): 30 mg/h via INTRAVENOUS
  Administered 2020-09-25: 60 mg/h via INTRAVENOUS
  Administered 2020-09-25: 30 mg/h via INTRAVENOUS
  Administered 2020-09-26 (×4): 60 mg/h via INTRAVENOUS
  Administered 2020-09-27: 30 mg/h via INTRAVENOUS
  Administered 2020-09-27: 60 mg/h via INTRAVENOUS
  Administered 2020-09-27 – 2020-09-29 (×4): 30 mg/h via INTRAVENOUS
  Filled 2020-09-17 (×28): qty 200

## 2020-09-17 MED ORDER — POTASSIUM CHLORIDE 20 MEQ PO PACK
40.0000 meq | PACK | Freq: Once | ORAL | Status: AC
  Administered 2020-09-17: 40 meq
  Filled 2020-09-17: qty 2

## 2020-09-17 MED ORDER — ENOXAPARIN SODIUM 40 MG/0.4ML ~~LOC~~ SOLN
40.0000 mg | SUBCUTANEOUS | Status: DC
  Administered 2020-09-17: 40 mg via SUBCUTANEOUS
  Filled 2020-09-17: qty 0.4

## 2020-09-17 MED ORDER — MILRINONE LACTATE IN DEXTROSE 20-5 MG/100ML-% IV SOLN
0.2500 ug/kg/min | INTRAVENOUS | Status: DC
  Administered 2020-09-17 – 2020-09-19 (×5): 0.25 ug/kg/min via INTRAVENOUS
  Filled 2020-09-17 (×5): qty 100

## 2020-09-17 MED ORDER — FENTANYL 2500MCG IN NS 250ML (10MCG/ML) PREMIX INFUSION: 50.0000 ug/h | INTRAVENOUS | Status: DC

## 2020-09-17 MED ORDER — POLYETHYLENE GLYCOL 3350 17 G PO PACK
17.0000 g | PACK | Freq: Every day | ORAL | Status: DC
  Administered 2020-09-18 – 2020-09-23 (×3): 17 g
  Filled 2020-09-17 (×3): qty 1

## 2020-09-17 MED ORDER — AMIODARONE HCL IN DEXTROSE 360-4.14 MG/200ML-% IV SOLN
INTRAVENOUS | Status: AC
  Filled 2020-09-17: qty 200

## 2020-09-17 MED ORDER — POTASSIUM CHLORIDE CRYS ER 20 MEQ PO TBCR
20.0000 meq | EXTENDED_RELEASE_TABLET | Freq: Once | ORAL | Status: AC
  Administered 2020-09-17: 20 meq via ORAL
  Filled 2020-09-17: qty 1

## 2020-09-17 MED ORDER — ETOMIDATE 2 MG/ML IV SOLN
INTRAVENOUS | Status: DC | PRN
  Administered 2020-09-17: 20 mg via INTRAVENOUS

## 2020-09-17 MED ORDER — NOREPINEPHRINE 16 MG/250ML-% IV SOLN
1.0000 ug/min | INTRAVENOUS | Status: DC
  Administered 2020-09-17: 5 ug/min via INTRAVENOUS
  Administered 2020-09-18: 13 ug/min via INTRAVENOUS
  Administered 2020-09-19: 20 ug/min via INTRAVENOUS
  Administered 2020-09-20: 12 ug/min via INTRAVENOUS
  Administered 2020-09-22: 4 ug/min via INTRAVENOUS
  Filled 2020-09-17 (×5): qty 250

## 2020-09-17 MED ORDER — ADENOSINE 6 MG/2ML IV SOLN
6.0000 mg | Freq: Once | INTRAVENOUS | Status: AC
  Administered 2020-09-17: 6 mg via INTRAVENOUS

## 2020-09-17 MED ORDER — ADENOSINE 6 MG/2ML IV SOLN
12.0000 mg | Freq: Once | INTRAVENOUS | Status: AC
  Administered 2020-09-17: 12 mg via INTRAVENOUS

## 2020-09-17 MED ORDER — FENTANYL CITRATE (PF) 100 MCG/2ML IJ SOLN
INTRAMUSCULAR | Status: AC
  Administered 2020-09-17: 100 ug via INTRAVENOUS
  Filled 2020-09-17: qty 2

## 2020-09-17 MED ORDER — CHLORHEXIDINE GLUCONATE CLOTH 2 % EX PADS
6.0000 | MEDICATED_PAD | Freq: Every day | CUTANEOUS | Status: DC
  Administered 2020-09-17 – 2020-09-30 (×14): 6 via TOPICAL

## 2020-09-17 MED ORDER — DOCUSATE SODIUM 50 MG/5ML PO LIQD
100.0000 mg | Freq: Two times a day (BID) | ORAL | Status: DC
  Administered 2020-09-17: 100 mg via ORAL
  Filled 2020-09-17: qty 10

## 2020-09-17 MED ORDER — SODIUM CHLORIDE 0.9 % IV SOLN
250.0000 mL | INTRAVENOUS | Status: DC | PRN
  Administered 2020-09-20: 250 mL via INTRAVENOUS

## 2020-09-17 MED ORDER — PROPOFOL 1000 MG/100ML IV EMUL
INTRAVENOUS | Status: AC
  Administered 2020-09-17: 20 ug/kg/min via INTRAVENOUS
  Filled 2020-09-17: qty 100

## 2020-09-17 MED ORDER — DIGOXIN 125 MCG PO TABS: 0.1250 mg | ORAL_TABLET | Freq: Every day | ORAL | Status: DC

## 2020-09-17 MED ORDER — POLYETHYLENE GLYCOL 3350 17 G PO PACK: 17.0000 g | PACK | Freq: Every day | ORAL | Status: DC

## 2020-09-17 MED ORDER — SODIUM CHLORIDE 0.9 % IV SOLN
250.0000 mL | INTRAVENOUS | Status: DC
  Administered 2020-09-18 – 2020-09-23 (×3): 250 mL via INTRAVENOUS

## 2020-09-17 MED ORDER — SODIUM CHLORIDE 0.9% FLUSH
10.0000 mL | Freq: Two times a day (BID) | INTRAVENOUS | Status: DC
  Administered 2020-09-17 – 2020-09-22 (×8): 10 mL
  Administered 2020-09-23: 40 mL
  Administered 2020-09-23 – 2020-09-29 (×6): 10 mL

## 2020-09-17 MED ORDER — FUROSEMIDE 10 MG/ML IJ SOLN: 80.0000 mg | Freq: Once | INTRAMUSCULAR | Status: DC

## 2020-09-17 MED ORDER — CALCIUM GLUCONATE-NACL 1-0.675 GM/50ML-% IV SOLN
1.0000 g | Freq: Once | INTRAVENOUS | Status: AC
  Administered 2020-09-17: 1000 mg via INTRAVENOUS

## 2020-09-17 MED ORDER — ACETAMINOPHEN 160 MG/5ML PO SOLN
650.0000 mg | ORAL | Status: DC | PRN
  Administered 2020-09-19 – 2020-09-24 (×6): 650 mg
  Filled 2020-09-17 (×6): qty 20.3

## 2020-09-17 MED ORDER — FUROSEMIDE 10 MG/ML IJ SOLN
80.0000 mg | Freq: Two times a day (BID) | INTRAMUSCULAR | Status: DC
  Administered 2020-09-17: 80 mg via INTRAVENOUS
  Filled 2020-09-17 (×2): qty 8

## 2020-09-17 MED ORDER — SUCCINYLCHOLINE CHLORIDE 200 MG/10ML IV SOSY
PREFILLED_SYRINGE | INTRAVENOUS | Status: DC | PRN
  Administered 2020-09-17: 160 mg via INTRAVENOUS

## 2020-09-17 MED ORDER — CHLORHEXIDINE GLUCONATE 0.12% ORAL RINSE (MEDLINE KIT)
15.0000 mL | Freq: Two times a day (BID) | OROMUCOSAL | Status: DC
  Administered 2020-09-17 – 2020-09-24 (×14): 15 mL via OROMUCOSAL

## 2020-09-17 MED ORDER — SODIUM CHLORIDE 0.9% FLUSH: 10.0000 mL | INTRAVENOUS | Status: DC | PRN

## 2020-09-17 MED ORDER — AMIODARONE HCL 150 MG/3ML IV SOLN
300.0000 mg | Freq: Once | INTRAVENOUS | Status: AC
  Administered 2020-09-17: 300 mg via INTRAVENOUS
  Filled 2020-09-17: qty 6

## 2020-09-17 MED ORDER — ORAL CARE MOUTH RINSE
15.0000 mL | OROMUCOSAL | Status: DC
  Administered 2020-09-17 – 2020-09-24 (×66): 15 mL via OROMUCOSAL

## 2020-09-17 MED ORDER — ACETAMINOPHEN 325 MG PO TABS: 650.0000 mg | ORAL_TABLET | ORAL | Status: DC | PRN

## 2020-09-17 MED ORDER — AMIODARONE IV BOLUS ONLY 150 MG/100ML: 150.0000 mg | Freq: Once | INTRAVENOUS | Status: DC

## 2020-09-17 MED ORDER — SODIUM CHLORIDE 0.9% FLUSH
3.0000 mL | Freq: Two times a day (BID) | INTRAVENOUS | Status: DC
  Administered 2020-09-18 – 2020-09-19 (×2): 3 mL via INTRAVENOUS
  Administered 2020-09-19: 10 mL via INTRAVENOUS
  Administered 2020-09-20 – 2020-09-29 (×14): 3 mL via INTRAVENOUS

## 2020-09-17 MED ORDER — MIDAZOLAM BOLUS VIA INFUSION
1.0000 mg | INTRAVENOUS | Status: DC | PRN
  Filled 2020-09-17: qty 2

## 2020-09-17 MED ORDER — MIDAZOLAM 50MG/50ML (1MG/ML) PREMIX INFUSION
0.0000 mg/h | INTRAVENOUS | Status: DC
  Administered 2020-09-17: 2 mg/h via INTRAVENOUS
  Administered 2020-09-17: 4 mg/h via INTRAVENOUS
  Filled 2020-09-17 (×2): qty 50

## 2020-09-17 MED ORDER — FENTANYL CITRATE (PF) 100 MCG/2ML IJ SOLN: 50.0000 ug | Freq: Once | INTRAMUSCULAR | Status: AC

## 2020-09-17 MED ORDER — FOLIC ACID 1 MG PO TABS
1.0000 mg | ORAL_TABLET | Freq: Every day | ORAL | Status: DC
  Administered 2020-09-18 – 2020-09-24 (×7): 1 mg
  Filled 2020-09-17 (×7): qty 1

## 2020-09-17 MED ORDER — MAGNESIUM SULFATE 2 GM/50ML IV SOLN
2.0000 g | Freq: Once | INTRAVENOUS | Status: AC
  Administered 2020-09-17: 2 g via INTRAVENOUS
  Filled 2020-09-17: qty 50

## 2020-09-17 MED ORDER — MIDAZOLAM HCL 2 MG/2ML IJ SOLN
INTRAMUSCULAR | Status: AC
  Administered 2020-09-17: 2 mg
  Filled 2020-09-17: qty 2

## 2020-09-17 MED ORDER — FUROSEMIDE 10 MG/ML IJ SOLN
INTRAMUSCULAR | Status: AC
  Administered 2020-09-17: 80 mg via INTRAVENOUS
  Filled 2020-09-17: qty 8

## 2020-09-17 MED ORDER — THIAMINE HCL 100 MG PO TABS
100.0000 mg | ORAL_TABLET | Freq: Every day | ORAL | Status: DC
  Administered 2020-09-18 – 2020-09-24 (×7): 100 mg
  Filled 2020-09-17 (×7): qty 1

## 2020-09-17 MED ORDER — PROPOFOL 10 MG/ML IV BOLUS
INTRAVENOUS | Status: DC | PRN
  Administered 2020-09-17: 30 mg via INTRAVENOUS
  Administered 2020-09-17: 50 mg via INTRAVENOUS

## 2020-09-17 MED ORDER — SODIUM CHLORIDE 0.9% FLUSH: 3.0000 mL | INTRAVENOUS | Status: DC | PRN

## 2020-09-17 MED ORDER — PANTOPRAZOLE SODIUM 40 MG IV SOLR
40.0000 mg | Freq: Every day | INTRAVENOUS | Status: DC
  Administered 2020-09-17 – 2020-09-23 (×7): 40 mg via INTRAVENOUS
  Filled 2020-09-17 (×7): qty 40

## 2020-09-17 MED ORDER — AMIODARONE HCL 150 MG/3ML IV SOLN
150.0000 mg | Freq: Once | INTRAVENOUS | Status: AC
  Administered 2020-09-17: 150 mg via INTRAVENOUS
  Filled 2020-09-17: qty 3

## 2020-09-17 MED ORDER — PROPOFOL 1000 MG/100ML IV EMUL
0.0000 ug/kg/min | INTRAVENOUS | Status: DC
  Administered 2020-09-17: 15 ug/kg/min via INTRAVENOUS
  Administered 2020-09-17 – 2020-09-18 (×6): 25 ug/kg/min via INTRAVENOUS
  Administered 2020-09-19 (×3): 20 ug/kg/min via INTRAVENOUS
  Administered 2020-09-19: 25 ug/kg/min via INTRAVENOUS
  Administered 2020-09-20 – 2020-09-21 (×5): 20 ug/kg/min via INTRAVENOUS
  Administered 2020-09-21 (×3): 25 ug/kg/min via INTRAVENOUS
  Administered 2020-09-21: 25.013 ug/kg/min via INTRAVENOUS
  Administered 2020-09-22 (×4): 25 ug/kg/min via INTRAVENOUS
  Administered 2020-09-23 (×3): 20 ug/kg/min via INTRAVENOUS
  Administered 2020-09-23: 20.036 ug/kg/min via INTRAVENOUS
  Administered 2020-09-24 (×2): 20 ug/kg/min via INTRAVENOUS
  Filled 2020-09-17 (×31): qty 100

## 2020-09-17 MED ORDER — ONDANSETRON HCL 4 MG/2ML IJ SOLN: 4.0000 mg | Freq: Four times a day (QID) | INTRAMUSCULAR | Status: DC | PRN

## 2020-09-17 MED ORDER — IPRATROPIUM-ALBUTEROL 0.5-2.5 (3) MG/3ML IN SOLN
3.0000 mL | Freq: Four times a day (QID) | RESPIRATORY_TRACT | Status: DC
  Administered 2020-09-17 – 2020-09-24 (×30): 3 mL via RESPIRATORY_TRACT
  Filled 2020-09-17 (×31): qty 3

## 2020-09-17 MED ORDER — INSULIN ASPART 100 UNIT/ML ~~LOC~~ SOLN
2.0000 [IU] | SUBCUTANEOUS | Status: DC
  Administered 2020-09-19 – 2020-09-20 (×8): 2 [IU] via SUBCUTANEOUS
  Administered 2020-09-21: 4 [IU] via SUBCUTANEOUS
  Administered 2020-09-21 – 2020-09-22 (×2): 2 [IU] via SUBCUTANEOUS
  Administered 2020-09-22: 4 [IU] via SUBCUTANEOUS
  Administered 2020-09-22 – 2020-09-23 (×4): 2 [IU] via SUBCUTANEOUS
  Administered 2020-09-24: 4 [IU] via SUBCUTANEOUS
  Administered 2020-09-28: 2 [IU] via SUBCUTANEOUS

## 2020-09-17 MED ORDER — FENTANYL 2500MCG IN NS 250ML (10MCG/ML) PREMIX INFUSION
50.0000 ug/h | INTRAVENOUS | Status: DC
  Administered 2020-09-18: 200 ug/h via INTRAVENOUS
  Administered 2020-09-18 – 2020-09-19 (×2): 175 ug/h via INTRAVENOUS
  Administered 2020-09-20 – 2020-09-23 (×4): 125 ug/h via INTRAVENOUS
  Administered 2020-09-24: 100 ug/h via INTRAVENOUS
  Filled 2020-09-17 (×10): qty 250

## 2020-09-17 MED ORDER — FUROSEMIDE 10 MG/ML IJ SOLN
80.0000 mg | Freq: Once | INTRAMUSCULAR | Status: AC
  Administered 2020-09-17: 80 mg via INTRAVENOUS
  Filled 2020-09-17: qty 8

## 2020-09-17 MED ORDER — NOREPINEPHRINE 4 MG/250ML-% IV SOLN
INTRAVENOUS | Status: AC
  Filled 2020-09-17: qty 250

## 2020-09-17 MED FILL — Medication: Qty: 1 | Status: AC

## 2020-09-17 NOTE — Consult Note (Signed)
NAME:  Antonio Tapia, MRN:  [id number], DOB:  1981/12/04, LOS: 0 ADMISSION DATE:  09/17/2020, CONSULTATION DATE:  10/05 REFERRING MD:  Dr Gala Romney CHIEF COMPLAINT:  SVT   Brief History    Antonio Tapia is a 39 yr old male with PMH EOTH abuse, A flutter, tobacco abuse, chronic systolic heart failure admitted on 10/5 for SVT. Now in cardiogenic shock.  History of present illness    Hx obtained per chart review   Pt seen in the ED on 09/19/18 with palpitations. States prior to this he had been having palpitations 4-5 days prior to this.  EKG with A flutter RVR. Discharged on lopressor and Xarelto. Referred to A fib clinic.  10/04/2018 seen in ED again, cardiology consulted. EF 30-35%.  Presents today at HF clinic, has not been seen in a year with HR 180-195 bpm. Feeling terrible and states he has not felt this bad before. Endorses orthopnea, dyspnea on exertion and speaking. Weight increased to 20 lb. Directly admitted to the hospital from HF clinic for SVT management. Upon on arrival. EKG: SVT, HR 190s, s/p Adenosine 6mg , 6mg  and 12mg . Transferred to ICU for cardioversion. No improvement in HR and rhythm.  Received Amiodarone 150mg  and 300mg . Pt was intubated. Received versed, fentanyl, levophed and propofol.  Labs on arrival: BNP> 1000, svo2 44, K 3.8, Cr 1.9, ionized calcium 1.11  Past Medical History   EOTH abuse A flutter Tobacco abuse Chronic systolic heart failure HTN   Significant Hospital Events   10/5: Admitted, cardioversion, intubated  Consults:  HF    Procedures:  10/05 Intubation, CVC insertion, arterial line   Significant Diagnostic Tests:   Echo in 2019: EF 30-35%, systolic functionmoderately to severely reduced, Diffuse hypokinesis. Right ventricular systolic presure 53 mmHg, suggesting at least moderate pulmonary hypertension.  Echo pending for 10/5  CXR: ETT tip terminates 3.9 cm above the carina. A RIGHT IJ CVC tip terminates over the superior cavoatrial  junction. Right sided pleural effusion. Infection Vs pulmonary edema     Micro Data:  COVID neg   Antimicrobials:  n/a  Interim history/subjective:   Pt presented with SVT, refractory to adenosine. Transferred to ICU and intubated for cardioversion.   Objective   Blood pressure 107/77, pulse 91, resp. rate 20, height 6' (1.829 m), SpO2 100 %.    Vent Mode: PRVC FiO2 (%):  [50 %-100 %] 50 % Set Rate:  [16 bmp-24 bmp] 24 bmp Vt Set:  [620 mL] 620 mL PEEP:  [5 cmH20-14 cmH20] 14 cmH20 Plateau Pressure:  [25 cmH20] 25 cmH20  No intake or output data in the 24 hours ending 09/17/20 1453 There were no vitals filed for this visit.  Examination: General: unwell appearing, 39 yr old african male, obese, intubated HEENT: NCAT, no lymphadenopathy or thyromegaly appreciated  Lungs: no crackles or wheeze, no secretions  Cardiovascular: S1 and S2, no murmurs, tachycardic  Abdomen: non distended, soft, bowel sounds present  Extremities: 2+ pitting peripheral edema, no cyanosis or clubbing  Neuro: pupils 65mm bilaterally, reactive to light, sluggish. Corneal reflex not present, does not withdraw to pain UE or LE GU: foley catheter in situ  Skin: warm, dry, no rashes  Resolved Hospital Problem list   N/a  Assessment & Plan:     Shock, suspect cardiogenic  Persistent hypotensive  SBP< 80, sVO2 44.4  -Continue Levophed MAP <65 and titrate according to Bps -STAT echo today  -Milrinone gtt per HF   -Trending co-ox qshift  -CVP monitoring  Acute hypoxic respiratory failure requiring mechanical ventilation CXR: right sided pleural effusion and bilateral opacities. Pulmonary edema likely to CHF more likely.  Infection less likely, COVID negative, no documented fevers, WBC 5.9. Pt intubated for cardioversion in ICU.   -Continue mechanical ventilation -ABG 1 hour after initiation of mechanical ventilation  -Sedation: propofol, versed. Wean as able. -Maintain sats > 90% -Continue  Amiodarone gtt -Daily SBTs and wake up assessments  -VAP bundle   Acute ex HFrEF  Echo 30-35% 2019. Right ventricular systolic presure 53 mmHg, suggesting at least moderate pulmonary hypertension. BNP> 1000.  -HF following, appreciate recs -Stricts Is and Os -Lasix 80mg  BID IV  SVT Refractory to adenosine, cardioverted in ICU.  -Continuous telemetry -Continue Amiodarone gtt -ICU monitoring   AKI  Cr 1.9. Likely pre-renal injury in setting of significant hypotension  -Avoid nephrotoxic agents -Monitor with CMP  -Caution IV fluids due to cardiogenic shock   Elevated transaminases Likely due to HFrEF and ETOH abuse -Monitor with CMP and INR   Hypokalemia Hypomagnesemia  Hypocalcemia -Monitor with daily Mg, Ca and K -K goal> 4 -Mg goal >2 -Replete as necessary   Anemia  Hb 12.8, likely due to chronic disease -Monitor with daily CBC -Monitor for signs of bleeding -Hb threshold for transfusion Hb<8   Hx of HTN  -Monitor Bps   Hx of EOTH abuse -Monitor for sign and symptoms of withdrawal  -Sedation with midazolam, propofol  -Protocol per PAD   Tobacco abuse -Nicotine patch -Encourage smoking cessation   Best practice:   Diet: NPO  Pain/Anxiety/Delirium protocol (if indicated): Fentanyl, propofol, versed   VAP protocol (if indicated):  DVT prophylaxis: Lovenox  GI prophylaxis: Protonix  Glucose control:  Mobility: bed rest Code Status: FULL  Family Communication:  Disposition: ICU   Labs   CBC: Recent Labs  Lab 09/17/20 0904 09/17/20 1212 09/17/20 1310 09/17/20 1403  WBC 5.0  --  5.9  --   HGB 13.3 15.6 12.8* 13.6  HCT 40.5 46.0 39.5 40.0  MCV 102.0*  --  100.3*  --   PLT 247  --  275  --     Basic Metabolic Panel: Recent Labs  Lab 09/17/20 0904 09/17/20 1212 09/17/20 1310 09/17/20 1403  NA 138 139 138 140  K 3.7 4.6 3.8 3.7  CL 103  --  104  --   CO2 25  --  19*  --   GLUCOSE 113*  --  182*  --   BUN 17  --  19  --   CREATININE  1.43*  --  1.90*  --   CALCIUM 8.9  --  8.4*  --    GFR: Estimated Creatinine Clearance: 72.9 mL/min (A) (by C-G formula based on SCr of 1.9 mg/dL (H)). Recent Labs  Lab 09/17/20 0904 09/17/20 1310  WBC 5.0 5.9  LATICACIDVEN  --  2.8*    Liver Function Tests: Recent Labs  Lab 09/17/20 0904 09/17/20 1310  AST 71* 77*  ALT 72* 71*  ALKPHOS 56 57  BILITOT 1.2 1.4*  PROT 7.4 7.1  ALBUMIN 3.4* 3.3*   No results for input(s): LIPASE, AMYLASE in the last 168 hours. No results for input(s): AMMONIA in the last 168 hours.  ABG    Component Value Date/Time   PHART 7.392 09/17/2020 1403   PCO2ART 39.8 09/17/2020 1403   PO2ART 328 (H) 09/17/2020 1403   HCO3 24.2 09/17/2020 1403   TCO2 25 09/17/2020 1403   ACIDBASEDEF 1.0 09/17/2020 1403  O2SAT 100.0 09/17/2020 1403     Coagulation Profile: No results for input(s): INR, PROTIME in the last 168 hours.  Cardiac Enzymes: No results for input(s): CKTOTAL, CKMB, CKMBINDEX, TROPONINI in the last 168 hours.  HbA1C: Hgb A1c MFr Bld  Date/Time Value Ref Range Status  10/12/2018 03:35 PM 5.8 (H) 4.8 - 5.6 % Final    Comment:             Prediabetes: 5.7 - 6.4          Diabetes: >6.4          Glycemic control for adults with diabetes: <7.0     CBG: No results for input(s): GLUCAP in the last 168 hours.  Review of Systems:     Past Medical History  He,  has a past medical history of Acute congestive heart failure (HCC), Alcohol use, Atrial flutter (HCC), Essential hypertension, and Tobacco use.   Surgical History   No past surgical history on file.   Social History   reports that he has been smoking. He has never used smokeless tobacco. He reports current alcohol use. He reports that he does not use drugs.   Family History   His family history includes Breast cancer in his paternal grandfather; Diabetes in his paternal grandmother; Hypertension in his father and mother.   Allergies No Known Allergies   Home  Medications  Prior to Admission medications   Not on File     Critical care time:

## 2020-09-17 NOTE — Progress Notes (Signed)
Heart and Vascular Care Navigation  09/17/2020  Antonio Tapia 01-18-1981 [id number]  Reason for Referral: CSW was consulted due to concerns with lack of insurance and alcohol abuse.                                                                                                      Assessment:  CSW met with pt in clinic to discuss above concerns.  Pt reports that he had originally lost his job back in march and tried working again after that but had to stop in June due to medical concerns.  He has not felt up to working since that time.  Has considered applying for disability but had not initiated anything at this time.  CSW then discussed pt current alcohol use with patient.  Patient reports he drinks about 1 pint of alcohol everyday.  States that he was drinking even more than that over the summer but had to reduce his intake because he got a DUI and his car had a breathalyzer installed so he can't drive with any alcohol in his system.  States this helped him to reduce his drinking so that he could get around during the day.  Pt expresses understanding that his alcohol use is affecting his life and his health negatively but he is not sure he wants to quit entirely so he is unsure how he feels about going to rehab or attending outpatient programming.  CSW explained that goal of an outpatient program doesn't necessarily have to be quitting entirely but just to help him reduce how much he is drinking.  Pt acknowledges that he drinks to self sooth his anxiety and depression and CSW discussed how he could benefit from learning other coping mechanisms.  CSW asked pt how he deals with anxiety and depression during the day when he is not drinking.  Pt states that finds chores to do, watches TV, or finding another way to distract himself.                                     HRT/VAS Care Coordination    Outpatient Care Team Social Worker   Social Worker Name: Tammy Sours- [facility]- [phone]    Living arrangements for the past 2 months Single Family Home   Lives with: Significant Other   Patient Current Insurance Coverage Self-Pay   Patient Has Concern With Paying Medical Bills Yes   Patient Concerns With Medical Bills no insurance   Medical Bill Referrals: asking financial counselors to review for potential medicaid   Does Patient Have Prescription Coverage? No   Patient Prescription Assistance Programs [facility]      Social History:  SDOH Screenings   Alcohol Screen: Medium Risk  . Last Alcohol Screening Score (AUDIT): 11  Depression (PHQ2-9):   . PHQ-2 Score: Not on file  Financial Resource Strain: Low Risk   . Difficulty of Paying Living Expenses: Not very hard  Food Insecurity: No Food Insecurity  . Worried About Charity fundraiser in the Last Year: Never true  . Ran Out of Food in the Last Year: Never true  Housing: Low Risk   . Last Housing Risk Score: 0  Physical Activity:   . Days of Exercise per Week: Not on file  . Minutes of Exercise per Session: Not on file  Social Connections:   . Frequency of Communication with Friends and Family: Not on file  . Frequency of Social Gatherings with Friends and Family: Not on file  . Attends Religious Services: Not on file  . Active Member of Clubs or Organizations: Not on file  . Attends Archivist Meetings: Not on file  . Marital Status: Not on file  Stress:   . Feeling of Stress : Not on file  Tobacco Use:   . Smoking Tobacco Use: Not on file  . Smokeless Tobacco Use: Not on file  Transportation Needs: No Transportation Needs  . Lack of Transportation (Medical): No  . Lack of Transportation (Non-Medical): No    SDOH Interventions: Financial Resources:  Sales promotion account executive Interventions: Intervention Not Indicated   Food Insecurity:  Food Insecurity Interventions: Intervention Not Indicated  Housing Insecurity:  Housing  Interventions: Intervention Not Indicated  Transportation:   Transportation Interventions: Intervention Not Indicated    Other Care Navigation Interventions:     Inpatient/Outpatient Substance Abuse Counseling/Rehab Options CSW and pt will plan to discuss further during pt hospital stay- pt agreeable to considering outpatient treatment.  Provided Pharmacy assistance resources [facility] will also evaluate for patient assistant programs if   Patient expressed Mental Health concerns Anxiety and depression  Patient Referred to: Will work with pt while inpatient to refer   Follow-up plan:    CSW to follow inpatient and help connect with outpatient therapy/rehab resources  Jorge Ny, [facility] Desk#: [phone] Cell#: [phone]

## 2020-09-17 NOTE — H&P (Addendum)
ADVANCED HEART FAILURE H&P  This note reflects work completed today.   HF MD: Dr Gala Romney   Antonio Tapia is a 39 y.o. male  with a history of ETOH abuse, A flutter, tobacco abuse,  and chronic systolic heart failure.   He was evaluated in [facility] ED on 09/19/18 with palpitations. He had been having palpitations 4-5 days prior to visit.. EKG showed A flutter RVR with rate in the 130s. He spontaneously converted and discharged on lopressor and xarelto. He was referred to A fib clinic and was seen on 09/22/18. At that time he was continued on lopressor. Sleep study was recommended but not completed because he does not have insurance.   He returned to the [facility] ED on 10/04/2018 with chest pain. Troponin was negative. Cardiology was consulted. He has mild volume overload and started on po lasix. On 10/23 he had an ECHO that showed reduced EF 30-35%. He was referred to HF clinic.   Today he returns for HF follow up.He has not been seen in > 1 year. Overall feeling terrible. Says he has never felt this bad before. SOB with exertion and talking. + Orthopnea. Appetite fair. No fever or chills. Weight at home has gone up >20  pounds. Unable to pay for meds. Lost his job.   SH: Smokes 1/2 PPD, drinks at least 1 pint of liquor per day.  Unemployed. Takes the bus to appointments. He has difficulty paying for medications.   FH: no family of CAD or heart failure.   ECHO 10/05/2018  Right ventricular systolic presure 53 mmHg, suggesting at least moderate pulmonary hypertension. Reduced LV ejection fraction 30-35% with global hypokinesis.  Review of systems complete and found to be negative unless listed in HPI.        Past Medical History:  Diagnosis Date  . Acute congestive heart failure (HCC)   . Alcohol use   . Atrial flutter (HCC)   . Essential hypertension   . Tobacco use           Current Outpatient Medications  Medication Sig Dispense Refill  . isosorbide-hydrALAZINE (BIDIL)  20-37.5 MG tablet Take 1 tablet by mouth 3 (three) times daily. 90 tablet 6  . Multiple Vitamin (MULTIVITAMIN) capsule Take 1 capsule by mouth daily.    . rivaroxaban (XARELTO) 20 MG TABS tablet Take 1 tablet (20 mg total) by mouth daily with supper. 90 tablet 3  . sacubitril-valsartan (ENTRESTO) 24-26 MG Take 1 tablet by mouth 2 (two) times daily. 60 tablet 1   No current facility-administered medications for this encounter.   No Known Allergies  Social History        Socioeconomic History  . Marital status: Single    Spouse name: Not on file  . Number of children: Not on file  . Years of education: Not on file  . Highest education level: Not on file  Occupational History  . Not on file  Tobacco Use  . Smoking status: Current Every Day Smoker  . Smokeless tobacco: Never Used  Substance and Sexual Activity  . Alcohol use: Yes  . Drug use: No  . Sexual activity: Not on file  Other Topics Concern  . Not on file  Social History Narrative  . Not on file   Social Determinants of Health      Financial Resource Strain:   . Difficulty of Paying Living Expenses: Not on file  Food Insecurity:   . Worried About Programme researcher, broadcasting/film/video in the Last Year:  Not on file  . Ran Out of Food in the Last Year: Not on file  Transportation Needs:   . Lack of Transportation (Medical): Not on file  . Lack of Transportation (Non-Medical): Not on file  Physical Activity:   . Days of Exercise per Week: Not on file  . Minutes of Exercise per Session: Not on file  Stress:   . Feeling of Stress : Not on file  Social Connections:   . Frequency of Communication with Friends and Family: Not on file  . Frequency of Social Gatherings with Friends and Family: Not on file  . Attends Religious Services: Not on file  . Active Member of Clubs or Organizations: Not on file  . Attends Banker Meetings: Not on file  . Marital Status: Not on file  Intimate Partner Violence:   . Fear of  Current or Ex-Partner: Not on file  . Emotionally Abused: Not on file  . Physically Abused: Not on file  . Sexually Abused: Not on file         Family History  Problem Relation Age of Onset  . Hypertension Mother   . Hypertension Father   . Diabetes Paternal Grandmother   . Breast cancer Paternal Grandfather       Vitals:   09/17/20 0840  BP: (!) 145/100  Pulse: (!) 117  SpO2: 99%  Weight: 130.6 kg (288 lb)       Wt Readings from Last 3 Encounters:  09/17/20 130.6 kg (288 lb)  12/15/18 118.7 kg (261 lb 9.6 oz)  11/17/18 118.3 kg (260 lb 12.8 oz)     PHYSICAL EXAM: General:  Appears anxious. Dyspneic talking.  HEENT: normal Neck: supple. JVP to jaw. Carotids 2+ bilat; no bruits. No lymphadenopathy or thryomegaly appreciated. Cor: PMI nondisplaced. Tachy/Regular rate & rhythm. No rubs or murmurs. + S3  Lungs: clear Abdomen: soft, nontender, nondistended. No hepatosplenomegaly. No bruits or masses. Good bowel sounds. Extremities: no cyanosis, clubbing, rash, R and LLE 1-2+ edema Neuro: alert & orientedx3, cranial nerves grossly intact. moves all 4 extremities w/o difficulty. Affect pleasant  EKG: ST 110 bpm   ASSESSMENT & PLAN:  1. Acute/ Chronic Systolic HF Diagnosed October 7619. He has not had an ischemic work up. Troponin negative in October. No chest pain. Does not recall virus. Suspect NICM but if he has recurrent chest tightness he will need cath. 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 Echo at that time showed EF 30-35%.  - Plan to repeat ECHO after HF meds optimized. --> End of January.  - Decompensated today and has been off meds >1 year.  -NYHA IV. Marked volume overload. Will need to admit for IV diuresis.  - Start 80 mg IV lasix twice a day and 20 meq K now.l  - Start digoxin 0.125 mg daily.   2. HTN Elevated today   3. PAF In ST Tach  He has been off Xarelto for several months.   -4. ETOH  abuse Continues 1 pint per day.    5. Tobacco Abuse - Encouraged smoking cessation.   Considered Remote Health but tachycardiac and with marked volume overload he will need admit to diurese. Will place PICC to guide diuresis and check CO-OX. May be low output. Once diuresed will need to consider cath. Will also need to watch for ETOH withdrawal.  Admit to 2c versus 2H  Amy Clegg NP-C  10:06 AM   Agree with above.   39 y/o male  with h/o systolic HF due to presumed NICM, severe EOTH use. Has not been seen in almost 2 years. Has not been taking meds.   Presented to HF Clinic today with decompensated HF with low output and fluid overload.  Admitted to 2C in stable condition. Soon after arrival to [facility] patient developed SVT with HRs ~ 190. BP stable but patient became very diaphoretic. Vagal maneuvers failed.   RRT arrived. A second IV was placed. And pads placed. Patient given 6mg  of adenosine and 18mg  of adenosine with little effect.   Anesthesia paged and patient transported to 2H19. Once in 2H we gave another 12 mg of adenosine with no significant effect. Patient then intubated by anesthesia service and emergently cardioverted. SVT broke then went back int SVT soon after. Treated with amio which converted him again.   Patient then developed profound shock physiology with pulmonary edema and hypoxia. Dr. with CCM at bedside. Started on NE and milrinone peripherally.   I placed emergent arterial line and central line. CXR showed pulmonary edema. Bedside echo EF 20% with moderate to severe RV failure.   Patient eventually stabilized on inotropes and with vent support  General:  Intubated sedated HEENT: normal Neck: supple. RIJ swan Carotids 2+ bilat; no bruits. No lymphadenopathy or thryomegaly appreciated. Cor: PMI nondisplaced. Regular tachy + s3 Lungs: + crackles Abdomen: obese soft, nontender, nondistended. No hepatosplenomegaly. No bruits or masses. Good bowel  sounds. Extremities: no cyanosis, clubbing, rash, 1+ edema cool Neuro: intubated sedated  Patient with acute on chronic systolic HF decompensated to cardiogenic shock in setting of SVT. Now s/p DC-CV and intubation. Stabilized on inotropes. Keep in ICU. Diurese. Will eventually need R/L heart cath.   Critical care time 120 mins (not including procedures).   , MD  4:20 PM

## 2020-09-17 NOTE — Addendum Note (Signed)
Encounter addended by: Burna Sis, LCSW on: 09/17/2020 10:08 AM  Actions taken: Flowsheet accepted, Clinical Note Signed

## 2020-09-17 NOTE — CV Procedure (Signed)
Radial arterial line placement.    Emergent consent assumed. The right wrist was prepped and draped in the routine sterile fashion a single lumen radial arterial catheter was placed in the right radial artery using a modified Seldinger technique and u/s guidance. Good blood flow and wave forms. A dressing was placed.     Arvilla Meres, MD  2:55 PM

## 2020-09-17 NOTE — Progress Notes (Signed)
Advanced Heart Failure Clinic Note   Referring Physician: Rudi Coco NP  Primary Care: [name] NP  HF Cardiologist: Dr   HPI: Antonio Tapia is a 39 y.o. male  with a history of ETOH abuse, A flutter, tobacco abuse,  and chronic systolic heart failure.   He was evaluated in [facility] ED on 09/19/18 with palpitations. He had been having palpitations 4-5 days prior to visit.. EKG showed A flutter RVR with rate in the 130s. He spontaneously converted and discharged on lopressor and xarelto. He was referred to A fib clinic and was seen on 09/22/18. At that time he was continued on lopressor. Sleep study was recommended but not completed because he does not have insurance.   He returned to the [facility] ED on 10/04/2018 with chest pain. Troponin was negative. Cardiology was consulted. He has mild volume overload and started on po lasix. On 10/23 he had an ECHO that showed reduced EF 30-35%. He was referred to HF clinic.   Today he returns for HF follow up.He has not been seen in > 1 year. Overall feeling terrible. Says he has never felt this bad before. SOB with exertion and talking. + Orthopnea. Appetite fair. No fever or chills. Weight at home has gone up >20  pounds. Unable to pay for meds. Lost his job.   SH: Smokes 1/2 PPD, drinks at least 1 pint of liquor per day.  Unemployed. Takes the bus to appointments. He has difficulty paying for medications.   FH: no family of CAD or heart failure.   ECHO 10/05/2018  Right ventricular systolic presure 53 mmHg, suggesting at least   moderate pulmonary hypertension. Reduced LV ejection fraction   30-35% with global hypokinesis.  Review of systems complete and found to be negative unless listed in HPI.    Past Medical History:  Diagnosis Date  . Acute congestive heart failure (HCC)   . Alcohol use   . Atrial flutter (HCC)   . Essential hypertension   . Tobacco use     Current Outpatient Medications  Medication Sig Dispense Refill  .  isosorbide-hydrALAZINE (BIDIL) 20-37.5 MG tablet Take 1 tablet by mouth 3 (three) times daily. 90 tablet 6  . Multiple Vitamin (MULTIVITAMIN) capsule Take 1 capsule by mouth daily.    . rivaroxaban (XARELTO) 20 MG TABS tablet Take 1 tablet (20 mg total) by mouth daily with supper. 90 tablet 3  . sacubitril-valsartan (ENTRESTO) 24-26 MG Take 1 tablet by mouth 2 (two) times daily. 60 tablet 1   No current facility-administered medications for this encounter.   No Known Allergies  Social History   Socioeconomic History  . Marital status: Single    Spouse name: Not on file  . Number of children: Not on file  . Years of education: Not on file  . Highest education level: Not on file  Occupational History  . Not on file  Tobacco Use  . Smoking status: Current Every Day Smoker  . Smokeless tobacco: Never Used  Substance and Sexual Activity  . Alcohol use: Yes  . Drug use: No  . Sexual activity: Not on file  Other Topics Concern  . Not on file  Social History Narrative  . Not on file   Social Determinants of Health   Financial Resource Strain:   . Difficulty of Paying Living Expenses: Not on file  Food Insecurity:   . Worried About Programme researcher, broadcasting/film/video in the Last Year: Not on file  . Ran Out of  Food in the Last Year: Not on file  Transportation Needs:   . Lack of Transportation (Medical): Not on file  . Lack of Transportation (Non-Medical): Not on file  Physical Activity:   . Days of Exercise per Week: Not on file  . Minutes of Exercise per Session: Not on file  Stress:   . Feeling of Stress : Not on file  Social Connections:   . Frequency of Communication with Friends and Family: Not on file  . Frequency of Social Gatherings with Friends and Family: Not on file  . Attends Religious Services: Not on file  . Active Member of Clubs or Organizations: Not on file  . Attends Banker Meetings: Not on file  . Marital Status: Not on file  Intimate Partner Violence:     . Fear of Current or Ex-Partner: Not on file  . Emotionally Abused: Not on file  . Physically Abused: Not on file  . Sexually Abused: Not on file    Family History  Problem Relation Age of Onset  . Hypertension Mother   . Hypertension Father   . Diabetes Paternal Grandmother   . Breast cancer Paternal Grandfather    Vitals:   09/17/20 0840  BP: (!) 145/100  Pulse: (!) 117  SpO2: 99%  Weight: 130.6 kg (288 lb)    Wt Readings from Last 3 Encounters:  09/17/20 130.6 kg (288 lb)  12/15/18 118.7 kg (261 lb 9.6 oz)  11/17/18 118.3 kg (260 lb 12.8 oz)     PHYSICAL EXAM: General:  Appears anxious. Dyspneic talking.  HEENT: normal Neck: supple. JVP to jaw. Carotids 2+ bilat; no bruits. No lymphadenopathy or thryomegaly appreciated. Cor: PMI nondisplaced. Tachy/Regular rate & rhythm. No rubs or murmurs. + S3  Lungs: clear Abdomen: soft, nontender, nondistended. No hepatosplenomegaly. No bruits or masses. Good bowel sounds. Extremities: no cyanosis, clubbing, rash, R and LLE 1-2+ edema Neuro: alert & orientedx3, cranial nerves grossly intact. moves all 4 extremities w/o difficulty. Affect pleasant  EKG: ST 110 bpm   ASSESSMENT & PLAN:  1. Acute/ Chronic Systolic HF Diagnosed October 2841. He has not had an ischemic work up. Troponin negative in October. No chest pain. Does not recall virus. Suspect NICM but if he has recurrent chest tightness he will need cath. 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 Echo at that time showed EF 30-35%.  - Plan to repeat ECHO after HF meds optimized. --> End of January.  - Decompensated today and has been off meds >1 year.  -NYHA IV. Marked volume overload. Will need to admit for IV diuresis.  - Start 80 mg IV lasix twice a day and 20 meq K now.l  - Start digoxin 0.125 mg daily.   2. HTN Elevated today   3. PAF In ST Tach  He has been off Xarelto for several months.   -4. ETOH abuse Continues 1 pint per  day.    5. Tobacco Abuse - Encouraged smoking cessation.   Considered Remote Health but tachycardiac and with marked volume overload he will need admit to diurese. Will place PICC to guide diuresis and check CO-OX. May be low output. Once diuresed will need to consider cath. Will also need to watch for ETOH withdrawal.  Admit to 2c versus 2H  Tonye Becket, NP  8:41 AM

## 2020-09-17 NOTE — Progress Notes (Signed)
Report given to [place]. Patient transported to [facility]

## 2020-09-17 NOTE — Addendum Note (Signed)
Encounter addended by: Samara Snide, RN on: 09/17/2020 10:55 AM  Actions taken: Order list changed, Diagnosis association updated, LDA properties accepted, MAR administration accepted, Clinical Note Signed

## 2020-09-17 NOTE — Progress Notes (Signed)
CRITICAL VALUE ALERT  Critical Value:  LA 2.8  Date & Time Notied:  10/5 1430   Provider Notified: Robbie Lis PA-C   Orders Received/Actions taken: No new orders, will trend LA

## 2020-09-17 NOTE — CV Procedure (Signed)
    DIRECT CURRENT CARDIOVERSION  NAME:  Antonio Tapia   MRN: [id number] DOB:  Jul 03, 1981   ADMIT DATE: 09/17/2020   INDICATIONS: SVT with respiratory distress   PROCEDURE:   Informed consent was obtained prior to the procedure. The risks, benefits and alternatives for the procedure were discussed and the patient comprehended these risks. Once an appropriate time out was taken, the patient had the defibrillator pads placed in the anterior and posterior position. The patient then underwent sedation by the anesthesia service. Once an appropriate level of sedation was achieved, the patient received a single biphasic, synchronized 200J shock with prompt conversion to sinus rhythm. No apparent complications.  Arvilla Meres, MD  2:55 PM

## 2020-09-17 NOTE — Anesthesia Procedure Notes (Signed)
Procedure Name: Intubation Date/Time: 09/17/2020 11:45 AM Performed by: Rachel Moulds, CRNA Pre-anesthesia Checklist: Patient identified, Emergency Drugs available, Suction available, Patient being monitored and Timeout performed Patient Re-evaluated:Patient Re-evaluated prior to induction Oxygen Delivery Method: Ambu bag Preoxygenation: Pre-oxygenation with 100% oxygen Induction Type: IV induction, Rapid sequence and Cricoid Pressure applied Grade View: Grade I Tube type: Oral Tube size: 7.5 mm Number of attempts: 1 Airway Equipment and Method: Stylet and Video-laryngoscopy Placement Confirmation: ETT inserted through vocal cords under direct vision,  positive ETCO2,  CO2 detector and breath sounds checked- equal and bilateral Secured at: 23 cm Tube secured with: Tape Dental Injury: Teeth and Oropharynx as per pre-operative assessment

## 2020-09-17 NOTE — Progress Notes (Signed)
  Echocardiogram 2D Echocardiogram has been performed.  Antonio Tapia 09/17/2020, 3:40 PM

## 2020-09-17 NOTE — CV Procedure (Signed)
Central Venous Catheter Insertion Procedure Note Antonio Tapia [id number] 05/22/1981    Procedure: Insertion of Central Venous Catheter Indications: Drug and/or fluid administration   Procedure Details Consent: Risks of procedure as well as the alternatives and risks of each were explained to the (patient/caregiver).  Consent for procedure obtained. Time Out: Verified patient identification, verified procedure, site/side was marked, verified correct patient position, special equipment/implants available, medications/allergies/relevent history reviewed, required imaging and test results available.  Performed   Maximum sterile technique was used including antiseptics, cap, gloves, gown, hand hygiene, mask and sheet. Skin prep: Chlorhexidine; local anesthetic administered A antimicrobial bonded/coated triple lumen catheter was placed in the right internal jugular vein using the Seldinger technique and u/s guidance.   Evaluation Blood flow good Complications: No apparent complications Patient did tolerate procedure well. Chest X-ray ordered to verify placement.  CXR: Normal   Arvilla Meres, MD  2:55 PM

## 2020-09-17 NOTE — Significant Event (Signed)
Rapid Response Event Note   Reason for Call :  HR 180-195 bpm  Initial Focused Assessment:  Pt new admit from the heart failure clinic. Now with HR of 180-195 bpm. Pt denies shortness of breath, dizziness, or lightheadedness. He states he feels his heart race like this sometimes at home, he informs me that he's been having increased occurrences of a "fast heart beat" over the last week. He denies being on a maintenance medication for heart rate/arrythmia. Pt is diaphoretic, denies pain. Lung sounds are clear, diminished. Heart rate appears regular. Rate confirmed with radial pulse and auscultation.   Interventions:  -EKG: SVT - Provided to MD -Adenosine 6mg  -Adenosine 12mg   Pt placed on Zoll, cardiac telemetry and Dr. at bedside for administration of Adenosine.   Plan of Care:  Transfer to ICU for cardioversion  Event Summary:  MD Notified: Dr. Call Time: 1058 Arrival Time: 1100 End Time: 1155  Gala Romney, RN

## 2020-09-18 ENCOUNTER — Inpatient Hospital Stay (HOSPITAL_COMMUNITY): Payer: Self-pay

## 2020-09-18 DIAGNOSIS — F1010 Alcohol abuse, uncomplicated: Secondary | ICD-10-CM

## 2020-09-18 DIAGNOSIS — Z720 Tobacco use: Secondary | ICD-10-CM

## 2020-09-18 LAB — POCT I-STAT 7, (LYTES, BLD GAS, ICA,H+H)
Acid-Base Excess: 0 mmol/L (ref 0.0–2.0)
Bicarbonate: 24.1 mmol/L (ref 20.0–28.0)
Calcium, Ion: 1.1 mmol/L — ABNORMAL LOW (ref 1.15–1.40)
HCT: 37 % — ABNORMAL LOW (ref 39.0–52.0)
Hemoglobin: 12.6 g/dL — ABNORMAL LOW (ref 13.0–17.0)
O2 Saturation: 98 %
Potassium: 3.2 mmol/L — ABNORMAL LOW (ref 3.5–5.1)
Sodium: 140 mmol/L (ref 135–145)
TCO2: 25 mmol/L (ref 22–32)
pCO2 arterial: 36.6 mmHg (ref 32.0–48.0)
pH, Arterial: 7.428 (ref 7.350–7.450)
pO2, Arterial: 110 mmHg — ABNORMAL HIGH (ref 83.0–108.0)

## 2020-09-18 LAB — GLUCOSE, CAPILLARY
Glucose-Capillary: 96 mg/dL (ref 70–99)
Glucose-Capillary: 73 mg/dL (ref 70–99)
Glucose-Capillary: 104 mg/dL — ABNORMAL HIGH (ref 70–99)
Glucose-Capillary: 91 mg/dL (ref 70–99)
Glucose-Capillary: 117 mg/dL — ABNORMAL HIGH (ref 70–99)
Glucose-Capillary: 96 mg/dL (ref 70–99)

## 2020-09-18 LAB — CBC
HCT: 36.2 % — ABNORMAL LOW (ref 39.0–52.0)
Hemoglobin: 12 g/dL — ABNORMAL LOW (ref 13.0–17.0)
MCH: 32.9 pg (ref 26.0–34.0)
MCHC: 33.1 g/dL (ref 30.0–36.0)
MCV: 99.2 fL (ref 80.0–100.0)
Platelets: 210 10*3/uL (ref 150–400)
RBC: 3.65 MIL/uL — ABNORMAL LOW (ref 4.22–5.81)
RDW: 13.3 % (ref 11.5–15.5)
WBC: 5.6 10*3/uL (ref 4.0–10.5)
nRBC: 0 % (ref 0.0–0.2)

## 2020-09-18 LAB — BASIC METABOLIC PANEL
Anion gap: 12 (ref 5–15)
Anion gap: 11 (ref 5–15)
BUN: 22 mg/dL — ABNORMAL HIGH (ref 6–20)
BUN: 23 mg/dL — ABNORMAL HIGH (ref 6–20)
CO2: 23 mmol/L (ref 22–32)
CO2: 22 mmol/L (ref 22–32)
Calcium: 8.3 mg/dL — ABNORMAL LOW (ref 8.9–10.3)
Calcium: 8 mg/dL — ABNORMAL LOW (ref 8.9–10.3)
Chloride: 103 mmol/L (ref 98–111)
Chloride: 103 mmol/L (ref 98–111)
Creatinine, Ser: 2.66 mg/dL — ABNORMAL HIGH (ref 0.61–1.24)
Creatinine, Ser: 2.96 mg/dL — ABNORMAL HIGH (ref 0.61–1.24)
GFR calc non Af Amer: 29 mL/min — ABNORMAL LOW (ref >60)
GFR calc non Af Amer: 25 mL/min — ABNORMAL LOW (ref >60)
Glucose, Bld: 108 mg/dL — ABNORMAL HIGH (ref 70–99)
Glucose, Bld: 126 mg/dL — ABNORMAL HIGH (ref 70–99)
Potassium: 3.1 mmol/L — ABNORMAL LOW (ref 3.5–5.1)
Potassium: 3.8 mmol/L (ref 3.5–5.1)
Sodium: 138 mmol/L (ref 135–145)
Sodium: 136 mmol/L (ref 135–145)

## 2020-09-18 LAB — HEPATIC FUNCTION PANEL
ALT: 59 U/L — ABNORMAL HIGH (ref 0–44)
AST: 73 U/L — ABNORMAL HIGH (ref 15–41)
Albumin: 3 g/dL — ABNORMAL LOW (ref 3.5–5.0)
Alkaline Phosphatase: 46 U/L (ref 38–126)
Bilirubin, Direct: 0.4 mg/dL — ABNORMAL HIGH (ref 0.0–0.2)
Indirect Bilirubin: 0.7 mg/dL (ref 0.3–0.9)
Total Bilirubin: 1.1 mg/dL (ref 0.3–1.2)
Total Protein: 6.5 g/dL (ref 6.5–8.1)

## 2020-09-18 LAB — COOXEMETRY PANEL
Carboxyhemoglobin: 1 % (ref 0.5–1.5)
Methemoglobin: 1 % (ref 0.0–1.5)
O2 Saturation: 74.7 %
Total hemoglobin: 12.5 g/dL (ref 12.0–16.0)

## 2020-09-18 LAB — MAGNESIUM: Magnesium: 1.7 mg/dL (ref 1.7–2.4)

## 2020-09-18 LAB — TRIGLYCERIDES: Triglycerides: 144 mg/dL (ref <150)

## 2020-09-18 MED ORDER — POTASSIUM CHLORIDE 20 MEQ PO PACK
40.0000 meq | PACK | ORAL | Status: AC
  Administered 2020-09-18 (×2): 40 meq
  Filled 2020-09-18 (×2): qty 2

## 2020-09-18 MED ORDER — POTASSIUM CHLORIDE 20 MEQ PO PACK
40.0000 meq | PACK | Freq: Once | ORAL | Status: AC
  Administered 2020-09-18: 40 meq
  Filled 2020-09-18: qty 2

## 2020-09-18 MED ORDER — FUROSEMIDE 10 MG/ML IJ SOLN
8.0000 mg/h | INTRAVENOUS | Status: DC
  Administered 2020-09-18: 8 mg/h via INTRAVENOUS
  Filled 2020-09-18: qty 25

## 2020-09-18 MED ORDER — FUROSEMIDE 10 MG/ML IJ SOLN: 40.0000 mg | Freq: Two times a day (BID) | INTRAMUSCULAR | Status: DC

## 2020-09-18 MED ORDER — ENOXAPARIN SODIUM 60 MG/0.6ML ~~LOC~~ SOLN
60.0000 mg | SUBCUTANEOUS | Status: DC
  Administered 2020-09-18 – 2020-09-24 (×7): 60 mg via SUBCUTANEOUS
  Filled 2020-09-18 (×8): qty 0.6

## 2020-09-18 MED ORDER — VITAL HIGH PROTEIN PO LIQD
1000.0000 mL | ORAL | Status: DC
  Administered 2020-09-18 – 2020-09-19 (×2): 1000 mL

## 2020-09-18 MED ORDER — MAGNESIUM SULFATE 4 GM/100ML IV SOLN
4.0000 g | Freq: Once | INTRAVENOUS | Status: AC
  Administered 2020-09-18: 4 g via INTRAVENOUS
  Filled 2020-09-18: qty 100

## 2020-09-18 MED ORDER — FUROSEMIDE 10 MG/ML IJ SOLN
40.0000 mg | Freq: Once | INTRAMUSCULAR | Status: AC
  Administered 2020-09-18: 40 mg via INTRAVENOUS
  Filled 2020-09-18: qty 4

## 2020-09-18 MED ORDER — PROSOURCE TF PO LIQD
45.0000 mL | Freq: Two times a day (BID) | ORAL | Status: DC
  Administered 2020-09-18 – 2020-09-19 (×2): 45 mL
  Filled 2020-09-18 (×2): qty 45

## 2020-09-18 MED ORDER — POTASSIUM CHLORIDE 10 MEQ/50ML IV SOLN
10.0000 meq | INTRAVENOUS | Status: DC
  Administered 2020-09-18: 10 meq via INTRAVENOUS
  Filled 2020-09-18: qty 50

## 2020-09-18 NOTE — Plan of Care (Signed)
  Problem: Clinical Measurements: Goal: Ability to maintain clinical measurements within normal limits will improve Outcome: Progressing Goal: Will remain free from infection Outcome: Progressing Goal: Diagnostic test results will improve Outcome: Progressing Goal: Respiratory complications will improve Outcome: Progressing Goal: Cardiovascular complication will be avoided Outcome: Progressing   Problem: Elimination: Goal: Will not experience complications related to bowel motility Outcome: Progressing Goal: Will not experience complications related to urinary retention Outcome: Progressing   Problem: Pain Managment: Goal: General experience of comfort will improve Outcome: Progressing   Problem: Safety: Goal: Ability to remain free from injury will improve Outcome: Progressing   Problem: Skin Integrity: Goal: Risk for impaired skin integrity will decrease Outcome: Progressing   Problem: Cardiac: Goal: Ability to achieve and maintain adequate cardiopulmonary perfusion will improve Outcome: Progressing   

## 2020-09-18 NOTE — Progress Notes (Signed)
Wasted 40 cc of Versed with Lorenza Cambridge RN in [place]. Mick Sell, RN 09/18/2020 11:54 AM

## 2020-09-18 NOTE — TOC Initial Note (Signed)
Transition of Care [facility]) - Initial/Assessment Note    Patient Details  Name: Antonio Tapia MRN: [id number] Date of Birth: 1981-09-04  Transition of Care [facility]) CM/SW Contact:    Antonio Lesches, RN Phone Number: [phone] 09/18/2020, 3:40 PM  Clinical Narrative:      Admitted with  Acute/Chronic Systolic HF / SVT. Hx of  ETOH abuse, A flutter, tobacco abuse,and chronic systolic heart failure, non compliance. Resides with girlfriend. Supportive parents ( mom, a retired Aeronautical engineer ). Pt without insurance.... Finacial Counselor referral made per NCM. Active with HF clinic. PCP: Antonio Tapia.  TOC team following for TOC needs.....   Expected Discharge Plan: Home w Home Health Services (SNF) Barriers to Discharge: Continued Medical Work up   Patient Goals and CMS Choice        Expected Discharge Plan and Services Expected Discharge Plan: Home w Home Health Services (SNF)                                              Prior Living Arrangements/Services                       Activities of Daily Living      Permission Sought/Granted                  Emotional Assessment              Admission diagnosis:  Acute on chronic systolic (congestive) heart failure (HCC) [I50.23] Patient Active Problem List   Diagnosis Date Noted  . Acute on chronic systolic (congestive) heart failure (HCC) 09/17/2020  . CHF (congestive heart failure) (HCC) 10/27/2018  . Acute congestive heart failure (HCC)   . Typical atrial flutter (HCC)   . Essential hypertension    PCP:  Antonio Neighbors, FNP Pharmacy:   [facility] [address] [place] - [address] [place] [address] Phone: [phone] Fax: [phone]  [facility] - [place], [place] - [address] [place] [address] Phone: [phone] Fax: [phone]  [facility] -  [place], [place] - [address] [facility] [address] [place] [address] Phone: [phone] Fax: [phone]  [id number] [place], [place] - [address] AT [facility] [address] [place] [address] Phone: [phone] Fax: [phone]  [facility] - [place], [place] - [address] [place] [address] Phone: [phone] Fax: [phone]     Social Determinants of Health (SDOH) Interventions    Readmission Risk Interventions No flowsheet data found.

## 2020-09-18 NOTE — Progress Notes (Signed)
eLink Physician-Brief Progress Note Patient Name: Antonio Tapia DOB: 03-Mar-1981 MRN: [id number]   Date of Service  09/18/2020  HPI/Events of Note  Hypokalemia - K+ = 3.1 and Creatinine = 2.66. Patient being diuresed with Lasix.    eICU Interventions  Will replace K+.     Intervention Category Major Interventions: Electrolyte abnormality - evaluation and management  Fatisha Rabalais [place] 09/18/2020, 6:11 AM

## 2020-09-18 NOTE — Progress Notes (Addendum)
Advanced Heart Failure Rounding Note  PCP-Cardiologist: No primary care provider on file.   Subjective:   Admitted with A/C Systolic HF-->Decompensated d/t SVT.   10/5 Underwent Emergent DC-CV and intubation. Placed on Norepi + milrinone+ amio drip. Diuresed with total of 240 mg  IV lasix. Made 1600 cc urine.   Lab Results  Component Value Date   CREATININE 2.66 (H) 09/18/2020   CREATININE 1.90 (H) 09/17/2020   CREATININE 1.43 (H) 09/17/2020   Sedated on vent. FiO2 40%  Currently on Norepi 8 mcg + milrinone 0.25 mcg + amio 30 mg per hour.  On IV lasix. CVP 14-15 (down from 25)   Objective:   Weight Range: 126.9 kg Body mass index is 37.94 kg/m.   Vital Signs:   Temp:  [98.1 F (36.7 C)-100 F (37.8 C)] 100 F (37.8 C) (10/06 0400) Pulse Rate:  [51-198] 90 (10/06 0700) Resp:  [0-32] 18 (10/06 0700) BP: (82-240)/(45-183) 94/53 (10/06 0700) SpO2:  [66 %-100 %] 100 % (10/06 0700) Arterial Line BP: (55-145)/(39-84) 115/55 (10/06 0700) FiO2 (%):  [40 %-100 %] 40 % (10/06 0400) Weight:  [126.9 kg] 126.9 kg (10/06 0645) Last BM Date:  (PTA)  Weight change: Filed Weights   09/18/20 0645  Weight: 126.9 kg    Intake/Output:   Intake/Output Summary (Last 24 hours) at 09/18/2020 0712 Last data filed at 09/18/2020 0700 Gross per 24 hour  Intake 1655.36 ml  Output 1700 ml  Net -44.64 ml      Physical Exam   CVP14-15 General:  Intubated No resp difficulty HEENT: ETT Neck: Supple. JVP to jaw Carotids 2+ bilat; no bruits. No lymphadenopathy or thyromegaly appreciated. RIJ Cor: PMI nondisplaced. Regular rate & rhythm. No rubs, or murmurs. +S3 Lungs: Clear Abdomen: Soft, nontender, distended. No hepatosplenomegaly. No bruits or masses. Good bowel sounds. Extremities: No cyanosis, clubbing, rash, Trace lower extremity edema Neuro: sedated on vent GU: Foley dark yellow urine    Telemetry   SR 90s   EKG    n/a  Labs    CBC Recent Labs    09/17/20 1310  09/17/20 1310 09/17/20 1403 09/18/20 0424  WBC 5.9  --   --  5.6  HGB 12.8*   < > 13.6 12.0*  HCT 39.5   < > 40.0 36.2*  MCV 100.3*  --   --  99.2  PLT 275  --   --  210   < > = values in this interval not displayed.   Basic Metabolic Panel Recent Labs    09/17/20 1310 09/17/20 1310 09/17/20 1403 09/17/20 1503 09/18/20 0424  NA 138   < > 140  --  138  K 3.8   < > 3.7  --  3.1*  CL 104  --   --   --  103  CO2 19*  --   --   --  23  GLUCOSE 182*  --   --   --  108*  BUN 19  --   --   --  22*  CREATININE 1.90*  --   --   --  2.66*  CALCIUM 8.4*  --   --   --  8.3*  MG  --   --   --  1.8 1.7   < > = values in this interval not displayed.   Liver Function Tests Recent Labs    09/17/20 0904 09/17/20 1310  AST 71* 77*  ALT 72* 71*  ALKPHOS 56 57  BILITOT 1.2 1.4*  PROT 7.4 7.1  ALBUMIN 3.4* 3.3*   No results for input(s): LIPASE, AMYLASE in the last 72 hours. Cardiac Enzymes No results for input(s): CKTOTAL, CKMB, CKMBINDEX, TROPONINI in the last 72 hours.  BNP: BNP (last 3 results) Recent Labs    09/17/20 0836  BNP 1,001.0*    ProBNP (last 3 results) No results for input(s): PROBNP in the last 8760 hours.   D-Dimer No results for input(s): DDIMER in the last 72 hours. Hemoglobin A1C No results for input(s): HGBA1C in the last 72 hours. Fasting Lipid Panel Recent Labs    09/18/20 0424  TRIG 144   Thyroid Function Tests Recent Labs    09/17/20 0904  TSH 4.471    Other results:   Imaging    DG CHEST PORT 1 VIEW  Result Date: 09/17/2020 CLINICAL DATA:  Hypoxia EXAM: PORTABLE CHEST 1 VIEW COMPARISON:  October 04, 2018 FINDINGS: The cardiomediastinal silhouette is unchanged and enlarged in contour.ETT tip terminates 3.9 cm above the carina. A RIGHT IJ CVC tip terminates over the superior cavoatrial junction. The enteric tube courses through the chest to the abdomen beyond the field-of-view. Small RIGHT pleural effusion. No pneumothorax. Perihilar  vascular prominence with diffuse interstitial markings. LEFT basilar heterogeneous opacity, likely atelectasis. Visualized abdomen is unremarkable. No acute osseous abnormality. IMPRESSION: 1.  Support apparatus as described above. 2. Constellation of findings are favored to reflect pulmonary edema with superimposed atelectasis. Differential considerations include infection. Electronically Signed   By: Valentino Saxon MD   On: 09/17/2020 13:01   ECHOCARDIOGRAM COMPLETE  Result Date: 09/17/2020    ECHOCARDIOGRAM REPORT   Patient Name:   Antonio Tapia Date of Exam: 09/17/2020 Medical Rec #:  [id number]   Height:       72.0 in Accession #:    [id number]  Weight:       288.0 lb Date of Birth:  09-21-1981   BSA:          2.488 m Patient Age:    39 years    BP:           114/75 mmHg Patient Gender: M           HR:           90 bpm. Exam Location:  Inpatient Procedure: 2D Echo, Cardiac Doppler and Color Doppler Indications:    Congestive Heart Failure 428.0 / I50.9  History:        Patient has prior history of Echocardiogram examinations, most                 recent 10/05/2018. CHF, Arrythmias:Atrial Flutter; Risk                 Factors:Hypertension and Current Smoker.  Sonographer:    Vickie Epley RDCS Referring Phys: [id number] AMY D CLEGG  Sonographer Comments: Echo performed with patient supine and on artificial respirator. IMPRESSIONS  1. Left ventricular ejection fraction, by estimation, is 40 to 45%. The left ventricle has mildly decreased function. The left ventricle demonstrates global hypokinesis. There is mild concentric left ventricular hypertrophy. Left ventricular diastolic parameters are consistent with Grade II diastolic dysfunction (pseudonormalization).  2. Right ventricular systolic function is normal. The right ventricular size is normal.  3. Left atrial size was moderately dilated.  4. Right atrial size was moderately dilated.  5. The mitral valve is normal in structure. Mild mitral valve regurgitation.  No evidence of mitral stenosis.  6. The aortic valve is tricuspid.  Aortic valve regurgitation is not visualized. No aortic stenosis is present.  7. The inferior vena cava is dilated in size with <50% respiratory variability, suggesting right atrial pressure of 15 mmHg. FINDINGS  Left Ventricle: Left ventricular ejection fraction, by estimation, is 40 to 45%. The left ventricle has mildly decreased function. The left ventricle demonstrates global hypokinesis. The left ventricular internal cavity size was normal in size. There is  mild concentric left ventricular hypertrophy. Left ventricular diastolic parameters are consistent with Grade II diastolic dysfunction (pseudonormalization). Indeterminate filling pressures. Right Ventricle: The right ventricular size is normal. No increase in right ventricular wall thickness. Right ventricular systolic function is normal. Left Atrium: Left atrial size was moderately dilated. Right Atrium: Right atrial size was moderately dilated. Pericardium: There is no evidence of pericardial effusion. Mitral Valve: The mitral valve is normal in structure. Mild mitral valve regurgitation, with posteriorly-directed jet. No evidence of mitral valve stenosis. Tricuspid Valve: The tricuspid valve is normal in structure. Tricuspid valve regurgitation is not demonstrated. No evidence of tricuspid stenosis. Aortic Valve: The aortic valve is tricuspid. Aortic valve regurgitation is not visualized. No aortic stenosis is present. Pulmonic Valve: The pulmonic valve was normal in structure. Pulmonic valve regurgitation is trivial. No evidence of pulmonic stenosis. Aorta: The aortic root is normal in size and structure. Venous: The inferior vena cava is dilated in size with less than 50% respiratory variability, suggesting right atrial pressure of 15 mmHg. IAS/Shunts: No atrial level shunt detected by color flow Doppler.  LEFT VENTRICLE PLAX 2D LVIDd:         5.20 cm      Diastology LVIDs:          4.40 cm      LV e' medial:    6.68 cm/s LV PW:         1.10 cm      LV E/e' medial:  9.9 LV IVS:        1.10 cm      LV e' lateral:   8.10 cm/s LVOT diam:     2.30 cm      LV E/e' lateral: 8.1 LV SV:         36 LV SV Index:   14 LVOT Area:     4.15 cm  LV Volumes (MOD) LV vol d, MOD A2C: 181.0 ml LV vol d, MOD A4C: 139.0 ml LV vol s, MOD A2C: 108.0 ml LV vol s, MOD A4C: 79.4 ml LV SV MOD A2C:     73.0 ml LV SV MOD A4C:     139.0 ml LV SV MOD BP:      68.4 ml RIGHT VENTRICLE RV S prime:     8.18 cm/s TAPSE (M-mode): 1.6 cm LEFT ATRIUM             Index       RIGHT ATRIUM           Index LA diam:        4.50 cm 1.81 cm/m  RA Area:     26.50 cm LA Vol (A2C):   88.4 ml 35.53 ml/m RA Volume:   89.80 ml  36.10 ml/m LA Vol (A4C):   74.0 ml 29.75 ml/m LA Biplane Vol: 82.5 ml 33.16 ml/m  AORTIC VALVE LVOT Vmax:   56.40 cm/s LVOT Vmean:  38.500 cm/s LVOT VTI:    0.086 m  AORTA Ao Root diam: 3.50 cm MITRAL VALVE MV Area (PHT): 5.02 cm    SHUNTS MV  Decel Time: 151 msec    Systemic VTI:  0.09 m MR Peak grad: 71.9 mmHg    Systemic Diam: 2.30 cm MR Vmax:      424.00 cm/s MV E velocity: 65.80 cm/s MV A velocity: 30.20 cm/s MV E/A ratio:  2.18 Skeet Latch MD Electronically signed by Skeet Latch MD Signature Date/Time: 09/17/2020/3:53:10 PM    Final    [place] EKG SITE RITE  Result Date: 09/17/2020 If Site Rite image not attached, placement could not be confirmed due to current cardiac rhythm.     Medications:     Scheduled Medications: . chlorhexidine gluconate (MEDLINE KIT)  15 mL Mouth Rinse BID  . Chlorhexidine Gluconate Cloth  6 each Topical Daily  . docusate  100 mg Per Tube BID  . enoxaparin (LOVENOX) injection  40 mg Subcutaneous Q24H  . folic acid  1 mg Per Tube Daily  . furosemide  80 mg Intravenous BID  . insulin aspart  2-6 Units Subcutaneous Q4H  . ipratropium-albuterol  3 mL Nebulization Q6H  . mouth rinse  15 mL Mouth Rinse 10 times per day  . multivitamin  15 mL Per Tube Daily  .  pantoprazole (PROTONIX) IV  40 mg Intravenous Daily  . polyethylene glycol  17 g Per Tube Daily  . sodium chloride flush  10-40 mL Intracatheter Q12H  . sodium chloride flush  3 mL Intravenous Q12H  . thiamine  100 mg Per Tube Daily     Infusions: . sodium chloride    . sodium chloride Stopped (09/17/20 1442)  . amiodarone 30 mg/hr (09/18/20 0700)  . fentaNYL infusion INTRAVENOUS 175 mcg/hr (09/18/20 0700)  . midazolam 3 mg/hr (09/18/20 0700)  . milrinone 0.25 mcg/kg/min (09/18/20 0700)  . norepinephrine (LEVOPHED) Adult infusion 9 mcg/min (09/18/20 0700)  . potassium chloride 50 mL/hr at 09/18/20 0700  . propofol (DIPRIVAN) infusion 25 mcg/kg/min (09/18/20 0700)     PRN Medications:  sodium chloride, acetaminophen, fentaNYL, midazolam, ondansetron (ZOFRAN) IV, sodium chloride flush, sodium chloride flush     Assessment/Plan   1. A/C Systolic HF -->Cardiogenic Shock  - ECHO EF 20% with moderate RV failure. - CO-OX 75%. On norepi 9 mcg + milrinone 0.25 mcg. Try and avoid hypotension.   - CVP 14-15. Give 40 mg IV lasix now and start lasix drip at 8 mg per hour.  - Receiving K runs and down tube. Check BMET 1300 - No BB with shock - No spiro/arin with AKI Once stabilized he will need cath to further evaluate   2. Acute Respiratory Failure Intubated 10/5. CCM following.   3. PAF Has history of A flutter back in 2019. Off xarelto due cost for 1 year.  If he goes in A flutter will add anticoagulation.   4. SVT Emergent Cardioversion 10/5 - On amio drip 30 mg per hour.  5. AKI - Worsening renal function in the setting of shock and ATN - Creatinine 1.5>1.9>2.6 .Creatinine worsening today. Watch closely.   6.  ETOH  - Watch for withdrawal.  - Place on CIWA  7.  Tobacco Abuse    Length of Stay: 1  Amy Clegg, NP  09/18/2020, 7:12 AM  Advanced Heart Failure Team Pager [phone] (M-F; 7a - 4p)  Please contact [facility] for night-coverage after hours (4p -7a  ) and weekends on [email or web address]  Agree with above.   Remains intubated/sedated On FiO2 40%.   Rhythm stable on IV amio. Co-ox improved on NE and milrinone. Creatinine climbing due to  ATN.   General:  Intubated/sedated HEENT: normal Neck: supple. RIJ central line  Carotids 2+ bilat; no bruits. No lymphadenopathy or thryomegaly appreciated. Cor: PMI nondisplaced. Regular rate & rhythm. +s3 Lungs: clear Abdomen: obese soft, nontender, nondistended. No hepatosplenomegaly. No bruits or masses. Good bowel sounds. Extremities: no cyanosis, clubbing, rash, edema Neuro: intubated/sedated  He remains very tenuous in setting of SVT on top of recurrent cardiomyopathy. Rhythm now NSR on amio. CVP remains high. Continue inotropic support and amio. Agree with IV lasix. Supp lytes. Check LFTs.   Wean vent as tolerated. Appreciate CCM care.   Glori Bickers, MD  8:08 AM

## 2020-09-18 NOTE — Progress Notes (Signed)
Initial Nutrition Assessment  DOCUMENTATION CODES:   Obesity unspecified  INTERVENTION:   If unable to extubate within the next 24 hours, recommend begin TF via OG tube: Vital High Protein at 50 ml/h (1200 ml per day) Prosource TF 90 ml TID Provides 1440 kcal (1957 kcal total with propofol), 171 gm protein, 1003 ml free water daily   NUTRITION DIAGNOSIS:   Inadequate oral intake related to inability to eat as evidenced by NPO status.  GOAL:   Provide needs based on ASPEN/SCCM guidelines  MONITOR:   Vent status, Labs, I & O's  REASON FOR ASSESSMENT:   Ventilator    ASSESSMENT:   39 yo male admitted 10/5 from HF clinic with acute decompensated HF. Required emergent cardioversion and intubation after admission. PMH includes ETOH abuse, a flutter, tobacco abuse, chronic HF, NICM.   At HF clinic, weight was up by >20 lbs. Diuresing with Lasix. OG tube in place.  Patient is currently intubated on ventilator support MV: 9.6 L/min Temp (24hrs), Avg:99.6 F (37.6 C), Min:98.1 F (36.7 C), Max:100.6 F (38.1 C)  Propofol: 19.6 ml/hr providing 517 kcal from lipid.  Labs reviewed. K 3.2-->3.8 after IV repletion CBG: 96-73-104  Medications reviewed and include KCl, mag sulfate, levophed, propofol, IV lasix, colace, folic acid, novolog, MVI, miralax, thiamine.   Unsure of usual weight. No recent weight measurements available PTA. Patient with mild generalized edema per RN flow sheet.  UOP 1600 ml x 24 hours I/O +102.9 ml since admission  NUTRITION - FOCUSED PHYSICAL EXAM:  unable to complete  Diet Order:   Diet Order            Diet NPO time specified  Diet effective now                 EDUCATION NEEDS:   Not appropriate for education at this time  Skin:  Skin Assessment: Reviewed RN Assessment  Last BM:  no BM documented  Height:   Ht Readings from Last 1 Encounters:  09/17/20 6' (1.829 m)    Weight:   Wt Readings from Last 1 Encounters:   09/18/20 126.9 kg    Ideal Body Weight:  80.9 kg  BMI:  Body mass index is 37.94 kg/m.  Estimated Nutritional Needs:   Kcal:  1400-1775  Protein:  160-180 gm  Fluid:  2 L    Gabriel Rainwater, RD, LDN, CNSC Please refer to Amion for contact information.

## 2020-09-18 NOTE — Consult Note (Signed)
NAME:  Antonio Tapia, MRN:  [id number], DOB:  [birth year], LOS: 1 ADMISSION DATE:  09/17/2020, CONSULTATION DATE:  10/05 REFERRING MD:  Dr Gala Romney CHIEF COMPLAINT:  SVT   Brief History    Antonio Tapia is a 39 yr old male with PMH EOTH abuse, A flutter, tobacco abuse, chronic systolic heart failure admitted on 10/5 for SVT. Now in cardiogenic shock.  History of present illness    Mr. Antonio Tapia is a 39 y/o gentleman with a history of cardiomyopathy (unknown if ischemic), permanent Afib not on AC, and chronic HFrEF who has been off GDMT x 1 year and presented to outpatient cardiology clinic today with acute decompensated heart failure.  He was direct admitted to the hospital, subsequently developed SVT with HR as high as 190 who required emergent DCCV. He was intubated for acute respiratory failure due to cardiogenic pulmonary edema and need for DCCV. He has been started on inotropes to assist with diuresis.   Past Medical History   EOTH abuse A- flutter Tobacco abuse HFrEF HTN   Significant Hospital Events   10/5: Admitted, cardioversion, intubated  Consults:  HF   PCCM  Procedures:  10/05 Intubation, CVC insertion, arterial line   Significant Diagnostic Tests:   Echo in 2019: EF 30-35%, systolic functionmoderately to severely reduced, Diffuse hypokinesis. Right ventricular systolic presure 53 mmHg, suggesting at least moderate pulmonary hypertension.  Echo pending for 10/5: LVEF 40-45% with global hypokinesis, G2DD  CXR: ETT tip terminates 3.9 cm above the carina. A RIGHT IJ CVC tip terminates over the superior cavoatrial junction. Right sided pleural effusion. Infection Vs pulmonary edema     Micro Data:  COVID neg   Antimicrobials:    Interim history/subjective:  Remains intubated, no acute events overnight.   Objective   Blood pressure 99/61, pulse (!) 106, temperature 98.9 F (37.2 C), temperature source Oral, resp. rate 17, height 6' (1.829 m), weight 126.9 kg, SpO2  96 %. CVP:  [9 mmHg-25 mmHg] 15 mmHg  Vent Mode: PRVC FiO2 (%):  [35 %-40 %] 35 % Set Rate:  [16 bmp-18 bmp] 16 bmp Vt Set:  [620 mL] 620 mL PEEP:  [5 cmH20-10 cmH20] 5 cmH20 Plateau Pressure:  [21 cmH20-33 cmH20] 23 cmH20   Intake/Output Summary (Last 24 hours) at 09/18/2020 1845 Last data filed at 09/18/2020 1800 Gross per 24 hour  Intake 2080.72 ml  Output 2090 ml  Net -9.28 ml   Filed Weights   09/18/20 0645  Weight: 126.9 kg    Examination: General: Critically ill-appearing young man intubated, sedated HEENT: [place]/AT, ETT in place Lungs: Clear to auscultation bilaterally.  No significant tracheal secretions.  No longer obstructed on mechanical ventilation. Cardiovascular: S1S2, RRR  Abdomen: soft, NT Extremities: ongoing pitting edema, no cyanosis or clubbing. Neuro: PERRL, RASS -5  Skin: warm & dry  Resolved Hospital Problem list   N/a  Assessment & Plan:  Acute hypoxic respiratory failure due to cardiogenic pulmonary edema -Continue low tidal volume ventilation, 4-8 cc/kg ideal body weight with goal plateau less than 30 and driving pressure less than 15. -VAP prevention protocol -Daily SAT and SBT as appropriate.  Needs to diurese more prior to extubation. -Propofol or Versed are preferred sedatives as history of alcohol abuse.   Cardiogenic shock due to CM; acute on chronic HFrEF SVT, s/p DCCV -Continue amiodarone -Continue milrinone 0.25 -Continue norepinephrine to maintain MAP greater than 65. -Strict I/O -Lasix drip. Continue to closely monitor renal function and electrolytes. -serial co-ox -Unable to tolerate  guideline directed heart failure therapy due to shock -Eventually planning for ischemic work-up when stable  AKI; cardiorenal syndrome -Continue to monitor renal function -Strict I/O -Renally dose meds and avoid nephrotoxic meds  Elevated transaminases due to congestive hepatopathy, possibly due to ETOH use -Continue to  monitor  Hypokalemia Hypomagnesemia  -Continue to monitor -Replete PRN  Acute anemia likely due to critical illness -Transfuse for hemoglobin less than 7-8 or hemodynamically significant bleeding. -Continue to monitor  Hx of EOTH abuse -Supplemental vitamins -Start trickle tube feeds given risk for malnutrition -Sedation ideally with midazolam or propofol to prevent withdrawal -May need Librium taper post extubation   Tobacco abuse -Nicotine patch -Encourage smoking cessation when appropriate  Best practice:   Diet: NPO  Pain/Anxiety/Delirium protocol (if indicated): Fentanyl, propofol, versed   VAP protocol (if indicated): yes DVT prophylaxis: Lovenox  GI prophylaxis: Protonix  Glucose control:  Mobility: bed rest Code Status: FULL  Family Communication: father updated at bedside Disposition: ICU   Labs   CBC: Recent Labs  Lab 09/17/20 0904 09/17/20 0904 09/17/20 1212 09/17/20 1310 09/17/20 1403 09/18/20 0424 09/18/20 0753  WBC 5.0  --   --  5.9  --  5.6  --   HGB 13.3   < > 15.6 12.8* 13.6 12.0* 12.6*  HCT 40.5   < > 46.0 39.5 40.0 36.2* 37.0*  MCV 102.0*  --   --  100.3*  --  99.2  --   PLT 247  --   --  275  --  210  --    < > = values in this interval not displayed.    Basic Metabolic Panel: Recent Labs  Lab 09/17/20 0904 09/17/20 1212 09/17/20 1310 09/17/20 1403 09/17/20 1503 09/18/20 0424 09/18/20 0753 09/18/20 1251  NA 138   < > 138 140  --  138 140 136  K 3.7   < > 3.8 3.7  --  3.1* 3.2* 3.8  CL 103  --  104  --   --  103  --  103  CO2 25  --  19*  --   --  23  --  22  GLUCOSE 113*  --  182*  --   --  108*  --  126*  BUN 17  --  19  --   --  22*  --  23*  CREATININE 1.43*  --  1.90*  --   --  2.66*  --  2.96*  CALCIUM 8.9  --  8.4*  --   --  8.3*  --  8.0*  MG  --   --   --   --  1.8 1.7  --   --    < > = values in this interval not displayed.   GFR: Estimated Creatinine Clearance: 46.1 mL/min (A) (by C-G formula based on SCr of  2.96 mg/dL (H)). Recent Labs  Lab 09/17/20 0904 09/17/20 1310 09/17/20 1503 09/18/20 0424  WBC 5.0 5.9  --  5.6  LATICACIDVEN  --  2.8* 1.8  --     Liver Function Tests: Recent Labs  Lab 09/17/20 0904 09/17/20 1310 09/18/20 1230  AST 71* 77* 73*  ALT 72* 71* 59*  ALKPHOS 56 57 46  BILITOT 1.2 1.4* 1.1  PROT 7.4 7.1 6.5  ALBUMIN 3.4* 3.3* 3.0*   No results for input(s): LIPASE, AMYLASE in the last 168 hours. No results for input(s): AMMONIA in the last 168 hours.  ABG    Component  Value Date/Time   PHART 7.428 09/18/2020 0753   PCO2ART 36.6 09/18/2020 0753   PO2ART 110 (H) 09/18/2020 0753   HCO3 24.1 09/18/2020 0753   TCO2 25 09/18/2020 0753   ACIDBASEDEF 1.0 09/17/2020 1403   O2SAT 98.0 09/18/2020 0753     Coagulation Profile: No results for input(s): INR, PROTIME in the last 168 hours.  Cardiac Enzymes: No results for input(s): CKTOTAL, CKMB, CKMBINDEX, TROPONINI in the last 168 hours.  HbA1C: Hgb A1c MFr Bld  Date/Time Value Ref Range Status  10/12/2018 03:35 PM 5.8 (H) 4.8 - 5.6 % Final    Comment:             Prediabetes: 5.7 - 6.4          Diabetes: >6.4          Glycemic control for adults with diabetes: <7.0     CBG: Recent Labs  Lab 09/17/20 2357 09/18/20 0431 09/18/20 0813 09/18/20 1113 09/18/20 1532  GLUCAP 99 96 73 104* 91     This patient is critically ill with multiple organ system failure which requires frequent high complexity decision making, assessment, support, evaluation, and titration of therapies. This was completed through the application of advanced monitoring technologies and extensive interpretation of multiple databases. During this encounter critical care time was devoted to patient care services described in this note for 42 minutes.  Steffanie Dunn, DO 09/18/20 7:11 PM [facility]]

## 2020-09-19 DIAGNOSIS — A419 Sepsis, unspecified organism: Secondary | ICD-10-CM

## 2020-09-19 DIAGNOSIS — J9601 Acute respiratory failure with hypoxia: Secondary | ICD-10-CM

## 2020-09-19 DIAGNOSIS — R6521 Severe sepsis with septic shock: Secondary | ICD-10-CM

## 2020-09-19 DIAGNOSIS — J189 Pneumonia, unspecified organism: Secondary | ICD-10-CM

## 2020-09-19 LAB — COOXEMETRY PANEL
Carboxyhemoglobin: 1.3 % (ref 0.5–1.5)
Methemoglobin: 1.1 % (ref 0.0–1.5)
O2 Saturation: 81.2 %
Total hemoglobin: 12.6 g/dL (ref 12.0–16.0)

## 2020-09-19 LAB — BASIC METABOLIC PANEL
Anion gap: 12 (ref 5–15)
Anion gap: 10 (ref 5–15)
BUN: 27 mg/dL — ABNORMAL HIGH (ref 6–20)
BUN: 26 mg/dL — ABNORMAL HIGH (ref 6–20)
CO2: 22 mmol/L (ref 22–32)
CO2: 23 mmol/L (ref 22–32)
Calcium: 7.9 mg/dL — ABNORMAL LOW (ref 8.9–10.3)
Calcium: 7.5 mg/dL — ABNORMAL LOW (ref 8.9–10.3)
Chloride: 104 mmol/L (ref 98–111)
Chloride: 105 mmol/L (ref 98–111)
Creatinine, Ser: 3.36 mg/dL — ABNORMAL HIGH (ref 0.61–1.24)
Creatinine, Ser: 2.66 mg/dL — ABNORMAL HIGH (ref 0.61–1.24)
GFR calc non Af Amer: 22 mL/min — ABNORMAL LOW (ref >60)
GFR calc non Af Amer: 29 mL/min — ABNORMAL LOW (ref >60)
Glucose, Bld: 135 mg/dL — ABNORMAL HIGH (ref 70–99)
Glucose, Bld: 135 mg/dL — ABNORMAL HIGH (ref 70–99)
Potassium: 4.3 mmol/L (ref 3.5–5.1)
Potassium: 3.9 mmol/L (ref 3.5–5.1)
Sodium: 138 mmol/L (ref 135–145)
Sodium: 138 mmol/L (ref 135–145)

## 2020-09-19 LAB — MAGNESIUM: Magnesium: 2.2 mg/dL (ref 1.7–2.4)

## 2020-09-19 LAB — CBC
HCT: 38 % — ABNORMAL LOW (ref 39.0–52.0)
Hemoglobin: 12.5 g/dL — ABNORMAL LOW (ref 13.0–17.0)
MCH: 33.6 pg (ref 26.0–34.0)
MCHC: 32.9 g/dL (ref 30.0–36.0)
MCV: 102.2 fL — ABNORMAL HIGH (ref 80.0–100.0)
Platelets: 230 10*3/uL (ref 150–400)
RBC: 3.72 MIL/uL — ABNORMAL LOW (ref 4.22–5.81)
RDW: 13.3 % (ref 11.5–15.5)
WBC: 10.8 10*3/uL — ABNORMAL HIGH (ref 4.0–10.5)
nRBC: 0 % (ref 0.0–0.2)

## 2020-09-19 LAB — CALCIUM, IONIZED: Calcium, Ionized, Serum: 4.6 mg/dL (ref 4.5–5.6)

## 2020-09-19 LAB — PHOSPHORUS: Phosphorus: 5.7 mg/dL — ABNORMAL HIGH (ref 2.5–4.6)

## 2020-09-19 LAB — PROCALCITONIN: Procalcitonin: 1.98 ng/mL

## 2020-09-19 LAB — TRIGLYCERIDES: Triglycerides: 161 mg/dL — ABNORMAL HIGH (ref <150)

## 2020-09-19 LAB — GLUCOSE, CAPILLARY
Glucose-Capillary: 105 mg/dL — ABNORMAL HIGH (ref 70–99)
Glucose-Capillary: 143 mg/dL — ABNORMAL HIGH (ref 70–99)
Glucose-Capillary: 147 mg/dL — ABNORMAL HIGH (ref 70–99)
Glucose-Capillary: 126 mg/dL — ABNORMAL HIGH (ref 70–99)

## 2020-09-19 MED ORDER — PROSOURCE TF PO LIQD
90.0000 mL | Freq: Three times a day (TID) | ORAL | Status: DC
  Administered 2020-09-19 – 2020-09-24 (×15): 90 mL
  Filled 2020-09-19 (×16): qty 90

## 2020-09-19 MED ORDER — PIPERACILLIN-TAZOBACTAM 3.375 G IVPB
3.3750 g | Freq: Three times a day (TID) | INTRAVENOUS | Status: DC
  Administered 2020-09-19 – 2020-09-21 (×7): 3.375 g via INTRAVENOUS
  Filled 2020-09-19 (×6): qty 50

## 2020-09-19 MED ORDER — VANCOMYCIN VARIABLE DOSE PER UNSTABLE RENAL FUNCTION (PHARMACIST DOSING): Status: DC

## 2020-09-19 MED ORDER — INFLUENZA VAC SPLIT QUAD 0.5 ML IM SUSY
0.5000 mL | PREFILLED_SYRINGE | INTRAMUSCULAR | Status: DC
  Filled 2020-09-19: qty 0.5

## 2020-09-19 MED ORDER — VASOPRESSIN 20 UNITS/100 ML INFUSION FOR SHOCK
0.0000 [IU]/min | INTRAVENOUS | Status: DC
  Administered 2020-09-19 – 2020-09-23 (×9): 0.03 [IU]/min via INTRAVENOUS
  Filled 2020-09-19 (×9): qty 100

## 2020-09-19 MED ORDER — VITAL HIGH PROTEIN PO LIQD
1000.0000 mL | ORAL | Status: DC
  Administered 2020-09-19 – 2020-09-23 (×5): 1000 mL

## 2020-09-19 MED ORDER — VANCOMYCIN HCL 10 G IV SOLR
2500.0000 mg | Freq: Once | INTRAVENOUS | Status: AC
  Administered 2020-09-19: 2500 mg via INTRAVENOUS
  Filled 2020-09-19: qty 2500

## 2020-09-19 MED ORDER — PNEUMOCOCCAL VAC POLYVALENT 25 MCG/0.5ML IJ INJ: 0.5000 mL | INJECTION | INTRAMUSCULAR | Status: DC

## 2020-09-19 NOTE — Progress Notes (Addendum)
Advanced Heart Failure Rounding Note  PCP-Cardiologist: No primary care provider on file.   Subjective:   Admitted with A/C Systolic HF-->Decompensated d/t SVT.   10/5 Underwent Emergent DC-CV and intubation. Placed on Norepi + milrinone+ amio drip.   Remains in NSR/ ST 90s-low 100s. No further arrhythmias.   Increased pressor requirement past 24 hr. NE now up to 22. Continues on milrinone 0.25.  Co-ox 81%. Febrile overnight. mTemp 102. WBC 5>>10K. CXR shows ? RLL infiltrate.      Scr continues to rise, 1.4>>1.9>>2.7>>3.0>>3.4. -1.7L in UOP yesterday, I/Os net +424cc. CVP 13.   Remains sedated on vent.     Objective:   Weight Range: 124.9 kg Body mass index is 37.34 kg/m.   Vital Signs:   Temp:  [98.9 F (37.2 C)-102.3 F (39.1 C)] 99.5 F (37.5 C) (10/07 0350) Pulse Rate:  [89-117] 102 (10/07 0645) Resp:  [8-24] 16 (10/07 0645) BP: (76-122)/(44-73) 117/56 (10/07 0600) SpO2:  [82 %-100 %] 99 % (10/07 0645) Arterial Line BP: (81-177)/(45-78) 107/60 (10/07 0645) FiO2 (%):  [35 %-40 %] 35 % (10/07 0400) Weight:  [124.9 kg] 124.9 kg (10/07 0500) Last BM Date:  (PTA)  Weight change: Filed Weights   09/18/20 0645 09/19/20 0500  Weight: 126.9 kg 124.9 kg    Intake/Output:   Intake/Output Summary (Last 24 hours) at 09/19/2020 0715 Last data filed at 09/19/2020 0645 Gross per 24 hour  Intake 2163.6 ml  Output 1740 ml  Net 423.6 ml      Physical Exam   CVP 13 General:  Young moderately obese male, intubated and sedated HEENT: + ETT Neck: Supple. JVP elevated to jaw. Carotids 2+ bilat; no bruits. No lymphadenopathy or thyromegaly appreciated. RIJ Cor: PMI nondisplaced. Regular rate & rhythm. No rubs, or murmurs.  Lungs: intubated and clear  Abdomen: Soft, nontender, distended. No hepatosplenomegaly. No bruits or masses. Good bowel sounds. Extremities: No cyanosis, clubbing, or rash. Trace bilateral LEE. Warm distal extremities.  Neuro: intubated and sedated     Telemetry   NSR/ Sinus tach, 90s-low 100s. No further SVT   EKG    No new EKG to review   Labs    CBC Recent Labs    09/18/20 0424 09/18/20 0424 09/18/20 0753 09/19/20 0418  WBC 5.6  --   --  10.8*  HGB 12.0*   < > 12.6* 12.5*  HCT 36.2*   < > 37.0* 38.0*  MCV 99.2  --   --  102.2*  PLT 210  --   --  230   < > = values in this interval not displayed.   Basic Metabolic Panel Recent Labs    09/18/20 0424 09/18/20 0753 09/18/20 1251 09/19/20 0418  NA 138   < > 136 138  K 3.1*   < > 3.8 4.3  CL 103   < > 103 104  CO2 23   < > 22 22  GLUCOSE 108*   < > 126* 135*  BUN 22*   < > 23* 27*  CREATININE 2.66*   < > 2.96* 3.36*  CALCIUM 8.3*   < > 8.0* 7.9*  MG 1.7  --   --  2.2  PHOS  --   --   --  5.7*   < > = values in this interval not displayed.   Liver Function Tests Recent Labs    09/17/20 1310 09/18/20 1230  AST 77* 73*  ALT 71* 59*  ALKPHOS 57 46  BILITOT  1.4* 1.1  PROT 7.1 6.5  ALBUMIN 3.3* 3.0*   No results for input(s): LIPASE, AMYLASE in the last 72 hours. Cardiac Enzymes No results for input(s): CKTOTAL, CKMB, CKMBINDEX, TROPONINI in the last 72 hours.  BNP: BNP (last 3 results) Recent Labs    09/17/20 0836  BNP 1,001.0*    ProBNP (last 3 results) No results for input(s): PROBNP in the last 8760 hours.   D-Dimer No results for input(s): DDIMER in the last 72 hours. Hemoglobin A1C No results for input(s): HGBA1C in the last 72 hours. Fasting Lipid Panel Recent Labs    09/19/20 0418  TRIG 161*   Thyroid Function Tests Recent Labs    09/17/20 0904  TSH 4.471    Other results:   Imaging    No results found.   Medications:     Scheduled Medications: . chlorhexidine gluconate (MEDLINE KIT)  15 mL Mouth Rinse BID  . Chlorhexidine Gluconate Cloth  6 each Topical Daily  . docusate  100 mg Per Tube BID  . enoxaparin (LOVENOX) injection  60 mg Subcutaneous Q24H  . feeding supplement (PROSource TF)  45 mL Per Tube BID   . feeding supplement (VITAL HIGH PROTEIN)  1,000 mL Per Tube Q24H  . folic acid  1 mg Per Tube Daily  . insulin aspart  2-6 Units Subcutaneous Q4H  . ipratropium-albuterol  3 mL Nebulization Q6H  . mouth rinse  15 mL Mouth Rinse 10 times per day  . multivitamin  15 mL Per Tube Daily  . pantoprazole (PROTONIX) IV  40 mg Intravenous Daily  . polyethylene glycol  17 g Per Tube Daily  . sodium chloride flush  10-40 mL Intracatheter Q12H  . sodium chloride flush  3 mL Intravenous Q12H  . thiamine  100 mg Per Tube Daily    Infusions: . sodium chloride    . sodium chloride Stopped (09/18/20 0845)  . amiodarone 30 mg/hr (09/19/20 0645)  . fentaNYL infusion INTRAVENOUS 125 mcg/hr (09/19/20 0645)  . furosemide (LASIX) infusion 8 mg/hr (09/19/20 0645)  . midazolam Stopped (09/18/20 0914)  . milrinone 0.25 mcg/kg/min (09/19/20 0645)  . norepinephrine (LEVOPHED) Adult infusion 22 mcg/min (09/19/20 0645)  . propofol (DIPRIVAN) infusion 20 mcg/kg/min (09/19/20 0645)    PRN Medications: sodium chloride, acetaminophen, fentaNYL, midazolam, ondansetron (ZOFRAN) IV, sodium chloride flush, sodium chloride flush     Assessment/Plan   1. A/C Systolic HF -->Cardiogenic Shock + ? Developing Septic Shock  - ECHO EF 20% with moderate RV failure. - CO-OX 81%. On norepi 22 mcg + milrinone 0.25 mcg.  - CVP 13. Continue  Lasix gtt at 8 ml/hr - Concern for developing Septic Shock 2/2 ? RLL PNA w/ increased pressor requirements, fever and leukocytosis. Will d/w PCCM initiation of abx.  - No BB with shock - No spiro/arin with AKI - Once stabilized he will need cath to further evaluate   2. Acute Respiratory Failure - Intubated 10/5. CCM following. - ? Developing RLL PNA - abx per PCCM   3. PAF - Has history of A flutter back in 2019. Off xarelto due cost for 1 year.  - If he goes in A flutter will add anticoagulation.   4. SVT - S/p emergent cardioversion 10/5 - remains in NSR currently  -  Continue amio drip 30 mg per hour.  5. AKI - Worsening renal function in the setting of shock and ATN - Creatinine 1.5>1.9>2.6>3.0>3.4  - Nonoliguric - monitor closely   6.  ETOH  -  Watch for withdrawal.  - Place on CIWA  7.  Tobacco Abuse    Length of Stay: 2  Lyda Jester, PA-C  09/19/2020, 7:15 AM  Advanced Heart Failure Team Pager [phone] (M-F; 7a - 4p)  Please contact [facility] for night-coverage after hours (4p -7a ) and weekends on [email or web address]  Agree with above.  Remains intubated/sedated. Febrile to 102.3 with increasing pressor requirements overnight. On NE 22 and milrinone 0.25. Co-ox 81%. CXR with developing RLL infiltrate concerning for aspiration. WBC 10.8. Creatinine worse.   General:  Intubated/sedated HEENT: normal + ETT and OG Neck: supple. RIJ TLC Carotids 2+ bilat; no bruits. No lymphadenopathy or thryomegaly appreciated. Cor: PMI nondisplaced. Regular rate & rhythm. No rubs, gallops or murmurs. Lungs: coarse Abdomen: obese soft, nontender, nondistended. No hepatosplenomegaly. No bruits or masses. Good bowel sounds. Extremities: no cyanosis, clubbing, rash, edema. warm Neuro: intubated/sedated  Remains critically ill with MSOF. Clinical situation complicated by RLL infiltrate (likely aspiration) with developing sepsis. Renal function also worse.   Would stop IV lasix and milrinone. Continue NE. Add VP as needed. Continue broad spectrum abx - may need change to zosyn. Check PCT. Continue IV amio for SVT.   CRITICAL CARE Performed by: Glori Bickers  Total critical care time: 35 minutes  Critical care time was exclusive of separately billable procedures and treating other patients.  Critical care was necessary to treat or prevent imminent or life-threatening deterioration.  Critical care was time spent personally by me (independent of midlevel providers or residents) on the following activities: development of treatment plan with patient  and/or surrogate as well as nursing, discussions with consultants, evaluation of patient's response to treatment, examination of patient, obtaining history from patient or surrogate, ordering and performing treatments and interventions, ordering and review of laboratory studies, ordering and review of radiographic studies, pulse oximetry and re-evaluation of patient's condition.  Glori Bickers, MD  8:16 AM

## 2020-09-19 NOTE — Progress Notes (Signed)
Initial Nutrition Assessment  DOCUMENTATION CODES:   Obesity unspecified  INTERVENTION:   TF via OG tube: Vital High Protein at 50 ml/h (1200 ml per day) Prosource TF 90 ml TID  Provides 1440 kcal (1854 kcal total with propofol), 171 gm protein, 1003 ml free water daily  NUTRITION DIAGNOSIS:   Inadequate oral intake related to inability to eat as evidenced by NPO status. Ongoing  GOAL:   Provide needs based on ASPEN/SCCM guidelines  Met with TF  MONITOR:   Vent status, Labs, I & O's  REASON FOR ASSESSMENT:   Consult Enteral/tube feeding initiation and management  ASSESSMENT:   39 yo male admitted 10/5 from HF clinic with acute decompensated HF. Required emergent cardioversion and intubation after admission. PMH includes ETOH abuse, a flutter, tobacco abuse, chronic HF, NICM.   Discussed patient in ICU rounds and with RN today. Sputum and blood being cultured to evaluate source of infection. Tolerating TF at 10 ml/h this morning. Received MD Consult for TF initiation and management. OG tube in place.  Patient remains intubated on ventilator support. MV: 9.5 L/min Temp (24hrs), Avg:100.2 F (37.9 C), Min:98.9 F (37.2 C), Max:102.3 F (39.1 C)  Propofol: 15.7 ml/hr providing 414 kcal from lipid.  Labs reviewed. Triglycerides 161 CBG: 105-143  Medications reviewed and include vasopressin, levophed, propofol, colace, folic acid, novolog, MVI, miralax, thiamine.   UOP 1470 ml x 24 hours I/O +379.9 ml since admission  NUTRITION - FOCUSED PHYSICAL EXAM:  unable to complete  Diet Order:   Diet Order            Diet NPO time specified  Diet effective now                 EDUCATION NEEDS:   Not appropriate for education at this time  Skin:  Skin Assessment: Reviewed RN Assessment  Last BM:  no BM documented  Height:   Ht Readings from Last 1 Encounters:  09/17/20 6' (1.829 m)    Weight:   Wt Readings from Last 1 Encounters:  09/19/20  124.9 kg    Ideal Body Weight:  80.9 kg  BMI:  Body mass index is 37.34 kg/m.  Estimated Nutritional Needs:   Kcal:  3435-6861  Protein:  160-180 gm  Fluid:  2 L    Lucas Mallow, RD, LDN, CNSC Please refer to Amion for contact information.

## 2020-09-19 NOTE — Progress Notes (Signed)
Pharmacy Antibiotic Note  Antonio Tapia is a 39 y.o. male admitted on 09/17/2020 with cardiogenic shock, now concerned for septic shock.  Pharmacy has been consulted for vancomycin and zosyn dosing.  Today, patient is afebrile but had multiple fevers yesterday. His WBC has increased to 10.8 and continues to require vasopressors and remains intubated. Since admission, his AKI has worsened with Scr up to 3.36. Given his clinical picture, plan to start empiric antibiotics for sepsis 2/2 possible aspiration PNA.    Plan: Zosyn 3.375g IV q8h Vancomycin 2500mg  IV x1 Monitor renal function for further vancomycin doses Monitor fevers, WBC, and clinical improvement F/u cultures and sensitivities    Height: 6' (182.9 cm) Weight: 124.9 kg (275 lb 5.7 oz) IBW/kg (Calculated) : 77.6  Temp (24hrs), Avg:100.2 F (37.9 C), Min:98.9 F (37.2 C), Max:102.3 F (39.1 C)  Recent Labs  Lab 09/17/20 0904 09/17/20 1310 09/17/20 1503 09/18/20 0424 09/18/20 1251 09/19/20 0418  WBC 5.0 5.9  --  5.6  --  10.8*  CREATININE 1.43* 1.90*  --  2.66* 2.96* 3.36*  LATICACIDVEN  --  2.8* 1.8  --   --   --     Estimated Creatinine Clearance: 40.3 mL/min (A) (by C-G formula based on SCr of 3.36 mg/dL (H)).    No Known Allergies  Antimicrobials this admission: Vancomycin 10/7 >>  Zosyn 10/7 >>   Dose adjustments this admission: N/A  Microbiology results: 10/5 MRSA PCR - neg  10/7 TA - sent 10/7 Blood cx x2 - sent   Thank you for allowing pharmacy to be a part of this patient's care.  12/7, PharmD PGY1 Acute Care Pharmacy Resident 09/19/2020 9:06 AM  Please check [email or web address] for unit-specific pharmacy phone numbers.

## 2020-09-19 NOTE — Progress Notes (Signed)
eLink Physician-Brief Progress Note Patient Name: Antonio Tapia DOB: 27-Feb-1981 MRN: [id number]   Date of Service  09/19/2020  HPI/Events of Note  Patient is on the ventilator, he needs bilateral wrist restraints renewed.  eICU Interventions  Restraints renewed.        Thomasene Lot Sarajean Dessert 09/19/2020, 11:16 PM

## 2020-09-19 NOTE — Progress Notes (Addendum)
NAME:  Fahim Kats, MRN:  [id number], DOB:  06/13/1981, LOS: 2 ADMISSION DATE:  09/17/2020, CONSULTATION DATE:  10/05 REFERRING MD:  Dr Gala Romney CHIEF COMPLAINT:  SVT   Brief History    Dahmir Epperly is a 39 yr old male with PMH EOTH abuse, A flutter, tobacco abuse, chronic systolic heart failure admitted on 10/5 for SVT. Now in cardiogenic shock.  History of present illness    Mr. Petrovich is a 39 y/o gentleman with a history of cardiomyopathy (unknown if ischemic), permanent Afib not on AC, and chronic HFrEF who has been off GDMT x 1 year and presented to outpatient cardiology clinic today with acute decompensated heart failure.  He was direct admitted to the hospital, subsequently developed SVT with HR as high as 190 who required emergent DCCV. He was intubated for acute respiratory failure due to cardiogenic pulmonary edema and need for DCCV. He has been started on inotropes to assist with diuresis.   Past Medical History   EOTH abuse A- flutter Tobacco abuse HFrEF HTN   Significant Hospital Events   10/5: Admitted, cardioversion, intubated  Consults:  HF   PCCM  Procedures:  10/05 Intubation, CVC insertion, arterial line   Significant Diagnostic Tests:   Echo in 2019: EF 30-35%, systolic functionmoderately to severely reduced, Diffuse hypokinesis. Right ventricular systolic presure 53 mmHg, suggesting at least moderate pulmonary hypertension.  Echo pending for 10/5: LVEF 40-45% with global hypokinesis, G2DD  CXR: ETT tip terminates 3.9 cm above the carina. A RIGHT IJ CVC tip terminates over the superior cavoatrial junction. Right sided pleural effusion. Infection Vs pulmonary edema     Micro Data:  COVID neg   Antimicrobials:  vanc 10/7> Zosyn 10/7>  Interim history/subjective:  Febrile yesterday. Wakes up when sedation decreased   Objective   Blood pressure (!) 119/59, pulse (!) 102, temperature 99.5 F (37.5 C), temperature source Axillary, resp. rate 16,  height 6' (1.829 m), weight 124.9 kg, SpO2 99 %. CVP:  [13 mmHg-30 mmHg] 13 mmHg  Vent Mode: PRVC FiO2 (%):  [35 %] 35 % Set Rate:  [16 bmp] 16 bmp Vt Set:  [620 mL] 620 mL PEEP:  [5 cmH20] 5 cmH20 Plateau Pressure:  [21 cmH20-30 cmH20] 26 cmH20   Intake/Output Summary (Last 24 hours) at 09/19/2020 0839 Last data filed at 09/19/2020 0645 Gross per 24 hour  Intake 2163.6 ml  Output 1740 ml  Net 423.6 ml   Filed Weights   09/18/20 0645 09/19/20 0500  Weight: 126.9 kg 124.9 kg    Examination: General: critically ill appearing man laying in bed intubated, sedated HEENT: [place]/AT, eyes anicteric. Oral mucosa moist Lungs: rhales on right, CTA on left. Thick purulent tracheal secretions. Cardiovascular: tachycardic, regular rhythm  Abdomen: soft, NT, ND Extremities: less edema Neuro: resists eye opening for pupillary exam, RASS -4 Skin: diaphoretic, warm  Resolved Hospital Problem list   N/a  Assessment & Plan:  Acute hypoxic respiratory failure due to cardiogenic pulmonary edema -Con't LTVV, Pplat at goal -VAP prevention protocol -Daily SAT & SBT when appropriate- not optimized yet. -Propofol or Versed are preferred sedatives as history of alcohol abuse.  Septic shock; acute on chronic HFrEF; cardiogenic shock improved based on co-ox SVT, s/p DCCV -Continue amiodarone -D/c milrinone due to concern for septic shock; co-oc impo -Continue norepinephrine to maintain MAP greater than 65. -Strict I/O -Stop Lasix drip.  -serial co-ox -Unable to tolerate guideline directed heart failure therapy due to shock -Eventually planning for ischemic work-up when  stable -cultures, empiric antibiotics  AKI; cardiorenal syndrome plus likely sepsis -Continue to monitor renal function -Strict I/O -Renally dose meds and avoid nephrotoxic meds -diuretics.  Elevated transaminases due to congestive hepatopathy, possibly due to ETOH use -Continue to monitor  Hypokalemia Hypomagnesemia   -Continue to monitor -Replete PRN  Acute anemia likely due to critical illness -Transfuse for hemoglobin less than 7-8 or hemodynamically significant bleeding. -Continue to monitor  Hx of EOTH abuse -Supplemental vitamins -increasing TF rate today, consult RD -Sedation ideally with midazolam or propofol to prevent withdrawal -May need Librium taper post extubation  Tobacco abuse -Nicotine patch -Encourage smoking cessation when appropriate  Best practice:   Diet: NPO  Pain/Anxiety/Delirium protocol (if indicated): Fentanyl, propofol, versed   VAP protocol (if indicated): yes DVT prophylaxis: Lovenox  GI prophylaxis: Protonix  Glucose control:  Mobility: bed rest Code Status: FULL  Family Communication: per primary Disposition: ICU   Labs   CBC: Recent Labs  Lab 09/17/20 0904 09/17/20 1212 09/17/20 1310 09/17/20 1403 09/18/20 0424 09/18/20 0753 09/19/20 0418  WBC 5.0  --  5.9  --  5.6  --  10.8*  HGB 13.3   < > 12.8* 13.6 12.0* 12.6* 12.5*  HCT 40.5   < > 39.5 40.0 36.2* 37.0* 38.0*  MCV 102.0*  --  100.3*  --  99.2  --  102.2*  PLT 247  --  275  --  210  --  230   < > = values in this interval not displayed.    Basic Metabolic Panel: Recent Labs  Lab 09/17/20 0904 09/17/20 1212 09/17/20 1310 09/17/20 1310 09/17/20 1403 09/17/20 1503 09/18/20 0424 09/18/20 0753 09/18/20 1251 09/19/20 0418  NA 138   < > 138   < > 140  --  138 140 136 138  K 3.7   < > 3.8   < > 3.7  --  3.1* 3.2* 3.8 4.3  CL 103  --  104  --   --   --  103  --  103 104  CO2 25  --  19*  --   --   --  23  --  22 22  GLUCOSE 113*  --  182*  --   --   --  108*  --  126* 135*  BUN 17  --  19  --   --   --  22*  --  23* 27*  CREATININE 1.43*  --  1.90*  --   --   --  2.66*  --  2.96* 3.36*  CALCIUM 8.9  --  8.4*  --   --   --  8.3*  --  8.0* 7.9*  MG  --   --   --   --   --  1.8 1.7  --   --  2.2  PHOS  --   --   --   --   --   --   --   --   --  5.7*   < > = values in this interval  not displayed.   GFR: Estimated Creatinine Clearance: 40.3 mL/min (A) (by C-G formula based on SCr of 3.36 mg/dL (H)). Recent Labs  Lab 09/17/20 0904 09/17/20 1310 09/17/20 1503 09/18/20 0424 09/19/20 0418  WBC 5.0 5.9  --  5.6 10.8*  LATICACIDVEN  --  2.8* 1.8  --   --     Liver Function Tests: Recent Labs  Lab 09/17/20 0904 09/17/20 1310 09/18/20  1230  AST 71* 77* 73*  ALT 72* 71* 59*  ALKPHOS 56 57 46  BILITOT 1.2 1.4* 1.1  PROT 7.4 7.1 6.5  ALBUMIN 3.4* 3.3* 3.0*   No results for input(s): LIPASE, AMYLASE in the last 168 hours. No results for input(s): AMMONIA in the last 168 hours.  ABG    Component Value Date/Time   PHART 7.428 09/18/2020 0753   PCO2ART 36.6 09/18/2020 0753   PO2ART 110 (H) 09/18/2020 0753   HCO3 24.1 09/18/2020 0753   TCO2 25 09/18/2020 0753   ACIDBASEDEF 1.0 09/17/2020 1403   O2SAT 81.2 09/19/2020 0418     Coagulation Profile: No results for input(s): INR, PROTIME in the last 168 hours.  Cardiac Enzymes: No results for input(s): CKTOTAL, CKMB, CKMBINDEX, TROPONINI in the last 168 hours.  HbA1C: Hgb A1c MFr Bld  Date/Time Value Ref Range Status  10/12/2018 03:35 PM 5.8 (H) 4.8 - 5.6 % Final    Comment:             Prediabetes: 5.7 - 6.4          Diabetes: >6.4          Glycemic control for adults with diabetes: <7.0     CBG: Recent Labs  Lab 09/18/20 1532 09/18/20 1943 09/18/20 2313 09/19/20 0349 09/19/20 0801  GLUCAP 91 117* 96 105* 143*     This patient is critically ill with multiple organ system failure which requires frequent high complexity decision making, assessment, support, evaluation, and titration of therapies. This was completed through the application of advanced monitoring technologies and extensive interpretation of multiple databases. During this encounter critical care time was devoted to patient care services described in this note for 34 minutes.  Steffanie Dunn, DO 09/19/20 8:57 AM [facility]

## 2020-09-20 DIAGNOSIS — J15211 Pneumonia due to Methicillin susceptible Staphylococcus aureus: Secondary | ICD-10-CM

## 2020-09-20 LAB — BASIC METABOLIC PANEL
Anion gap: 11 (ref 5–15)
Anion gap: 13 (ref 5–15)
BUN: 32 mg/dL — ABNORMAL HIGH (ref 6–20)
BUN: 31 mg/dL — ABNORMAL HIGH (ref 6–20)
CO2: 24 mmol/L (ref 22–32)
CO2: 24 mmol/L (ref 22–32)
Calcium: 7.9 mg/dL — ABNORMAL LOW (ref 8.9–10.3)
Calcium: 7.8 mg/dL — ABNORMAL LOW (ref 8.9–10.3)
Chloride: 103 mmol/L (ref 98–111)
Chloride: 102 mmol/L (ref 98–111)
Creatinine, Ser: 2.21 mg/dL — ABNORMAL HIGH (ref 0.61–1.24)
Creatinine, Ser: 2.03 mg/dL — ABNORMAL HIGH (ref 0.61–1.24)
GFR calc non Af Amer: 36 mL/min — ABNORMAL LOW (ref >60)
GFR, Estimated: 40 mL/min — ABNORMAL LOW (ref >60)
Glucose, Bld: 149 mg/dL — ABNORMAL HIGH (ref 70–99)
Glucose, Bld: 155 mg/dL — ABNORMAL HIGH (ref 70–99)
Potassium: 3.9 mmol/L (ref 3.5–5.1)
Potassium: 3.5 mmol/L (ref 3.5–5.1)
Sodium: 138 mmol/L (ref 135–145)
Sodium: 139 mmol/L (ref 135–145)

## 2020-09-20 LAB — GLUCOSE, CAPILLARY
Glucose-Capillary: 114 mg/dL — ABNORMAL HIGH (ref 70–99)
Glucose-Capillary: 118 mg/dL — ABNORMAL HIGH (ref 70–99)
Glucose-Capillary: 119 mg/dL — ABNORMAL HIGH (ref 70–99)
Glucose-Capillary: 125 mg/dL — ABNORMAL HIGH (ref 70–99)
Glucose-Capillary: 130 mg/dL — ABNORMAL HIGH (ref 70–99)
Glucose-Capillary: 137 mg/dL — ABNORMAL HIGH (ref 70–99)
Glucose-Capillary: 143 mg/dL — ABNORMAL HIGH (ref 70–99)
Glucose-Capillary: 146 mg/dL — ABNORMAL HIGH (ref 70–99)

## 2020-09-20 LAB — CBC
HCT: 38.4 % — ABNORMAL LOW (ref 39.0–52.0)
Hemoglobin: 12.3 g/dL — ABNORMAL LOW (ref 13.0–17.0)
MCH: 33.2 pg (ref 26.0–34.0)
MCHC: 32 g/dL (ref 30.0–36.0)
MCV: 103.8 fL — ABNORMAL HIGH (ref 80.0–100.0)
Platelets: 216 10*3/uL (ref 150–400)
RBC: 3.7 MIL/uL — ABNORMAL LOW (ref 4.22–5.81)
RDW: 13.6 % (ref 11.5–15.5)
WBC: 6.5 10*3/uL (ref 4.0–10.5)
nRBC: 0 % (ref 0.0–0.2)

## 2020-09-20 LAB — VANCOMYCIN, RANDOM: Vancomycin Rm: 5

## 2020-09-20 LAB — COOXEMETRY PANEL
Carboxyhemoglobin: 0.9 % (ref 0.5–1.5)
Methemoglobin: 0.6 % (ref 0.0–1.5)
O2 Saturation: 60.1 %
Total hemoglobin: 15.1 g/dL (ref 12.0–16.0)

## 2020-09-20 LAB — PHOSPHORUS: Phosphorus: 4.4 mg/dL (ref 2.5–4.6)

## 2020-09-20 LAB — PROCALCITONIN: Procalcitonin: 1.54 ng/mL

## 2020-09-20 LAB — MAGNESIUM: Magnesium: 2.4 mg/dL (ref 1.7–2.4)

## 2020-09-20 LAB — TRIGLYCERIDES: Triglycerides: 104 mg/dL (ref <150)

## 2020-09-20 MED ORDER — POTASSIUM CHLORIDE 20 MEQ PO PACK
20.0000 meq | PACK | Freq: Once | ORAL | Status: AC
  Administered 2020-09-20: 20 meq
  Filled 2020-09-20: qty 1

## 2020-09-20 MED ORDER — VANCOMYCIN HCL IN DEXTROSE 1-5 GM/200ML-% IV SOLN
1000.0000 mg | Freq: Two times a day (BID) | INTRAVENOUS | Status: DC
  Administered 2020-09-20 – 2020-09-21 (×2): 1000 mg via INTRAVENOUS
  Filled 2020-09-20 (×2): qty 200

## 2020-09-20 MED ORDER — POTASSIUM CHLORIDE 20 MEQ PO PACK
40.0000 meq | PACK | Freq: Once | ORAL | Status: AC
  Administered 2020-09-20: 40 meq
  Filled 2020-09-20: qty 2

## 2020-09-20 MED ORDER — FUROSEMIDE 10 MG/ML IJ SOLN
80.0000 mg | Freq: Once | INTRAMUSCULAR | Status: AC
  Administered 2020-09-20: 80 mg via INTRAVENOUS
  Filled 2020-09-20: qty 8

## 2020-09-20 MED ORDER — FUROSEMIDE 10 MG/ML IJ SOLN
INTRAMUSCULAR | Status: AC
  Filled 2020-09-20: qty 4

## 2020-09-20 NOTE — Progress Notes (Signed)
NAME:  Antonio Tapia, MRN:  [id number], DOB:  15-Aug-1981, LOS: 3 ADMISSION DATE:  09/17/2020, CONSULTATION DATE:  10/05 REFERRING MD:  Dr Gala Romney CHIEF COMPLAINT:  SVT   Brief History    Antonio Tapia is a 39 yr old male with PMH EOTH abuse, A flutter, tobacco abuse, chronic systolic heart failure admitted on 10/5 for SVT. Now in cardiogenic shock.  History of present illness    Antonio Tapia is a 38 y/o gentleman with a history of cardiomyopathy (unknown if ischemic), permanent Afib not on AC, and chronic HFrEF who has been off GDMT x 1 year and presented to outpatient cardiology clinic today with acute decompensated heart failure.  He was direct admitted to the hospital, subsequently developed SVT with HR as high as 190 who required emergent DCCV. He was intubated for acute respiratory failure due to cardiogenic pulmonary edema and need for DCCV. He has been started on inotropes to assist with diuresis.   Past Medical History   EOTH abuse A- flutter Tobacco abuse HFrEF HTN   Significant Hospital Events   10/5: Admitted, cardioversion, intubated  Consults:  HF   PCCM  Procedures:  10/05 Intubation, CVC insertion, arterial line   Significant Diagnostic Tests:   Echo in 2019: EF 30-35%, systolic functionmoderately to severely reduced, Diffuse hypokinesis. Right ventricular systolic presure 53 mmHg, suggesting at least moderate pulmonary hypertension.  Echo pending for 10/5: LVEF 40-45% with global hypokinesis, G2DD  CXR: ETT tip terminates 3.9 cm above the carina. A RIGHT IJ CVC tip terminates over the superior cavoatrial junction. Right sided pleural effusion. Infection Vs pulmonary edema     Micro Data:  COVID neg  Trach aspirate 10/7> staph aureus> Blood culture 7/7>  Antimicrobials:  vanc 10/7> Zosyn 10/7>  Interim history/subjective:  No acute events overnight.  Objective   Blood pressure 114/80, pulse 78, temperature 99 F (37.2 C), resp. rate 16, height 6'  (1.829 m), weight 126.4 kg, SpO2 98 %. CVP:  [15 mmHg-25 mmHg] 17 mmHg  Vent Mode: PRVC FiO2 (%):  [35 %] 35 % Set Rate:  [16 bmp] 16 bmp Vt Set:  [620 mL] 620 mL PEEP:  [5 cmH20] 5 cmH20 Plateau Pressure:  [23 cmH20-25 cmH20] 25 cmH20   Intake/Output Summary (Last 24 hours) at 09/20/2020 1813 Last data filed at 09/20/2020 1700 Gross per 24 hour  Intake 2829.93 ml  Output 3440 ml  Net -610.07 ml   Filed Weights   09/18/20 0645 09/19/20 0500 09/20/20 0500  Weight: 126.9 kg 124.9 kg 126.4 kg    Examination: General: Critically ill-appearing young man lying in bed intubated, sedated HEENT: Antonio Tapia, eyes anicteric.  Oral mucosa moist.  Endotracheal tube in place. Lungs: Rhonchi bilaterally cleared with suctioning.  Copious tan secretions per endotracheal tube. Cardiovascular: Regular rate and rhythm Abdomen: Soft, nontender, nondistended Extremities: Persistent mild edema, no clubbing or cyanosis Neuro: Resist eye opening during exam.  Pupils reactive.  RASS -4. Skin: Mildly diaphoretic, no rashes  Resolved Hospital Problem list   N/a  Assessment & Plan:  Acute hypoxic respiratory failure due to cardiogenic pulmonary edema and acute staph aureus pneumonia -Con't LTVV, Pplat at goal -VAP prevention protocol -Daily SAT and SBT when appropriate.  Good secretions preclude safe extubation at this point. -Propofol or Versed are preferred sedatives with his history of alcohol abuse.  Septic shock -Continue empiric antibiotics -Continue to follow trach aspirate -Vasopressors as required to maintain MAP greater than 65-norepinephrine and vasopressin.  Acute on chronic HFrEF; cardiogenic  shock improved based on co-ox SVT, s/p DCCV -Continue amiodarone infusion -Continue norepinephrine to maintain MAP greater than 65. -Strict I/O -Stop Lasix drip.  -serial co-ox:  74> 75> 81>60 off milrinone -Unable to tolerate guideline directed heart failure therapy due to shock -Eventually  planning for ischemic work-up when stable  AKI; cardiorenal syndrome plus likely sepsis--improving -Continue to monitor -Strict I's/O -Renally dose meds and avoid nephrotoxic medications -Careful diuresis.  Elevated transaminases due to congestive hepatopathy, possibly due to ETOH use -Continue to monitor  Acute anemia likely due to critical illness, chronic alcohol use -Transfuse for hemoglobin less than 7 or hemodynamically significant bleeding -Continue to monitor  Hx of EOTH abuse -Supplemental vitamins and nutrition. -Midazolam or propofol would prevent alcohol withdrawal syndrome.  Hopefully will not have to worry about DTs given the duration that he has been off of alcohol on these agents.  Tobacco abuse -Nicotine patch -Encourage smoking cessation when appropriate  Best practice:   Diet: NPO  Pain/Anxiety/Delirium protocol (if indicated): Fentanyl, propofol, versed   VAP protocol (if indicated): yes DVT prophylaxis: Lovenox  GI prophylaxis: Protonix  Glucose control:  Mobility: bed rest Code Status: FULL  Family Communication: will update father at bedside Disposition: ICU   Labs   CBC: Recent Labs  Lab 09/17/20 0904 09/17/20 1212 09/17/20 1310 09/17/20 1310 09/17/20 1403 09/18/20 0424 09/18/20 0753 09/19/20 0418 09/20/20 0442  WBC 5.0  --  5.9  --   --  5.6  --  10.8* 6.5  HGB 13.3   < > 12.8*   < > 13.6 12.0* 12.6* 12.5* 12.3*  HCT 40.5   < > 39.5   < > 40.0 36.2* 37.0* 38.0* 38.4*  MCV 102.0*  --  100.3*  --   --  99.2  --  102.2* 103.8*  PLT 247  --  275  --   --  210  --  230 216   < > = values in this interval not displayed.    Basic Metabolic Panel: Recent Labs  Lab 09/17/20 1310 09/17/20 1503 09/18/20 0424 09/18/20 0753 09/18/20 1251 09/19/20 0418 09/19/20 1622 09/20/20 0442 09/20/20 1326  NA   < >  --  138   < > 136 138 138 138 139  K   < >  --  3.1*   < > 3.8 4.3 3.9 3.9 3.5  CL   < >  --  103   < > 103 104 105 103 102  CO2   <  >  --  23   < > 22 22 23 24 24   GLUCOSE   < >  --  108*   < > 126* 135* 135* 149* 155*  BUN   < >  --  22*   < > 23* 27* 26* 32* 31*  CREATININE   < >  --  2.66*   < > 2.96* 3.36* 2.66* 2.21* 2.03*  CALCIUM   < >  --  8.3*   < > 8.0* 7.9* 7.5* 7.9* 7.8*  MG  --  1.8 1.7  --   --  2.2  --  2.4  --   PHOS  --   --   --   --   --  5.7*  --  4.4  --    < > = values in this interval not displayed.   GFR: Estimated Creatinine Clearance: 67.1 mL/min (A) (by C-G formula based on SCr of 2.03 mg/dL (H)). Recent Labs  Lab 09/17/20 1310 09/17/20 1503 09/18/20 0424 09/19/20 0418 09/19/20 0839 09/20/20 0442  PROCALCITON  --   --   --   --  1.98 1.54  WBC 5.9  --  5.6 10.8*  --  6.5  LATICACIDVEN 2.8* 1.8  --   --   --   --     Liver Function Tests: Recent Labs  Lab 09/17/20 0904 09/17/20 1310 09/18/20 1230  AST 71* 77* 73*  ALT 72* 71* 59*  ALKPHOS 56 57 46  BILITOT 1.2 1.4* 1.1  PROT 7.4 7.1 6.5  ALBUMIN 3.4* 3.3* 3.0*   No results for input(s): LIPASE, AMYLASE in the last 168 hours. No results for input(s): AMMONIA in the last 168 hours.  ABG    Component Value Date/Time   PHART 7.428 09/18/2020 0753   PCO2ART 36.6 09/18/2020 0753   PO2ART 110 (H) 09/18/2020 0753   HCO3 24.1 09/18/2020 0753   TCO2 25 09/18/2020 0753   ACIDBASEDEF 1.0 09/17/2020 1403   O2SAT 60.1 09/20/2020 0442     Coagulation Profile: No results for input(s): INR, PROTIME in the last 168 hours.  Cardiac Enzymes: No results for input(s): CKTOTAL, CKMB, CKMBINDEX, TROPONINI in the last 168 hours.  HbA1C: Hgb A1c MFr Bld  Date/Time Value Ref Range Status  10/12/2018 03:35 PM 5.8 (H) 4.8 - 5.6 % Final    Comment:             Prediabetes: 5.7 - 6.4          Diabetes: >6.4          Glycemic control for adults with diabetes: <7.0     CBG: Recent Labs  Lab 09/20/20 0034 09/20/20 0339 09/20/20 0742 09/20/20 1121 09/20/20 1518  GLUCAP 137* 146* 130* 118* 125*   This patient is critically ill  with multiple organ system failure which requires frequent high complexity decision making, assessment, support, evaluation, and titration of therapies. This was completed through the application of advanced monitoring technologies and extensive interpretation of multiple databases. During this encounter critical care time was devoted to patient care services described in this note for 36 minutes.  Steffanie Dunn, DO 09/20/20 6:25 PM [facility]

## 2020-09-20 NOTE — Progress Notes (Signed)
Pharmacy Antibiotic Note  Antonio Tapia is a 39 y.o. male admitted on 09/17/2020 with cardiogenic shock, now concerned for septic shock.  Pharmacy has been consulted for vancomycin and zosyn dosing.  Patient continues with occasional fevers but is currently afebrile. His WBC have improved to 6.5 since starting antibiotics although he still requires vasopressors and remains intubated. His AKI is improving with Scr down to 2.21. A VR drawn 24 hours after bolus dose was 5. Tracheal aspirate is showing staph aureus, final sensitivities pending. Blood cultures with no growth to date (<24 hours). Given improvement in renal function and likely improved clearance of vancomycin, will schedule vancomycin at this time. Will continue Zosyn for now but can likely discontinue pending final cultures.   Plan: Continue Zosyn 3.375g IV q8h Start vancomycin 1000mg  IV q12h Monitor renal function and clinical improvement F/u cultures and sensitivities    Height: 6' (182.9 cm) Weight: 126.4 kg (278 lb 10.6 oz) IBW/kg (Calculated) : 77.6  Temp (24hrs), Avg:100.3 F (37.9 C), Min:99.3 F (37.4 C), Max:101.7 F (38.7 C)  Recent Labs  Lab 09/17/20 0904 09/17/20 0904 09/17/20 1310 09/17/20 1310 09/17/20 1503 09/18/20 0424 09/18/20 1251 09/19/20 0418 09/19/20 1622 09/20/20 0442 09/20/20 1230  WBC 5.0  --  5.9  --   --  5.6  --  10.8*  --  6.5  --   CREATININE 1.43*   < > 1.90*   < >  --  2.66* 2.96* 3.36* 2.66* 2.21*  --   LATICACIDVEN  --   --  2.8*  --  1.8  --   --   --   --   --   --   VANCORANDOM  --   --   --   --   --   --   --   --   --   --  5   < > = values in this interval not displayed.    Estimated Creatinine Clearance: 61.6 mL/min (A) (by C-G formula based on SCr of 2.21 mg/dL (H)).    No Known Allergies  Antimicrobials this admission: Vancomycin 10/7 >>  Zosyn 10/7 >>   Microbiology results: 10/5 MRSA PCR - neg  10/7 TA - abundant staph aureus  10/7 Blood cx x2 - ngtd  Thank you  for allowing pharmacy to be a part of this patient's care.  12/7, PharmD PGY1 Acute Care Pharmacy Resident 09/20/2020 2:11 PM  Please check [email or web address] for unit-specific pharmacy phone numbers.

## 2020-09-20 NOTE — Progress Notes (Addendum)
Advanced Heart Failure Rounding Note  PCP-Cardiologist: No primary care provider on file.   Subjective:    - 10/5 Admitted with A/C Sysolic HF-->Decompensated d/t SVT.  Underwent Emergent DC-CV and intubation. Placed on Norepi + milrinone+ amio drip.  - 10/7 developed component of septic shock, CXR w/ RLL infiltrate (likely aspiration). PTC 1.98. Vanc + Zosyn added. VP + NE.   Febrile overnight, mTemp 101.7. WBC back down today, 11>>7K. PCT down-trending, 1.54 today. Continues w/ heavy tracheal secretions, cultures growing abundant gram positive cocci in clusters. BC NGTD. On Vanc/zosyn  Off milrinone. On NE 12 + VP 0.03. Co-ox marginal at 60%. MAPs 90s on a-line.   Dieretics held yesterday. Fluid status trending back up. CVP 20.  Scr improving, 3.4>>2.66>>2.21.  Continues in NSR. No further arrhythmias. On amio gtt at 30/hr     Objective:   Weight Range: 126.4 kg Body mass index is 37.79 kg/m.   Vital Signs:   Temp:  [98.8 F (37.1 C)-101.7 F (38.7 C)] 99.3 F (37.4 C) (10/08 0743) Pulse Rate:  [84-109] 84 (10/08 0700) Resp:  [0-25] 16 (10/08 0700) BP: (100-133)/(55-86) 105/79 (10/08 0700) SpO2:  [98 %-100 %] 99 % (10/08 0700) Arterial Line BP: (85-140)/(52-82) 115/74 (10/08 0700) FiO2 (%):  [35 %] 35 % (10/08 0823) Weight:  [126.4 kg] 126.4 kg (10/08 0500) Last BM Date:  (PTA)  Weight change: Filed Weights   09/18/20 0645 09/19/20 0500 09/20/20 0500  Weight: 126.9 kg 124.9 kg 126.4 kg    Intake/Output:   Intake/Output Summary (Last 24 hours) at 09/20/2020 0844 Last data filed at 09/20/2020 0700 Gross per 24 hour  Intake 3658.7 ml  Output 1600 ml  Net 2058.7 ml      Physical Exam   CVP 20 General:  Young male, intubated and sedated HEENT: + ETT Neck: Supple. JVP elevated to ear. Carotids 2+ bilat; no bruits. No lymphadenopathy or thyromegaly appreciated. + RIJ central line  Cor: PMI nondisplaced. Regular rate & rhythm. No rubs, or murmurs.  Lungs:  intubated and clear   Abdomen: obese, Soft, nontender, distended. No hepatosplenomegaly. No bruits or masses. Good bowel sounds. Extremities: No cyanosis, clubbing, or rash. Trace bilateral LEE.  Neuro: intubated and sedated    Telemetry   NSR 80s. No further SVT   EKG    No new EKG to review   Labs    CBC Recent Labs    09/19/20 0418 09/20/20 0442  WBC 10.8* 6.5  HGB 12.5* 12.3*  HCT 38.0* 38.4*  MCV 102.2* 103.8*  PLT 230 389   Basic Metabolic Panel Recent Labs    09/19/20 0418 09/19/20 0418 09/19/20 1622 09/20/20 0442  NA 138   < > 138 138  K 4.3   < > 3.9 3.9  CL 104   < > 105 103  CO2 22   < > 23 24  GLUCOSE 135*   < > 135* 149*  BUN 27*   < > 26* 32*  CREATININE 3.36*   < > 2.66* 2.21*  CALCIUM 7.9*   < > 7.5* 7.9*  MG 2.2  --   --  2.4  PHOS 5.7*  --   --  4.4   < > = values in this interval not displayed.   Liver Function Tests Recent Labs    09/17/20 1310 09/18/20 1230  AST 77* 73*  ALT 71* 59*  ALKPHOS 57 46  BILITOT 1.4* 1.1  PROT 7.1 6.5  ALBUMIN 3.3* 3.0*  No results for input(s): LIPASE, AMYLASE in the last 72 hours. Cardiac Enzymes No results for input(s): CKTOTAL, CKMB, CKMBINDEX, TROPONINI in the last 72 hours.  BNP: BNP (last 3 results) Recent Labs    09/17/20 0836  BNP 1,001.0*    ProBNP (last 3 results) No results for input(s): PROBNP in the last 8760 hours.   D-Dimer No results for input(s): DDIMER in the last 72 hours. Hemoglobin A1C No results for input(s): HGBA1C in the last 72 hours. Fasting Lipid Panel Recent Labs    09/20/20 0442  TRIG 104   Thyroid Function Tests Recent Labs    09/17/20 0904  TSH 4.471    Other results:   Imaging    No results found.   Medications:     Scheduled Medications: . chlorhexidine gluconate (MEDLINE KIT)  15 mL Mouth Rinse BID  . Chlorhexidine Gluconate Cloth  6 each Topical Daily  . docusate  100 mg Per Tube BID  . enoxaparin (LOVENOX) injection  60 mg  Subcutaneous Q24H  . feeding supplement (PROSource TF)  90 mL Per Tube TID  . feeding supplement (VITAL HIGH PROTEIN)  1,000 mL Per Tube Q24H  . folic acid  1 mg Per Tube Daily  . influenza vac split quadrivalent PF  0.5 mL Intramuscular Tomorrow-1000  . insulin aspart  2-6 Units Subcutaneous Q4H  . ipratropium-albuterol  3 mL Nebulization Q6H  . mouth rinse  15 mL Mouth Rinse 10 times per day  . multivitamin  15 mL Per Tube Daily  . pantoprazole (PROTONIX) IV  40 mg Intravenous Daily  . pneumococcal 23 valent vaccine  0.5 mL Intramuscular Tomorrow-1000  . polyethylene glycol  17 g Per Tube Daily  . potassium chloride  20 mEq Per Tube Once  . sodium chloride flush  10-40 mL Intracatheter Q12H  . sodium chloride flush  3 mL Intravenous Q12H  . thiamine  100 mg Per Tube Daily  . vancomycin variable dose per unstable renal function (pharmacist dosing)   Does not apply See admin instructions    Infusions: . sodium chloride    . sodium chloride 12.5 mL/hr at 09/20/20 0700  . amiodarone 30 mg/hr (09/20/20 0700)  . fentaNYL infusion INTRAVENOUS 125 mcg/hr (09/19/20 2201)  . midazolam Stopped (09/18/20 0914)  . norepinephrine (LEVOPHED) Adult infusion 12 mcg/min (09/20/20 0700)  . piperacillin-tazobactam (ZOSYN)  IV 12.5 mL/hr at 09/20/20 0700  . propofol (DIPRIVAN) infusion 20 mcg/kg/min (09/20/20 0800)  . vasopressin 0.03 Units/min (09/20/20 0700)    PRN Medications: sodium chloride, acetaminophen, fentaNYL, midazolam, ondansetron (ZOFRAN) IV, sodium chloride flush, sodium chloride flush     Assessment/Plan   1. A/C Systolic HF -->Cardiogenic Shock + Septic Shock  - ECHO EF 20% with moderate RV failure. - now off milrinone. Remains on NE 12 and VP 0.03. Co-ox 60%. Septic shock improving. MAPs 90s.  - stop VP. Continue NE for inotropic support. - CVP 20. Restart IV diuretics today. Give 80 mg x 1. Monitor response  - No BB with shock - No spiro/arin with AKI - Once stabilized  he will need cath to further evaluate  - follow CVPs and BMP   2. Acute Respiratory Failure - Intubated 10/5. CCM following. - continue abx for suspected aspiration PNA. Continue to follow PCT trend  - tracheal aspirate cultures growing abundant gram positive cocci in clusters. BC NGTD - appreciate PCCM assistance   3. PAF - Has history of A flutter back in 2019. Off xarelto due cost for  1 year.  - maintaining NSR. Monitor on NE  - If he goes in A flutter will add anticoagulation.   4. SVT - S/p emergent cardioversion 10/5 - remains in NSR currently  - Continue amio drip 30 mg per hour while on inotropes   5. AKI - Developed worsening renal function in the setting of shock and ATN - Creatinine trending back down 1.5>1.9>2.6>3.0>3.4>2.66>>2.21 - Nonoliguric - resume IV diuretics today  - monitor closely   6.  ETOH  - Watch for withdrawal.  - Place on CIWA  7.  Tobacco Abuse    Length of Stay: 3  Lyda Jester, PA-C  09/20/2020, 8:44 AM  Advanced Heart Failure Team Pager [phone] (M-F; 7a - 4p)  Please contact [facility] for night-coverage after hours (4p -7a ) and weekends on [email or web address]  Agree with above  Remains intubated/sedated.   Diuretics held and started on broad spectrum abx for presumed aspiration yesterday. Hemodynamically improved but remains on NE 12. Sputum GS with GPCs, PCT up. CVP climbing, Creatinine starting to come back down  Rhythm stable on IV amio  Co-ox 60% CVP 20    General:  Intubated/seated  HEENT: +ETT Neck: supple. RIJ TL  Cor: PMI nondisplaced. Regular rate & rhythm. No rubs, gallops or murmurs. Lungs: coarse Abdomen: obese soft, nontender, nondistended. No hepatosplenomegaly. No bruits or masses. Good bowel sounds. Extremities: no cyanosis, clubbing, rash, 1+ edema Neuro: intubated sedated   He remains critically ill with combination of septic shock (aspiration PNA) and cardiogenic shock (acute HF) c/b AKI. Continue broad  spectrum abx and NE. Resume diuresis. No vent wean yet. Continue amio for SVT.  CRITICAL CARE Performed by: Glori Bickers  Total critical care time: 45 minutes  Critical care time was exclusive of separately billable procedures and treating other patients.  Critical care was necessary to treat or prevent imminent or life-threatening deterioration.  Critical care was time spent personally by me (independent of midlevel providers or residents) on the following activities: development of treatment plan with patient and/or surrogate as well as nursing, discussions with consultants, evaluation of patient's response to treatment, examination of patient, obtaining history from patient or surrogate, ordering and performing treatments and interventions, ordering and review of laboratory studies, ordering and review of radiographic studies, pulse oximetry and re-evaluation of patient's condition.    Glori Bickers, MD  3:58 PM

## 2020-09-21 ENCOUNTER — Inpatient Hospital Stay (HOSPITAL_COMMUNITY): Payer: Self-pay

## 2020-09-21 LAB — BASIC METABOLIC PANEL
Anion gap: 10 (ref 5–15)
BUN: 35 mg/dL — ABNORMAL HIGH (ref 6–20)
CO2: 24 mmol/L (ref 22–32)
Calcium: 7.8 mg/dL — ABNORMAL LOW (ref 8.9–10.3)
Chloride: 103 mmol/L (ref 98–111)
Creatinine, Ser: 1.79 mg/dL — ABNORMAL HIGH (ref 0.61–1.24)
GFR, Estimated: 47 mL/min — ABNORMAL LOW (ref >60)
Glucose, Bld: 145 mg/dL — ABNORMAL HIGH (ref 70–99)
Potassium: 3.7 mmol/L (ref 3.5–5.1)
Sodium: 137 mmol/L (ref 135–145)

## 2020-09-21 LAB — GLUCOSE, CAPILLARY
Glucose-Capillary: 103 mg/dL — ABNORMAL HIGH (ref 70–99)
Glucose-Capillary: 104 mg/dL — ABNORMAL HIGH (ref 70–99)
Glucose-Capillary: 121 mg/dL — ABNORMAL HIGH (ref 70–99)
Glucose-Capillary: 124 mg/dL — ABNORMAL HIGH (ref 70–99)
Glucose-Capillary: 130 mg/dL — ABNORMAL HIGH (ref 70–99)
Glucose-Capillary: 156 mg/dL — ABNORMAL HIGH (ref 70–99)
Glucose-Capillary: 98 mg/dL (ref 70–99)

## 2020-09-21 LAB — MAGNESIUM: Magnesium: 2.2 mg/dL (ref 1.7–2.4)

## 2020-09-21 LAB — COOXEMETRY PANEL
Carboxyhemoglobin: 0.9 % (ref 0.5–1.5)
Methemoglobin: 0.9 % (ref 0.0–1.5)
O2 Saturation: 50.9 %
Total hemoglobin: 15.8 g/dL (ref 12.0–16.0)

## 2020-09-21 LAB — CULTURE, RESPIRATORY W GRAM STAIN

## 2020-09-21 LAB — TRIGLYCERIDES: Triglycerides: 100 mg/dL (ref <150)

## 2020-09-21 LAB — PROCALCITONIN: Procalcitonin: 1.38 ng/mL

## 2020-09-21 LAB — PHOSPHORUS: Phosphorus: 3.8 mg/dL (ref 2.5–4.6)

## 2020-09-21 MED ORDER — POTASSIUM CHLORIDE 20 MEQ PO PACK
40.0000 meq | PACK | Freq: Two times a day (BID) | ORAL | Status: DC
  Administered 2020-09-21 (×2): 40 meq
  Filled 2020-09-21 (×2): qty 2

## 2020-09-21 MED ORDER — CEFAZOLIN SODIUM-DEXTROSE 2-4 GM/100ML-% IV SOLN
2.0000 g | Freq: Three times a day (TID) | INTRAVENOUS | Status: AC
  Administered 2020-09-21 – 2020-09-28 (×23): 2 g via INTRAVENOUS
  Filled 2020-09-21 (×26): qty 100

## 2020-09-21 MED ORDER — FUROSEMIDE 10 MG/ML IJ SOLN
80.0000 mg | Freq: Once | INTRAMUSCULAR | Status: AC
  Administered 2020-09-21: 80 mg via INTRAVENOUS
  Filled 2020-09-21: qty 8

## 2020-09-21 MED ORDER — MILRINONE LACTATE IN DEXTROSE 20-5 MG/100ML-% IV SOLN
0.1250 ug/kg/min | INTRAVENOUS | Status: DC
  Administered 2020-09-21 – 2020-09-26 (×7): 0.125 ug/kg/min via INTRAVENOUS
  Filled 2020-09-21 (×7): qty 100

## 2020-09-21 MED ORDER — FUROSEMIDE 10 MG/ML IJ SOLN
15.0000 mg/h | INTRAVENOUS | Status: DC
  Administered 2020-09-21 – 2020-09-23 (×4): 15 mg/h via INTRAVENOUS
  Filled 2020-09-21: qty 21
  Filled 2020-09-21 (×2): qty 25
  Filled 2020-09-21: qty 5

## 2020-09-21 NOTE — Progress Notes (Signed)
Advanced Heart Failure Rounding Note  PCP-Cardiologist: No primary care provider on file.   Subjective:    - 10/5 Admitted with A/C Sysolic HF-->Decompensated d/t SVT.  Underwent Emergent DC-CV and intubation. Placed on Norepi + milrinone+ amio drip.  - 10/7 developed component of septic shock, CXR w/ RLL infiltrate (likely aspiration). PTC 1.98. Vanc + Zosyn added. VP + NE.   On NE 7 and VP 0.03. Milrinone off. Co-ox 51%. Creatinine improving. CVP 22. Weight up 3 pounds  CXR with large R effusion today (new). Growing MSSA in sputum  Tmax 101.5. On vanc/zosyn  Objective:   Weight Range: 127.6 kg Body mass index is 38.15 kg/m.   Vital Signs:   Temp:  [98.6 F (37 C)-101.5 F (38.6 C)] 99.2 F (37.3 C) (10/09 0744) Pulse Rate:  [75-85] 79 (10/09 0835) Resp:  [13-18] 16 (10/09 0835) BP: (97-139)/(60-91) 110/78 (10/09 0700) SpO2:  [89 %-100 %] 94 % (10/09 0835) Arterial Line BP: (98-154)/(35-94) 111/68 (10/09 0700) FiO2 (%):  [35 %-45 %] 40 % (10/09 0835) Weight:  [127.6 kg] 127.6 kg (10/09 0500) Last BM Date:  (PTA)  Weight change: Filed Weights   09/19/20 0500 09/20/20 0500 09/21/20 0500  Weight: 124.9 kg 126.4 kg 127.6 kg    Intake/Output:   Intake/Output Summary (Last 24 hours) at 09/21/2020 1016 Last data filed at 09/21/2020 0700 Gross per 24 hour  Intake 2426.14 ml  Output 2800 ml  Net -373.86 ml      Physical Exam   General:  Sedated on vent  HEENT: normal + ETT Neck: supple. + RIJ TLC Carotids 2+ bilat; no bruits. No lymphadenopathy or thryomegaly appreciated. Cor: PMI nondisplaced. Regular rate & rhythm. No rubs, gallops or murmurs. Lungs: decreased on R  Abdomen: soft, nontender, nondistended. No hepatosplenomegaly. No bruits or masses. Good bowel sounds. Extremities: no cyanosis, clubbing, rash, tr edema Neuro: intubated sedated   Telemetry   NSR 70-80s. No further SVT   Labs    CBC Recent Labs    09/19/20 0418 09/20/20 0442  WBC  10.8* 6.5  HGB 12.5* 12.3*  HCT 38.0* 38.4*  MCV 102.2* 103.8*  PLT 230 588   Basic Metabolic Panel Recent Labs    09/20/20 0442 09/20/20 0442 09/20/20 1326 09/21/20 0300  NA 138   < > 139 137  K 3.9   < > 3.5 3.7  CL 103   < > 102 103  CO2 24   < > 24 24  GLUCOSE 149*   < > 155* 145*  BUN 32*   < > 31* 35*  CREATININE 2.21*   < > 2.03* 1.79*  CALCIUM 7.9*   < > 7.8* 7.8*  MG 2.4  --   --  2.2  PHOS 4.4  --   --  3.8   < > = values in this interval not displayed.   Liver Function Tests Recent Labs    09/18/20 1230  AST 73*  ALT 59*  ALKPHOS 46  BILITOT 1.1  PROT 6.5  ALBUMIN 3.0*   No results for input(s): LIPASE, AMYLASE in the last 72 hours. Cardiac Enzymes No results for input(s): CKTOTAL, CKMB, CKMBINDEX, TROPONINI in the last 72 hours.  BNP: BNP (last 3 results) Recent Labs    09/17/20 0836  BNP 1,001.0*    ProBNP (last 3 results) No results for input(s): PROBNP in the last 8760 hours.   D-Dimer No results for input(s): DDIMER in the last 72 hours. Hemoglobin A1C  No results for input(s): HGBA1C in the last 72 hours. Fasting Lipid Panel Recent Labs    09/21/20 0300  TRIG 100   Thyroid Function Tests No results for input(s): TSH, T4TOTAL, T3FREE, THYROIDAB in the last 72 hours.  Invalid input(s): FREET3  Other results:   Imaging    No results found.   Medications:     Scheduled Medications: . chlorhexidine gluconate (MEDLINE KIT)  15 mL Mouth Rinse BID  . Chlorhexidine Gluconate Cloth  6 each Topical Daily  . docusate  100 mg Per Tube BID  . enoxaparin (LOVENOX) injection  60 mg Subcutaneous Q24H  . feeding supplement (PROSource TF)  90 mL Per Tube TID  . feeding supplement (VITAL HIGH PROTEIN)  1,000 mL Per Tube Q24H  . folic acid  1 mg Per Tube Daily  . influenza vac split quadrivalent PF  0.5 mL Intramuscular Tomorrow-1000  . insulin aspart  2-6 Units Subcutaneous Q4H  . ipratropium-albuterol  3 mL Nebulization Q6H  .  mouth rinse  15 mL Mouth Rinse 10 times per day  . multivitamin  15 mL Per Tube Daily  . pantoprazole (PROTONIX) IV  40 mg Intravenous Daily  . pneumococcal 23 valent vaccine  0.5 mL Intramuscular Tomorrow-1000  . polyethylene glycol  17 g Per Tube Daily  . sodium chloride flush  10-40 mL Intracatheter Q12H  . sodium chloride flush  3 mL Intravenous Q12H  . thiamine  100 mg Per Tube Daily    Infusions: . sodium chloride Stopped (09/21/20 0530)  . sodium chloride 12.5 mL/hr at 09/20/20 1900  . amiodarone 30 mg/hr (09/21/20 0600)  . fentaNYL infusion INTRAVENOUS 125 mcg/hr (09/21/20 0600)  . midazolam Stopped (09/18/20 0914)  . norepinephrine (LEVOPHED) Adult infusion 7 mcg/min (09/21/20 0600)  . piperacillin-tazobactam (ZOSYN)  IV 12.5 mL/hr at 09/21/20 0600  . propofol (DIPRIVAN) infusion 25.013 mcg/kg/min (09/21/20 0704)  . vancomycin Stopped (09/21/20 0228)  . vasopressin 0.03 Units/min (09/21/20 0600)    PRN Medications: sodium chloride, acetaminophen, fentaNYL, midazolam, ondansetron (ZOFRAN) IV, sodium chloride flush, sodium chloride flush     Assessment/Plan   1. A/C Systolic HF -->Cardiogenic Shock + Septic Shock  - ECHO EF 20% with moderate RV failure. - Remains on NE 7, VP 0.03. Co-ox 51% markedly volume overloaded.  - Add milrinone 0.125 and wean VP as tolerated now that sepsis improving - CVP 22. Restart IV diuretics today. Give 80 mg x 1 then lasix gtt - No BB with shock - No spiro/arin with AKI - Once stabilized he will need cath to further evaluate  - follow CVPs and BMP    2. Aspiration PNA with septic shock - sputum cx + MSSA - new effusion on CXR -> CCM to u/s +/- chest tube - switch abx to cefazolin  3. Acute Respiratory Failure - Intubated 10/5. CCM following. - treat aspiration PNA as above - diurese - appreciate PCCM assistance   4. PAF - Has history of A flutter back in 2019. Off xarelto due cost for 1 year.  - maintaining NSR. Monitor on NE   - If he goes in A flutter will add anticoagulation.   5. SVT - S/p emergent cardioversion 10/5 - remains in NSR currently. No recurrence at this point  - Continue amio drip 30 mg per hour while on inotropes   5. AKI - Developed worsening renal function in the setting of shock and ATN - Creatinine trending back down 1.5>1.9>2.6>3.0>3.4>2.66>>2.21 > 1.79 - Nonoliguric - resume IV  diuretics today  - monitor closely   6.  ETOH  - Watch for withdrawal.  - Place on CIWA  7.  Tobacco Abuse   CRITICAL CARE Performed by: Glori Bickers  Total critical care time: 35 minutes  Critical care time was exclusive of separately billable procedures and treating other patients.  Critical care was necessary to treat or prevent imminent or life-threatening deterioration.  Critical care was time spent personally by me (independent of midlevel providers or residents) on the following activities: development of treatment plan with patient and/or surrogate as well as nursing, discussions with consultants, evaluation of patient's response to treatment, examination of patient, obtaining history from patient or surrogate, ordering and performing treatments and interventions, ordering and review of laboratory studies, ordering and review of radiographic studies, pulse oximetry and re-evaluation of patient's condition.   Length of Stay: Viera [place], MD  09/21/2020, 10:16 AM  Advanced Heart Failure Team Pager [phone] (M-F; 7a - 4p)  Please contact [facility] for night-coverage after hours (4p -7a ) and weekends on [email or web address]

## 2020-09-21 NOTE — Progress Notes (Signed)
NAME:  Caelan Branden, MRN:  [id number], DOB:  08/03/1981, LOS: 4 ADMISSION DATE:  09/17/2020, CONSULTATION DATE:  10/05 REFERRING MD:  Dr Gala Romney CHIEF COMPLAINT:  SVT   Brief History    Austine Wiedeman is a 39 yr old male with PMH EOTH abuse, A flutter, tobacco abuse, chronic systolic heart failure admitted on 10/5 for SVT. Now in cardiogenic shock.  History of present illness    Mr. Pickering is a 39 y/o gentleman with a history of cardiomyopathy (unknown if ischemic), permanent Afib not on AC, and chronic HFrEF who has been off GDMT x 1 year and presented to outpatient cardiology clinic today with acute decompensated heart failure.  He was direct admitted to the hospital, subsequently developed SVT with HR as high as 190 who required emergent DCCV. He was intubated for acute respiratory failure due to cardiogenic pulmonary edema and need for DCCV. He has been started on inotropes to assist with diuresis.   Past Medical History  ETOH abuse A- flutter, no on AC Tobacco abuse HFrEF HTN   Significant Hospital Events   10/5: Admitted, cardioversion, intubated  Consults:  HF   PCCM  Procedures:  10/05 Intubation, CVC insertion, arterial line   Significant Diagnostic Tests:   Echo in 2019: EF 30-35%, systolic functionmoderately to severely reduced, Diffuse hypokinesis. Right ventricular systolic presure 53 mmHg, suggesting at least moderate pulmonary hypertension.  Echo pending for 10/5: LVEF 40-45% with global hypokinesis, G2DD  CXR: ETT tip terminates 3.9 cm above the carina. A RIGHT IJ CVC tip terminates over the superior cavoatrial junction. Right sided pleural effusion. Infection Vs pulmonary edema     Micro Data:  COVID neg  Trach aspirate 10/7> MSSA Blood culture 7/7>  Antimicrobials:  vanc 10/7> Zosyn 10/7>  Interim history/subjective:  No acute events. Remains on vent.  Objective   Blood pressure 110/78, pulse 79, temperature 99.2 F (37.3 C), resp. rate 16, height  6' (1.829 m), weight 127.6 kg, SpO2 94 %. CVP:  [15 mmHg-34 mmHg] 25 mmHg  Vent Mode: PRVC FiO2 (%):  [35 %-45 %] 40 % Set Rate:  [16 bmp] 16 bmp Vt Set:  [620 mL] 620 mL PEEP:  [5 cmH20] 5 cmH20 Plateau Pressure:  [25 cmH20-26 cmH20] 26 cmH20   Intake/Output Summary (Last 24 hours) at 09/21/2020 1026 Last data filed at 09/21/2020 1000 Gross per 24 hour  Intake 2426.14 ml  Output 2950 ml  Net -523.86 ml   Filed Weights   09/19/20 0500 09/20/20 0500 09/21/20 0500  Weight: 124.9 kg 126.4 kg 127.6 kg    Examination: General: critically ill appearing man laying in bed in NAD, intubated, sedated HEENT: [place]/AT, eyes anicteric Lungs: rhonchi bilaterally, improved with suctioning. Pplat<30 Cardiovascular: RRR Abdomen: soft, NT, ND Extremities: no c/c, some edema Neuro: +cough reflex, closes eyes when coughing Skin:  Mild diaphoresis  CXR: enlarging R opacity, concerning for effusion, ETT in place  Bedside US: hepatized lung in R base  CT chest w/o contrast: consolidated entire RLL with air bronchograms, minimal surrounding pleural effusion, consolidation in left base. Secretions in distal trachea.  Resolved Hospital Problem list   N/a  Assessment & Plan:    Acute hypoxic respiratory failure due to cardiogenic pulmonary edema and acute staph aureus pneumonia -Con't LTVV,  -VAP prevention protocol -Daily SAT and SBT when appropriate. Ongoing O2 requirements and secretions preclude extubation. -adding CPT Q4h -Propofol or Versed are preferred sedatives with his history of alcohol abuse. -cefazolin  Septic shock -de-escalate antibiotics  to ancef to complete 7 day course -pressors and inotropes to maintain MAP >65  Acute on chronic HFrEF; cardiogenic shock improved based on co-ox SVT, s/p DCCV -Continue amiodarone infusion  -Continue norepinephrine to maintain MAP greater than 65. -adding back milrinone; wean off vasopressin (serial co-ox:  74> 75> 81>60 off  milrinone) -Strict I/Os -restart Lasix drip. Goal net negative 2L over next 24 h -Unable to tolerate guideline directed heart failure therapy due to shock -Eventually planning for ischemic work-up when stable  AKI; cardiorenal syndrome plus likely sepsis--improving -Continue to monitor -Strict I's/O -Renally dose meds and avoid nephrotoxic medications -increase diuresis   Elevated transaminases due to congestive hepatopathy, possibly due to ETOH use -Continue to monitor periodically  Acute anemia likely due to critical illness, chronic alcohol use -Transfuse for hemoglobin less than 7 or hemodynamically significant bleeding -Continue to monitor  Hx of EOTH abuse -Supplemental vitamins and nutrition. -Midazolam or propofol would prevent alcohol withdrawal syndrome.  Hopefully will not have to worry about DTs given the duration that he has been off of alcohol on these agents.  Tobacco abuse -Nicotine patch -Encourage smoking cessation when appropriate  Best practice:   Diet: NPO  Pain/Anxiety/Delirium protocol (if indicated): Fentanyl, propofol, versed   VAP protocol (if indicated): yes DVT prophylaxis: Lovenox  GI prophylaxis: Protonix  Glucose control:  Mobility: bed rest Code Status: FULL  Family Communication: parents updated at bedside Disposition: ICU   Labs   CBC: Recent Labs  Lab 09/17/20 0904 09/17/20 1212 09/17/20 1310 09/17/20 1310 09/17/20 1403 09/18/20 0424 09/18/20 0753 09/19/20 0418 09/20/20 0442  WBC 5.0  --  5.9  --   --  5.6  --  10.8* 6.5  HGB 13.3   < > 12.8*   < > 13.6 12.0* 12.6* 12.5* 12.3*  HCT 40.5   < > 39.5   < > 40.0 36.2* 37.0* 38.0* 38.4*  MCV 102.0*  --  100.3*  --   --  99.2  --  102.2* 103.8*  PLT 247  --  275  --   --  210  --  230 216   < > = values in this interval not displayed.    Basic Metabolic Panel: Recent Labs  Lab 09/17/20 1310 09/17/20 1503 09/18/20 0424 09/18/20 0753 09/19/20 0418 09/19/20 1622  09/20/20 0442 09/20/20 1326 09/21/20 0300  NA   < >  --  138   < > 138 138 138 139 137  K   < >  --  3.1*   < > 4.3 3.9 3.9 3.5 3.7  CL   < >  --  103   < > 104 105 103 102 103  CO2   < >  --  23   < > 22 23 24 24 24   GLUCOSE   < >  --  108*   < > 135* 135* 149* 155* 145*  BUN   < >  --  22*   < > 27* 26* 32* 31* 35*  CREATININE   < >  --  2.66*   < > 3.36* 2.66* 2.21* 2.03* 1.79*  CALCIUM   < >  --  8.3*   < > 7.9* 7.5* 7.9* 7.8* 7.8*  MG  --  1.8 1.7  --  2.2  --  2.4  --  2.2  PHOS  --   --   --   --  5.7*  --  4.4  --  3.8   < > =  values in this interval not displayed.   GFR: Estimated Creatinine Clearance: 76.5 mL/min (A) (by C-G formula based on SCr of 1.79 mg/dL (H)). Recent Labs  Lab 09/17/20 1310 09/17/20 1503 09/18/20 0424 09/19/20 0418 09/19/20 0839 09/20/20 0442 09/21/20 0300  PROCALCITON  --   --   --   --  1.98 1.54 1.38  WBC 5.9  --  5.6 10.8*  --  6.5  --   LATICACIDVEN 2.8* 1.8  --   --   --   --   --     Liver Function Tests: Recent Labs  Lab 09/17/20 0904 09/17/20 1310 09/18/20 1230  AST 71* 77* 73*  ALT 72* 71* 59*  ALKPHOS 56 57 46  BILITOT 1.2 1.4* 1.1  PROT 7.4 7.1 6.5  ALBUMIN 3.4* 3.3* 3.0*   No results for input(s): LIPASE, AMYLASE in the last 168 hours. No results for input(s): AMMONIA in the last 168 hours.  ABG    Component Value Date/Time   PHART 7.428 09/18/2020 0753   PCO2ART 36.6 09/18/2020 0753   PO2ART 110 (H) 09/18/2020 0753   HCO3 24.1 09/18/2020 0753   TCO2 25 09/18/2020 0753   ACIDBASEDEF 1.0 09/17/2020 1403   O2SAT 50.9 09/21/2020 0300     Coagulation Profile: No results for input(s): INR, PROTIME in the last 168 hours.  Cardiac Enzymes: No results for input(s): CKTOTAL, CKMB, CKMBINDEX, TROPONINI in the last 168 hours.  HbA1C: Hgb A1c MFr Bld  Date/Time Value Ref Range Status  10/12/2018 03:35 PM 5.8 (H) 4.8 - 5.6 % Final    Comment:             Prediabetes: 5.7 - 6.4          Diabetes: >6.4           Glycemic control for adults with diabetes: <7.0     CBG: Recent Labs  Lab 09/20/20 1941 09/20/20 2334 09/21/20 0340 09/21/20 0420 09/21/20 0742  GLUCAP 114* 119* 130* 121* 104*   This patient is critically ill with multiple organ system failure which requires frequent high complexity decision making, assessment, support, evaluation, and titration of therapies. This was completed through the application of advanced monitoring technologies and extensive interpretation of multiple databases. During this encounter critical care time was devoted to patient care services described in this note for 35 minutes.  Steffanie Dunn, DO 09/21/20 1:12 PM [facility]

## 2020-09-22 DIAGNOSIS — J690 Pneumonitis due to inhalation of food and vomit: Secondary | ICD-10-CM

## 2020-09-22 LAB — BASIC METABOLIC PANEL
Anion gap: 14 (ref 5–15)
Anion gap: 11 (ref 5–15)
BUN: 38 mg/dL — ABNORMAL HIGH (ref 6–20)
BUN: 39 mg/dL — ABNORMAL HIGH (ref 6–20)
CO2: 31 mmol/L (ref 22–32)
CO2: 32 mmol/L (ref 22–32)
Calcium: 7.9 mg/dL — ABNORMAL LOW (ref 8.9–10.3)
Calcium: 8 mg/dL — ABNORMAL LOW (ref 8.9–10.3)
Chloride: 98 mmol/L (ref 98–111)
Chloride: 101 mmol/L (ref 98–111)
Creatinine, Ser: 1.64 mg/dL — ABNORMAL HIGH (ref 0.61–1.24)
Creatinine, Ser: 1.5 mg/dL — ABNORMAL HIGH (ref 0.61–1.24)
GFR, Estimated: 52 mL/min — ABNORMAL LOW (ref >60)
GFR, Estimated: 58 mL/min — ABNORMAL LOW (ref >60)
Glucose, Bld: 155 mg/dL — ABNORMAL HIGH (ref 70–99)
Glucose, Bld: 138 mg/dL — ABNORMAL HIGH (ref 70–99)
Potassium: 3 mmol/L — ABNORMAL LOW (ref 3.5–5.1)
Potassium: 3.1 mmol/L — ABNORMAL LOW (ref 3.5–5.1)
Sodium: 143 mmol/L (ref 135–145)
Sodium: 144 mmol/L (ref 135–145)

## 2020-09-22 LAB — COOXEMETRY PANEL
Carboxyhemoglobin: 0.9 % (ref 0.5–1.5)
Carboxyhemoglobin: 1.2 % (ref 0.5–1.5)
Methemoglobin: 0.7 % (ref 0.0–1.5)
Methemoglobin: 0.9 % (ref 0.0–1.5)
O2 Saturation: 66.5 %
O2 Saturation: 92 %
Total hemoglobin: 14.2 g/dL (ref 12.0–16.0)
Total hemoglobin: 11.8 g/dL — ABNORMAL LOW (ref 12.0–16.0)

## 2020-09-22 LAB — TRIGLYCERIDES: Triglycerides: 127 mg/dL (ref <150)

## 2020-09-22 LAB — GLUCOSE, CAPILLARY
Glucose-Capillary: 130 mg/dL — ABNORMAL HIGH (ref 70–99)
Glucose-Capillary: 160 mg/dL — ABNORMAL HIGH (ref 70–99)
Glucose-Capillary: 118 mg/dL — ABNORMAL HIGH (ref 70–99)
Glucose-Capillary: 120 mg/dL — ABNORMAL HIGH (ref 70–99)
Glucose-Capillary: 127 mg/dL — ABNORMAL HIGH (ref 70–99)
Glucose-Capillary: 125 mg/dL — ABNORMAL HIGH (ref 70–99)

## 2020-09-22 LAB — PHOSPHORUS: Phosphorus: 3.3 mg/dL (ref 2.5–4.6)

## 2020-09-22 LAB — MAGNESIUM: Magnesium: 1.8 mg/dL (ref 1.7–2.4)

## 2020-09-22 MED ORDER — POTASSIUM CHLORIDE 20 MEQ PO PACK
40.0000 meq | PACK | Freq: Once | ORAL | Status: AC
  Administered 2020-09-22: 40 meq via ORAL
  Filled 2020-09-22: qty 2

## 2020-09-22 MED ORDER — POTASSIUM CHLORIDE CRYS ER 20 MEQ PO TBCR: 40.0000 meq | EXTENDED_RELEASE_TABLET | Freq: Once | ORAL | Status: DC

## 2020-09-22 MED ORDER — POTASSIUM CHLORIDE 10 MEQ/50ML IV SOLN
10.0000 meq | INTRAVENOUS | Status: AC
  Administered 2020-09-22 (×4): 10 meq via INTRAVENOUS
  Filled 2020-09-22 (×4): qty 50

## 2020-09-22 MED ORDER — SPIRONOLACTONE 25 MG PO TABS
25.0000 mg | ORAL_TABLET | Freq: Every day | ORAL | Status: DC
  Administered 2020-09-22: 25 mg via ORAL
  Filled 2020-09-22: qty 1

## 2020-09-22 MED ORDER — POTASSIUM CHLORIDE 20 MEQ/15ML (10%) PO SOLN
20.0000 meq | ORAL | Status: DC
  Administered 2020-09-22: 20 meq
  Filled 2020-09-22: qty 15

## 2020-09-22 MED ORDER — MAGNESIUM SULFATE 2 GM/50ML IV SOLN
2.0000 g | Freq: Once | INTRAVENOUS | Status: AC
  Administered 2020-09-22: 2 g via INTRAVENOUS
  Filled 2020-09-22: qty 50

## 2020-09-22 MED ORDER — POTASSIUM CHLORIDE 20 MEQ PO PACK
40.0000 meq | PACK | Freq: Four times a day (QID) | ORAL | Status: AC
  Administered 2020-09-22 (×2): 40 meq via ORAL
  Filled 2020-09-22 (×2): qty 2

## 2020-09-22 MED ORDER — POTASSIUM CHLORIDE 10 MEQ/50ML IV SOLN
10.0000 meq | INTRAVENOUS | Status: AC
  Administered 2020-09-22 (×3): 10 meq via INTRAVENOUS
  Filled 2020-09-22 (×4): qty 50

## 2020-09-22 NOTE — Progress Notes (Signed)
NAME:  Antonio Tapia, MRN:  [id number], DOB:  1981/05/14, LOS: 5 ADMISSION DATE:  09/17/2020, CONSULTATION DATE:  10/05 REFERRING MD:  Dr Gala Romney CHIEF COMPLAINT:  SVT   Brief History    Antonio Tapia is a 39 yr old male with PMH EOTH abuse, A flutter, tobacco abuse, chronic systolic heart failure admitted on 10/5 for SVT. Now in cardiogenic shock.  History of present illness    Antonio Tapia is a 40 y/o gentleman with a history of cardiomyopathy (unknown if ischemic), permanent Afib not on AC, and chronic HFrEF who has been off GDMT x 1 year and presented to outpatient cardiology clinic today with acute decompensated heart failure.  He was direct admitted to the hospital, subsequently developed SVT with HR as high as 190 who required emergent DCCV. He was intubated for acute respiratory failure due to cardiogenic pulmonary edema and need for DCCV. He has been started on inotropes to assist with diuresis.   Past Medical History  ETOH abuse A- flutter, no on AC Tobacco abuse HFrEF HTN   Significant Hospital Events   10/5: Admitted, cardioversion, intubated  Consults:  HF   PCCM  Procedures:  10/05 Intubation, CVC insertion, arterial line   Significant Diagnostic Tests:   Echo in 2019: EF 30-35%, systolic functionmoderately to severely reduced, Diffuse hypokinesis. Right ventricular systolic presure 53 mmHg, suggesting at least moderate pulmonary hypertension.  Echo pending for 10/5: LVEF 40-45% with global hypokinesis, G2DD  CXR: ETT tip terminates 3.9 cm above the carina. A RIGHT IJ CVC tip terminates over the superior cavoatrial junction. Right sided pleural effusion. Infection Vs pulmonary edema     Micro Data:  COVID neg  Trach aspirate 10/7> MSSA Blood culture 7/7>  Antimicrobials:  vanc 10/7> Zosyn 10/7>  Interim history/subjective:  Afebrile overnight.  Objective   Blood pressure 128/81, pulse 75, temperature 98.7 F (37.1 C), temperature source Oral, resp. rate  16, height 6' (1.829 m), weight 122.8 kg, SpO2 100 %. CVP:  [6 mmHg-27 mmHg] 6 mmHg  Vent Mode: PRVC FiO2 (%):  [40 %] 40 % Set Rate:  [16 bmp] 16 bmp Vt Set:  [620 mL] 620 mL PEEP:  [5 cmH20] 5 cmH20 Plateau Pressure:  [22 cmH20-26 cmH20] 22 cmH20   Intake/Output Summary (Last 24 hours) at 09/22/2020 0703 Last data filed at 09/22/2020 0600 Gross per 24 hour  Intake 3217.36 ml  Output 6750 ml  Net -3532.64 ml   Filed Weights   09/21/20 0500 09/22/20 0100 09/22/20 0600  Weight: 127.6 kg 124.7 kg 122.8 kg    Examination: General: Critically ill-appearing man intubated, sedated HEENT: [place]/AT, eyes anicteric Lungs: Rhonchi bilaterally, copious tan secretions. Cardiovascular: Regular rate and rhythm, no murmurs Abdomen: Soft, nontender, nondistended Extremities: No clubbing or cyanosis, minimal lower extremity edema Neuro: RASS -4, strong cough reflex.   Skin: Warm and dry   Resolved Hospital Problem list   N/a  Assessment & Plan:    Acute hypoxic respiratory failure due to cardiogenic pulmonary edema and acute staph aureus pneumonia -Con't LTVV, 4-8 cc/kg ideal body weight with goal plateau's and 30 driving pressure less than 15 -VAP prevention protocol -Daily SAT and SBT when appropriate.  Copious secretions are likely to limit successful extubation at this point. -Continue CPT Q4h -Propofol or Versed are preferred sedatives with his history of alcohol abuse. -cefazolin to complete 7-day course of antibiotics  Mixed cardiogenic and septic shock -Cefazolin to complete 7 day course -pressors and inotropes to maintain MAP >65  Acute on chronic HFrEF; cardiogenic shock improved based on co-ox SVT, s/p DCCV -Continue amiodarone infusion  -Continue  Ne & vasopressin to maintain MAP greater than 65. -adding back milrinone; wean off vasopressin (serial co-ox:  74> 75> 81>60 off milrinone) -Strict I/Os -Restart Lasix drip. Goal net negative 2L over next 24h  -Unable to  tolerate guideline directed heart failure therapy due to shock -Eventually planning for ischemic work-up when stable  AKI; cardiorenal syndrome plus likely sepsis--improving -Continue to monitor -Strict I's/O -Renally dose meds and avoid nephrotoxic medications -Increase diuresis   Elevated transaminases due to congestive hepatopathy, possibly due to ETOH use -Continue to monitor periodically  Acute anemia likely due to critical illness, chronic alcohol use -Transfuse for hemoglobin less than 7 or hemodynamically significant bleeding -Continue to monitor  Hx of EOTH abuse -Supplemental vitamins and nutrition. -Midazolam or propofol would prevent alcohol withdrawal syndrome.  Hopefully will not have to worry about DTs given the duration that he has been off of alcohol on these agents.  Tobacco abuse -Nicotine patch -Encourage smoking cessation when appropriate  Best practice:   Diet: NPO  Pain/Anxiety/Delirium protocol (if indicated): Fentanyl, propofol, versed   VAP protocol (if indicated): yes DVT prophylaxis: Lovenox  GI prophylaxis: Protonix  Glucose control:  Mobility: bed rest Code Status: FULL  Family Communication:   Disposition: ICU   Labs   CBC: Recent Labs  Lab 09/17/20 0904 09/17/20 1212 09/17/20 1310 09/17/20 1310 09/17/20 1403 09/18/20 0424 09/18/20 0753 09/19/20 0418 09/20/20 0442  WBC 5.0  --  5.9  --   --  5.6  --  10.8* 6.5  HGB 13.3   < > 12.8*   < > 13.6 12.0* 12.6* 12.5* 12.3*  HCT 40.5   < > 39.5   < > 40.0 36.2* 37.0* 38.0* 38.4*  MCV 102.0*  --  100.3*  --   --  99.2  --  102.2* 103.8*  PLT 247  --  275  --   --  210  --  230 216   < > = values in this interval not displayed.    Basic Metabolic Panel: Recent Labs  Lab 09/18/20 0424 09/18/20 0753 09/19/20 0418 09/19/20 0418 09/19/20 1622 09/20/20 0442 09/20/20 1326 09/21/20 0300 09/22/20 0433  NA 138   < > 138   < > 138 138 139 137 143  K 3.1*   < > 4.3   < > 3.9 3.9 3.5 3.7  3.0*  CL 103   < > 104   < > 105 103 102 103 98  CO2 23   < > 22   < > 23 24 24 24 31   GLUCOSE 108*   < > 135*   < > 135* 149* 155* 145* 155*  BUN 22*   < > 27*   < > 26* 32* 31* 35* 38*  CREATININE 2.66*   < > 3.36*   < > 2.66* 2.21* 2.03* 1.79* 1.64*  CALCIUM 8.3*   < > 7.9*   < > 7.5* 7.9* 7.8* 7.8* 7.9*  MG 1.7  --  2.2  --   --  2.4  --  2.2 1.8  PHOS  --   --  5.7*  --   --  4.4  --  3.8 3.3   < > = values in this interval not displayed.   GFR: Estimated Creatinine Clearance: 81.9 mL/min (A) (by C-G formula based on SCr of 1.64 mg/dL (H)). Recent Labs  Lab  09/17/20 1310 09/17/20 1503 09/18/20 0424 09/19/20 0418 09/19/20 0839 09/20/20 0442 09/21/20 0300  PROCALCITON  --   --   --   --  1.98 1.54 1.38  WBC 5.9  --  5.6 10.8*  --  6.5  --   LATICACIDVEN 2.8* 1.8  --   --   --   --   --     Liver Function Tests: Recent Labs  Lab 09/17/20 0904 09/17/20 1310 09/18/20 1230  AST 71* 77* 73*  ALT 72* 71* 59*  ALKPHOS 56 57 46  BILITOT 1.2 1.4* 1.1  PROT 7.4 7.1 6.5  ALBUMIN 3.4* 3.3* 3.0*   No results for input(s): LIPASE, AMYLASE in the last 168 hours. No results for input(s): AMMONIA in the last 168 hours.  ABG    Component Value Date/Time   PHART 7.428 09/18/2020 0753   PCO2ART 36.6 09/18/2020 0753   PO2ART 110 (H) 09/18/2020 0753   HCO3 24.1 09/18/2020 0753   TCO2 25 09/18/2020 0753   ACIDBASEDEF 1.0 09/17/2020 1403   O2SAT 66.5 09/22/2020 0433     Coagulation Profile: No results for input(s): INR, PROTIME in the last 168 hours.  Cardiac Enzymes: No results for input(s): CKTOTAL, CKMB, CKMBINDEX, TROPONINI in the last 168 hours.  HbA1C: Hgb A1c MFr Bld  Date/Time Value Ref Range Status  10/12/2018 03:35 PM 5.8 (H) 4.8 - 5.6 % Final    Comment:             Prediabetes: 5.7 - 6.4          Diabetes: >6.4          Glycemic control for adults with diabetes: <7.0     CBG: Recent Labs  Lab 09/21/20 1144 09/21/20 1551 09/21/20 2007 09/21/20 2353  09/22/20 0431  GLUCAP 124* 156* 98 103* 130*   This patient is critically ill with multiple organ system failure which requires frequent high complexity decision making, assessment, support, evaluation, and titration of therapies. This was completed through the application of advanced monitoring technologies and extensive interpretation of multiple databases. During this encounter critical care time was devoted to patient care services described in this note for 32 minutes.  Steffanie Dunn, DO 09/22/20 5:32 PM [facility]

## 2020-09-22 NOTE — Progress Notes (Signed)
Advanced Heart Failure Rounding Note  PCP-Cardiologist: No primary care provider on file.   Subjective:    - 10/5 Admitted with A/C Sysolic HF-->Decompensated d/t SVT.  Underwent Emergent DC-CV and intubation. Placed on Norepi + milrinone+ amio drip.  - 10/7 developed component of septic shock, CXR w/ RLL infiltrate (likely aspiration). PTC 1.98. Vanc + Zosyn added. VP + NE.  - 10/9 sputum growing MSSA on Ancef - CXR with large R effusion today. Chest CT with consolidation. No effusion   Remains on vent. Sedated. Copious secretions. NE weaned off. Now on VP 0.03 and milrinone 0.125.  Co-ox 67%  Creatinine improving. Diuresing well on IV lasix  CVP 14  Objective:   Weight Range: 122.8 kg Body mass index is 36.72 kg/m.   Vital Signs:   Temp:  [98.3 F (36.8 C)-99.2 F (37.3 C)] 99.2 F (37.3 C) (10/10 1558) Pulse Rate:  [73-83] 79 (10/10 1548) Resp:  [16-19] 16 (10/10 1548) BP: (93-129)/(59-97) 93/65 (10/10 1400) SpO2:  [99 %-100 %] 99 % (10/10 1548) Arterial Line BP: (68-150)/(55-82) 109/59 (10/10 1400) FiO2 (%):  [30 %-40 %] 30 % (10/10 1551) Weight:  [122.8 kg-124.7 kg] 122.8 kg (10/10 0600) Last BM Date:  (PTA)  Weight change: Filed Weights   09/21/20 0500 09/22/20 0100 09/22/20 0600  Weight: 127.6 kg 124.7 kg 122.8 kg    Intake/Output:   Intake/Output Summary (Last 24 hours) at 09/22/2020 1629 Last data filed at 09/22/2020 1400 Gross per 24 hour  Intake 3593.76 ml  Output 5400 ml  Net -1806.24 ml      Physical Exam   General: Sedated on vent.  HEENT: +ETT Neck: supple. RIJ central line. Carotids 2+ bilat; no bruits. No lymphadenopathy or thryomegaly appreciated. Cor: PMI nondisplaced. Regular rate & rhythm. No rubs, gallops or murmurs. Lungs: decreased at basese Abdomen: soft, nontender, nondistended. No hepatosplenomegaly. No bruits or masses. Good bowel sounds. Extremities: no cyanosis, clubbing, rash, tr edema Neuro: intubated  sedated  Telemetry   NSR 70-80s. No further SVT   Labs    CBC Recent Labs    09/20/20 0442  WBC 6.5  HGB 12.3*  HCT 38.4*  MCV 103.8*  PLT 982   Basic Metabolic Panel Recent Labs    09/21/20 0300 09/22/20 0433  NA 137 143  K 3.7 3.0*  CL 103 98  CO2 24 31  GLUCOSE 145* 155*  BUN 35* 38*  CREATININE 1.79* 1.64*  CALCIUM 7.8* 7.9*  MG 2.2 1.8  PHOS 3.8 3.3   Liver Function Tests No results for input(s): AST, ALT, ALKPHOS, BILITOT, PROT, ALBUMIN in the last 72 hours. No results for input(s): LIPASE, AMYLASE in the last 72 hours. Cardiac Enzymes No results for input(s): CKTOTAL, CKMB, CKMBINDEX, TROPONINI in the last 72 hours.  BNP: BNP (last 3 results) Recent Labs    09/17/20 0836  BNP 1,001.0*    ProBNP (last 3 results) No results for input(s): PROBNP in the last 8760 hours.   D-Dimer No results for input(s): DDIMER in the last 72 hours. Hemoglobin A1C No results for input(s): HGBA1C in the last 72 hours. Fasting Lipid Panel Recent Labs    09/22/20 0433  TRIG 127   Thyroid Function Tests No results for input(s): TSH, T4TOTAL, T3FREE, THYROIDAB in the last 72 hours.  Invalid input(s): FREET3  Other results:   Imaging    No results found.   Medications:     Scheduled Medications: . chlorhexidine gluconate (MEDLINE KIT)  15 mL  Mouth Rinse BID  . Chlorhexidine Gluconate Cloth  6 each Topical Daily  . docusate  100 mg Per Tube BID  . enoxaparin (LOVENOX) injection  60 mg Subcutaneous Q24H  . feeding supplement (PROSource TF)  90 mL Per Tube TID  . feeding supplement (VITAL HIGH PROTEIN)  1,000 mL Per Tube Q24H  . folic acid  1 mg Per Tube Daily  . influenza vac split quadrivalent PF  0.5 mL Intramuscular Tomorrow-1000  . insulin aspart  2-6 Units Subcutaneous Q4H  . ipratropium-albuterol  3 mL Nebulization Q6H  . mouth rinse  15 mL Mouth Rinse 10 times per day  . multivitamin  15 mL Per Tube Daily  . pantoprazole (PROTONIX) IV  40  mg Intravenous Daily  . pneumococcal 23 valent vaccine  0.5 mL Intramuscular Tomorrow-1000  . polyethylene glycol  17 g Per Tube Daily  . potassium chloride  40 mEq Oral Q6H  . sodium chloride flush  10-40 mL Intracatheter Q12H  . sodium chloride flush  3 mL Intravenous Q12H  . thiamine  100 mg Per Tube Daily    Infusions: . sodium chloride Stopped (09/22/20 1347)  . sodium chloride 12.5 mL/hr at 09/20/20 1900  . amiodarone 30 mg/hr (09/22/20 1400)  .  ceFAZolin (ANCEF) IV 200 mL/hr at 09/22/20 1400  . fentaNYL infusion INTRAVENOUS 125 mcg/hr (09/22/20 1400)  . furosemide (LASIX) infusion 15 mg/hr (09/22/20 1400)  . midazolam Stopped (09/18/20 0914)  . milrinone 0.125 mcg/kg/min (09/22/20 1400)  . norepinephrine (LEVOPHED) Adult infusion Stopped (09/22/20 5449)  . propofol (DIPRIVAN) infusion 25 mcg/kg/min (09/22/20 1400)  . vasopressin 0.03 Units/min (09/22/20 1406)    PRN Medications: sodium chloride, acetaminophen, fentaNYL, midazolam, ondansetron (ZOFRAN) IV, sodium chloride flush, sodium chloride flush     Assessment/Plan   1. A/C Systolic HF -->Cardiogenic Shock + Septic Shock  - ECHO EF 20% with moderate RV failure. - Co-ox now 67% Off NE. On milrinone 0.125 and VP 0.03. Can restart NE as needed to support diuresis - Continue lasix gtt at 15  - No BB with shock - No spiro/arin with AKI - Once stabilized he will need cath to further evaluate  - follow CVPs and BMP    2. Aspiration PNA with septic shock - sputum cx + MSSA - CT chest 10/9 with dense RML/RLL effusion. No effusion - continue cefazolin  3. Acute Respiratory Failure - Intubated 10/5. CCM following. - treat aspiration PNA as above - diurese - appreciate PCCM assistance  - has heavy secretions. Likely not ready for extubation yet   4. PAF - Has history of A flutter back in 2019. Off xarelto due cost for 1 year.  - maintaining NSR.    5. SVT - S/p emergent cardioversion 10/5 - remains in NSR  currently. No recurrence at this point  - Continue amio drip 30 mg per hour while on inotropes   5. AKI - Developed worsening renal function in the setting of shock and ATN - Creatinine improving with inotropic support  - Continue lasix gtt. Check electrolytes   6.  ETOH  - Watch for withdrawal.  - Place on CIWA  7.  Tobacco Abuse   CRITICAL CARE Performed by: Glori Bickers  Total critical care time: 35 minutes  Critical care time was exclusive of separately billable procedures and treating other patients.  Critical care was necessary to treat or prevent imminent or life-threatening deterioration.  Critical care was time spent personally by me (independent of midlevel providers or  residents) on the following activities: development of treatment plan with patient and/or surrogate as well as nursing, discussions with consultants, evaluation of patient's response to treatment, examination of patient, obtaining history from patient or surrogate, ordering and performing treatments and interventions, ordering and review of laboratory studies, ordering and review of radiographic studies, pulse oximetry and re-evaluation of patient's condition.   Length of Stay: [place], MD  09/22/2020, 4:29 PM  Advanced Heart Failure Team Pager [phone] (M-F; 7a - 4p)  Please contact [facility] for night-coverage after hours (4p -7a ) and weekends on [email or web address]

## 2020-09-22 NOTE — Progress Notes (Signed)
Pharmacy Electrolyte Replacement  Recent Labs:  Recent Labs    09/22/20 0433 09/22/20 0433 09/22/20 1720  K 3.0*   < > 3.1*  MG 1.8  --   --   PHOS 3.3  --   --   CREATININE 1.64*   < > 1.50*   < > = values in this interval not displayed.    K= 3.1 (/p 100 po and 30 IV. Last po dose was 5:30pm. Lab was at 5:20pm)    Plan:  -kdur po and IV q1h x4 doses -BMET in am  Harland German, PharmD Clinical Pharmacist **Pharmacist phone directory can now be found on [email or web address] ([email or web address]).  Listed under [facility].

## 2020-09-22 NOTE — Progress Notes (Signed)
Spoke to patients girlfriend, Publishing copy. She will be planning on visiting in the AM.

## 2020-09-23 ENCOUNTER — Inpatient Hospital Stay (HOSPITAL_COMMUNITY): Payer: Self-pay

## 2020-09-23 DIAGNOSIS — J9601 Acute respiratory failure with hypoxia: Secondary | ICD-10-CM

## 2020-09-23 LAB — COOXEMETRY PANEL
Carboxyhemoglobin: 0.9 % (ref 0.5–1.5)
Carboxyhemoglobin: 1.1 % (ref 0.5–1.5)
Methemoglobin: 0.8 % (ref 0.0–1.5)
Methemoglobin: 1 % (ref 0.0–1.5)
O2 Saturation: 50.7 %
O2 Saturation: 76 %
Total hemoglobin: 12.1 g/dL (ref 12.0–16.0)
Total hemoglobin: 11.8 g/dL — ABNORMAL LOW (ref 12.0–16.0)

## 2020-09-23 LAB — BASIC METABOLIC PANEL
Anion gap: 14 (ref 5–15)
BUN: 45 mg/dL — ABNORMAL HIGH (ref 6–20)
CO2: 30 mmol/L (ref 22–32)
Calcium: 8.6 mg/dL — ABNORMAL LOW (ref 8.9–10.3)
Chloride: 100 mmol/L (ref 98–111)
Creatinine, Ser: 1.65 mg/dL — ABNORMAL HIGH (ref 0.61–1.24)
GFR, Estimated: 52 mL/min — ABNORMAL LOW (ref >60)
Glucose, Bld: 129 mg/dL — ABNORMAL HIGH (ref 70–99)
Potassium: 4 mmol/L (ref 3.5–5.1)
Sodium: 144 mmol/L (ref 135–145)

## 2020-09-23 LAB — PROCALCITONIN: Procalcitonin: 0.9 ng/mL

## 2020-09-23 LAB — CBC
HCT: 37 % — ABNORMAL LOW (ref 39.0–52.0)
Hemoglobin: 11.7 g/dL — ABNORMAL LOW (ref 13.0–17.0)
MCH: 33 pg (ref 26.0–34.0)
MCHC: 31.6 g/dL (ref 30.0–36.0)
MCV: 104.2 fL — ABNORMAL HIGH (ref 80.0–100.0)
Platelets: 340 10*3/uL (ref 150–400)
RBC: 3.55 MIL/uL — ABNORMAL LOW (ref 4.22–5.81)
RDW: 13.9 % (ref 11.5–15.5)
WBC: 8.6 10*3/uL (ref 4.0–10.5)
nRBC: 0 % (ref 0.0–0.2)

## 2020-09-23 LAB — POCT I-STAT 7, (LYTES, BLD GAS, ICA,H+H)
Acid-Base Excess: 8 mmol/L — ABNORMAL HIGH (ref 0.0–2.0)
Bicarbonate: 34.5 mmol/L — ABNORMAL HIGH (ref 20.0–28.0)
Calcium, Ion: 1.11 mmol/L — ABNORMAL LOW (ref 1.15–1.40)
HCT: 36 % — ABNORMAL LOW (ref 39.0–52.0)
Hemoglobin: 12.2 g/dL — ABNORMAL LOW (ref 13.0–17.0)
O2 Saturation: 92 %
Patient temperature: 99.6
Potassium: 3.9 mmol/L (ref 3.5–5.1)
Sodium: 144 mmol/L (ref 135–145)
TCO2: 36 mmol/L — ABNORMAL HIGH (ref 22–32)
pCO2 arterial: 55.1 mmHg — ABNORMAL HIGH (ref 32.0–48.0)
pH, Arterial: 7.407 (ref 7.350–7.450)
pO2, Arterial: 68 mmHg — ABNORMAL LOW (ref 83.0–108.0)

## 2020-09-23 LAB — GLUCOSE, CAPILLARY
Glucose-Capillary: 107 mg/dL — ABNORMAL HIGH (ref 70–99)
Glucose-Capillary: 108 mg/dL — ABNORMAL HIGH (ref 70–99)
Glucose-Capillary: 109 mg/dL — ABNORMAL HIGH (ref 70–99)
Glucose-Capillary: 121 mg/dL — ABNORMAL HIGH (ref 70–99)
Glucose-Capillary: 123 mg/dL — ABNORMAL HIGH (ref 70–99)
Glucose-Capillary: 124 mg/dL — ABNORMAL HIGH (ref 70–99)

## 2020-09-23 LAB — MAGNESIUM: Magnesium: 2 mg/dL (ref 1.7–2.4)

## 2020-09-23 LAB — TRIGLYCERIDES: Triglycerides: 135 mg/dL (ref <150)

## 2020-09-23 LAB — PHOSPHORUS: Phosphorus: 4.5 mg/dL (ref 2.5–4.6)

## 2020-09-23 MED ORDER — PANTOPRAZOLE SODIUM 40 MG PO PACK
40.0000 mg | PACK | Freq: Every day | ORAL | Status: DC
  Administered 2020-09-24: 40 mg
  Filled 2020-09-23: qty 20

## 2020-09-23 MED ORDER — FENTANYL BOLUS VIA INFUSION
100.0000 ug | Freq: Once | INTRAVENOUS | Status: AC
  Administered 2020-09-23: 100 ug via INTRAVENOUS

## 2020-09-23 MED ORDER — ROCURONIUM BROMIDE 50 MG/5ML IV SOLN
80.0000 mg | Freq: Once | INTRAVENOUS | Status: AC
  Administered 2020-09-23: 80 mg via INTRAVENOUS

## 2020-09-23 MED ORDER — CHLORHEXIDINE GLUCONATE 0.12 % MT SOLN
OROMUCOSAL | Status: AC
  Administered 2020-09-23: 15 mL via OROMUCOSAL
  Filled 2020-09-23: qty 15

## 2020-09-23 MED ORDER — ETOMIDATE 2 MG/ML IV SOLN
20.0000 mg | Freq: Once | INTRAVENOUS | Status: AC
  Administered 2020-09-23: 20 mg via INTRAVENOUS

## 2020-09-23 MED ORDER — POTASSIUM CHLORIDE 20 MEQ PO PACK
40.0000 meq | PACK | Freq: Four times a day (QID) | ORAL | Status: AC
  Administered 2020-09-23 – 2020-09-24 (×4): 40 meq
  Filled 2020-09-23 (×4): qty 2

## 2020-09-23 MED ORDER — SODIUM CHLORIDE 0.9 % IN NEBU
3.0000 mL | INHALATION_SOLUTION | Freq: Two times a day (BID) | RESPIRATORY_TRACT | Status: DC
  Administered 2020-09-23 – 2020-09-28 (×9): 3 mL via RESPIRATORY_TRACT
  Filled 2020-09-23 (×16): qty 3

## 2020-09-23 MED ORDER — SPIRONOLACTONE 25 MG PO TABS
25.0000 mg | ORAL_TABLET | Freq: Every day | ORAL | Status: DC
  Administered 2020-09-23 – 2020-09-24 (×2): 25 mg
  Filled 2020-09-23 (×2): qty 1

## 2020-09-23 NOTE — Procedures (Signed)
Endotracheal Tube Exchange Procedure Note Antonio Tapia  [id number]  Jul 13, 1981    Procedure: Intubation Indications: ETT needed to be changed   Procedure Details Consent: Risks of procedure as well as the alternatives and risks of each were explained to the (patient/caregiver).  Consent for procedure obtained. Time Out: Performed   Maximum clean technique was used including gloves, hand hygiene and mask  Laryngoscopy equipment used: None   Medications: Fentanyl 120 mcg/hr gtt + 100 mcg bolus, Etomidate 20 mg, Rocuronium 80 mg  Evaluation Hemodynamic Status: BP stable throughout, transiently fell during during procedure Patient's Current Condition: stable Patient did tolerate procedure well Position verified via bronchoscopy.   Dr. Verdene Lennert Internal Medicine PGY-2  Pager: [phone]  09/23/2020, 12:08 PM

## 2020-09-23 NOTE — Progress Notes (Signed)
eLink Physician-Brief Progress Note Patient Name: Antonio Tapia DOB: 1981-02-23 MRN: [id number]   Date of Service  09/23/2020  HPI/Events of Note  Agitation - Request to renew restraint orders.  eICU Interventions  Will renew bilateral soft wrist restraints X 9 hours.      Intervention Category Major Interventions: Delirium, psychosis, severe agitation - evaluation and management  Talayia Hjort Eugene 09/23/2020, 11:23 PM

## 2020-09-23 NOTE — Progress Notes (Signed)
NAME:  Antonio Tapia, MRN:  [id number], DOB:  [birth year], LOS: 6 ADMISSION DATE:  09/17/2020, CONSULTATION DATE:  10/05 REFERRING MD:  Dr Gala Romney CHIEF COMPLAINT:  SVT   Brief History   Antonio Tapia is a 39 yr old male with PMH EOTH abuse, A flutter, tobacco abuse, chronic systolic heart failure admitted on 10/5 for SVT. Now in cardiogenic shock.  History of present illness   Antonio Tapia is a 39 y/o gentleman with a history of cardiomyopathy (unknown if ischemic), permanent Afib not on AC, and chronic HFrEF who has been off GDMT x 1 year and presented to outpatient cardiology clinic today with acute decompensated heart failure.  He was direct admitted to the hospital, subsequently developed SVT with HR as high as 190 who required emergent DCCV. He was intubated for acute respiratory failure due to cardiogenic pulmonary edema and need for DCCV. He has been started on inotropes to assist with diuresis.   Past Medical History  ETOH abuse A- flutter, no on AC Tobacco abuse HFrEF HTN   Significant Hospital Events   10/5: Admitted, cardioversion, intubated  Consults:  HF   PCCM  Procedures:  10/05 >> Intubation, CVC insertion, arterial line   Significant Diagnostic Tests:  Echo in 2019 >> EF 30-35%, systolic functionmoderately to severely reduced, Diffuse hypokinesis. Right ventricular systolic presure 53 mmHg, suggesting at least moderate pulmonary hypertension. Echo 10/5 >> LVEF 40-45% with global hypokinesis, G2DD CXR 10/9 >> ETT tip terminates 3.9 cm above the carina. A RIGHT IJ CVC tip terminates over the superior cavoatrial junction. Right sided pleural effusion. Infection Vs pulmonary edema   CXR 10/11 >> Worsening bilateral pleural effusions with persistent bibasilar airspace consolidations.   Micro Data:  COVID neg  Trach aspirate 10/7 >> MSSA Blood culture 10/7 >> NGTD x 4 days   Antimicrobials:  Vanc 10/7 > 10/8 Zosyn 10/7> 10/8 Cefazolin 10/9 >   Interim  history/subjective:   CXR obtained overnight due to hypoxia with Ppeak in the mid-40s. Results demonstrate worsening bilateral pleural effusions with persistent bibasilar airspace consolidations.   Febrile overnight with a T. Max of 100.7   Objective   Blood pressure 124/90, pulse 90, temperature (!) 100.7 F (38.2 C), temperature source Oral, resp. rate 17, height 6' (1.829 m), weight 124 kg, SpO2 100 %. CVP:  [8 mmHg-34 mmHg] 20 mmHg  Vent Mode: PRVC FiO2 (%):  [30 %-40 %] 40 % Set Rate:  [16 bmp] 16 bmp Vt Set:  [620 mL] 620 mL PEEP:  [5 cmH20] 5 cmH20 Plateau Pressure:  [23 cmH20-34 cmH20] 24 cmH20   Intake/Output Summary (Last 24 hours) at 09/23/2020 0905 Last data filed at 09/23/2020 0800 Gross per 24 hour  Intake 3548.62 ml  Output 4050 ml  Net -501.38 ml   Filed Weights   09/22/20 0100 09/22/20 0600 09/23/20 0500  Weight: 124.7 kg 122.8 kg 124 kg   Examination: General: Critically ill-appearing man intubated, alert, resting comfortably  HEENT: [place]/AT, eyes anicteric Lungs: Rhonchi bilaterally, copious tan secretions. Cardiovascular: Mildly tachycardic in the low 100s. Regular rhythm, no murmurs Abdomen: Soft, nontender, nondistended Extremities: No clubbing or cyanosis, minimal lower extremity edema Neuro: Awake and alert on minimal sedation. Following commands. Moving all extremities.  Skin: Warm and dry  Resolved Hospital Problem list   N/A  Assessment & Plan:   # Acute hypoxic respiratory failure # Acute MSSA pneumonia # Cardiogenic pulmonary edema - Con't PRVC, 4-8 cc/kg ideal body weight with goal plateau < 30; driving  pressure less than 15 - VAP prevention protocol - Daily SBT. Patient is alert and following commands, however extubation  - Continue CPT Q4h - Cefazolin to complete 7-day course of antibiotics (Day 5 today) - Pro-cal pending - Plan for non-urgent bronchoscopy today given evidence of mucus on CT and worsening CXR this morning.   # Mixed  cardiogenic and septic shock - Cefazolin to complete 7 day course (Day 5 today) - Pressors and inotropes to maintain MAP >65 - Vasopressin off this morning with minimal Norepi. Will work on weaning. - Should patient begin requiring additional pressor support, in light of low-grade fever, consider pan-culturing.   - Milrinone per HF, as below.   # Acute on chronic HFrEF # Cardiogenic shock # SVT s/p DCCV # Hx of Atrial Flutter - Continue Amiodarone infusion - Continue Milrinone gtt - Co-ox this AM 76%  - Continue Lasix gtt. Diuresing well with net negative of ~1L in the past 24 hours.  - Strict I/Os - Unable to tolerate guideline directed heart failure therapy due to shock  # AKI # ?CKD # Cardiorenal syndrome - Creatinine overall improving since admission but increased some compared to yesterday. - This may be patient's new baseline (~ 1.5 - 1.6.) No follow up in the last year to establish previous baseline.   - Strict I/Os - Renally dose meds and avoid nephrotoxic medications - Increase diuresis   # Acute anemia likely due to critical illness, chronic alcohol use - Hemoglobin stable this morning  - Transfuse for hemoglobin less than 7 or hemodynamically significant bleeding - Continue to monitor  # Elevated transaminases due to congestive hepatopathy, possibly due to ETOH use - Continue to monitor periodically  # Tobacco abuse - Nicotine patch - Encourage smoking cessation when appropriate  Best practice:  Diet: NPO  Pain/Anxiety/Delirium protocol (if indicated): Fentanyl, propofol, versed   VAP protocol (if indicated): yes DVT prophylaxis: Lovenox  GI prophylaxis: Protonix  Glucose control:  Mobility: bed rest Code Status: FULL  Family Communication:   Disposition: ICU   Labs   CBC: Recent Labs  Lab 09/17/20 1310 09/17/20 1403 09/18/20 0424 09/18/20 0424 09/18/20 0753 09/19/20 0418 09/20/20 0442 09/23/20 0256 09/23/20 0304  WBC 5.9  --  5.6  --   --   10.8* 6.5 8.6  --   HGB 12.8*   < > 12.0*   < > 12.6* 12.5* 12.3* 11.7* 12.2*  HCT 39.5   < > 36.2*   < > 37.0* 38.0* 38.4* 37.0* 36.0*  MCV 100.3*  --  99.2  --   --  102.2* 103.8* 104.2*  --   PLT 275  --  210  --   --  230 216 340  --    < > = values in this interval not displayed.    Basic Metabolic Panel: Recent Labs  Lab 09/18/20 0424 09/18/20 0753 09/19/20 0418 09/19/20 1622 09/20/20 0442 09/20/20 0442 09/20/20 1326 09/20/20 1326 09/21/20 0300 09/22/20 0433 09/22/20 1720 09/23/20 0256 09/23/20 0304  NA 138   < > 138   < > 138   < > 139   < > 137 143 144 144 144  K 3.1*   < > 4.3   < > 3.9   < > 3.5   < > 3.7 3.0* 3.1* 4.0 3.9  CL 103   < > 104   < > 103   < > 102  --  103 98 101 100  --  CO2 23   < > 22   < > 24   < > 24  --  24 31 32 30  --   GLUCOSE 108*   < > 135*   < > 149*   < > 155*  --  145* 155* 138* 129*  --   BUN 22*   < > 27*   < > 32*   < > 31*  --  35* 38* 39* 45*  --   CREATININE 2.66*   < > 3.36*   < > 2.21*   < > 2.03*  --  1.79* 1.64* 1.50* 1.65*  --   CALCIUM 8.3*   < > 7.9*   < > 7.9*   < > 7.8*  --  7.8* 7.9* 8.0* 8.6*  --   MG 1.7  --  2.2  --  2.4  --   --   --  2.2 1.8  --   --   --   PHOS  --   --  5.7*  --  4.4  --   --   --  3.8 3.3  --   --   --    < > = values in this interval not displayed.   GFR: Estimated Creatinine Clearance: 81.8 mL/min (A) (by C-G formula based on SCr of 1.65 mg/dL (H)). Recent Labs  Lab 09/17/20 1310 09/17/20 1310 09/17/20 1503 09/18/20 0424 09/19/20 0418 09/19/20 0839 09/20/20 0442 09/21/20 0300 09/23/20 0256  PROCALCITON  --   --   --   --   --  1.98 1.54 1.38  --   WBC 5.9   < >  --  5.6 10.8*  --  6.5  --  8.6  LATICACIDVEN 2.8*  --  1.8  --   --   --   --   --   --    < > = values in this interval not displayed.    Liver Function Tests: Recent Labs  Lab 09/17/20 0904 09/17/20 1310 09/18/20 1230  AST 71* 77* 73*  ALT 72* 71* 59*  ALKPHOS 56 57 46  BILITOT 1.2 1.4* 1.1  PROT 7.4 7.1 6.5    ALBUMIN 3.4* 3.3* 3.0*   No results for input(s): LIPASE, AMYLASE in the last 168 hours. No results for input(s): AMMONIA in the last 168 hours.  ABG    Component Value Date/Time   PHART 7.407 09/23/2020 0304   PCO2ART 55.1 (H) 09/23/2020 0304   PO2ART 68 (L) 09/23/2020 0304   HCO3 34.5 (H) 09/23/2020 0304   TCO2 36 (H) 09/23/2020 0304   ACIDBASEDEF 1.0 09/17/2020 1403   O2SAT 92.0 09/23/2020 0304     Coagulation Profile: No results for input(s): INR, PROTIME in the last 168 hours.  Cardiac Enzymes: No results for input(s): CKTOTAL, CKMB, CKMBINDEX, TROPONINI in the last 168 hours.  HbA1C: Hgb A1c MFr Bld  Date/Time Value Ref Range Status  10/12/2018 03:35 PM 5.8 (H) 4.8 - 5.6 % Final    Comment:             Prediabetes: 5.7 - 6.4          Diabetes: >6.4          Glycemic control for adults with diabetes: <7.0     CBG: Recent Labs  Lab 09/22/20 1557 09/22/20 1957 09/22/20 2317 09/23/20 0343 09/23/20 0840  GLUCAP 120* 127* 125* 107* 109*    Dr. Verdene Lennert Internal Medicine PGY-2  Pager: [phone]  09/23/2020, 9:06 AM

## 2020-09-23 NOTE — Progress Notes (Signed)
eLink Physician-Brief Progress Note Patient Name: Antonio Tapia DOB: December 06, 1981 MRN: [id number]   Date of Service  09/23/2020  HPI/Events of Note  Portable CXR reveals 1. Persistent bibasilar airspace consolidation. 3. Cardiomegaly with growing bilateral pleural effusions. Ppeak improved with suctioning.  ABG on 40%/PRVC 16/TV 620/P 5 = 7.407/55.1/68  eICU Interventions  Continue present management.      Intervention Category Major Interventions: Respiratory failure - evaluation and management  Saajan Willmon Eugene 09/23/2020, 6:06 AM

## 2020-09-23 NOTE — Procedures (Signed)
Bronchoscopy Procedure Note  Farron Watrous  [id number]  11-May-1981  Date:09/23/20  Time:1:32 PM   Provider Performing:Sanyla Summey C Katrinka Blazing   Procedure(s):  Initial Therapeutic Aspiration of Tracheobronchial Tree [phone])  Indication(s) Mucus plugging of bronchi  Consent Risks of the procedure as well as the alternatives and risks of each were explained to the patient and/or caregiver.  Consent for the procedure was obtained and is signed in the bedside chart  Anesthesia Etomiate/roc/fentanyl/propofol in place from ETT exchange   Time Out Verified patient identification, verified procedure, site/side was marked, verified correct patient position, special equipment/implants available, medications/allergies/relevant history reviewed, required imaging and test results available.   Sterile Technique Usual hand hygiene, masks, gowns, and gloves were used   Procedure Description Bronchoscope advanced through endotracheal tube and into airway.  Airways were examined down to subsegmental level with findings noted below.   Following diagnostic evaluation, Therapeutic aspiration performed in RLL and LLL  Findings:  - ETT a bit low, repositioned - Thick mucus plugs worse on R suctioned to segmental level   Complications/Tolerance None; patient tolerated the procedure well. Chest X-ray is not needed post procedure.   EBL Minimal   Specimen(s) None, MSSA already growing

## 2020-09-23 NOTE — Progress Notes (Signed)
Advanced Heart Failure Rounding Note  PCP-Cardiologist: No primary care provider on file.   Subjective:    - 10/5 Admitted with A/C Sysolic HF-->Decompensated d/t SVT.  Underwent Emergent DC-CV and intubation. Placed on Norepi + milrinone+ amio drip.  - 10/7 developed component of septic shock, CXR w/ RLL infiltrate (likely aspiration). PTC 1.98. Vanc + Zosyn added. VP + NE.  - 10/9 sputum growing MSSA on Ancef - CXR with large R effusion today. Chest CT with consolidation. No effusion   Remains on vent. Sedated. Copious secretions. Back on NE at 1. Remains on VP 0.03 and milrinone 0.125.  Co-ox 67% -> 51%. CVP 15   Creatinine stable 1.5-1.6   Objective:   Weight Range: 124 kg Body mass index is 37.08 kg/m.   Vital Signs:   Temp:  [98.4 F (36.9 C)-100.7 F (38.2 C)] 99.1 F (37.3 C) (10/11 0500) Pulse Rate:  [79-97] 90 (10/11 0800) Resp:  [13-24] 17 (10/11 0800) BP: (88-125)/(58-90) 124/90 (10/11 0800) SpO2:  [93 %-100 %] 100 % (10/11 0800) Arterial Line BP: (68-147)/(50-84) 142/78 (10/11 0800) FiO2 (%):  [30 %-40 %] 40 % (10/11 0753) Weight:  [124 kg] 124 kg (10/11 0500) Last BM Date:  (PTA)  Weight change: Filed Weights   09/22/20 0100 09/22/20 0600 09/23/20 0500  Weight: 124.7 kg 122.8 kg 124 kg    Intake/Output:   Intake/Output Summary (Last 24 hours) at 09/23/2020 0811 Last data filed at 09/23/2020 0800 Gross per 24 hour  Intake 3720.93 ml  Output 4400 ml  Net -679.07 ml      Physical Exam   General:  Sedated on vent.  HEENT: normal +ETT Neck: supple. RIJ TLC  Carotids 2+ bilat; no bruits. No lymphadenopathy or thryomegaly appreciated. Cor: PMI nondisplaced. Regular rate & rhythm. No rubs, gallops or murmurs. Lungs: coarse RLL Abdomen: soft, nontender, nondistended. No hepatosplenomegaly. No bruits or masses. Good bowel sounds. Extremities: no cyanosis, clubbing, rash, edema Neuro: sedated on vent  Telemetry   NSR 80-90s Personally  reviewed   Labs    CBC Recent Labs    09/23/20 0256 09/23/20 0304  WBC 8.6  --   HGB 11.7* 12.2*  HCT 37.0* 36.0*  MCV 104.2*  --   PLT 340  --    Basic Metabolic Panel Recent Labs    09/21/20 0300 09/21/20 0300 09/22/20 0433 09/22/20 0433 09/22/20 1720 09/22/20 1720 09/23/20 0256 09/23/20 0304  NA 137   < > 143   < > 144   < > 144 144  K 3.7   < > 3.0*   < > 3.1*   < > 4.0 3.9  CL 103   < > 98   < > 101  --  100  --   CO2 24   < > 31   < > 32  --  30  --   GLUCOSE 145*   < > 155*   < > 138*  --  129*  --   BUN 35*   < > 38*   < > 39*  --  45*  --   CREATININE 1.79*   < > 1.64*   < > 1.50*  --  1.65*  --   CALCIUM 7.8*   < > 7.9*   < > 8.0*  --  8.6*  --   MG 2.2  --  1.8  --   --   --   --   --   PHOS 3.8  --  3.3  --   --   --   --   --    < > = values in this interval not displayed.   Liver Function Tests No results for input(s): AST, ALT, ALKPHOS, BILITOT, PROT, ALBUMIN in the last 72 hours. No results for input(s): LIPASE, AMYLASE in the last 72 hours. Cardiac Enzymes No results for input(s): CKTOTAL, CKMB, CKMBINDEX, TROPONINI in the last 72 hours.  BNP: BNP (last 3 results) Recent Labs    09/17/20 0836  BNP 1,001.0*    ProBNP (last 3 results) No results for input(s): PROBNP in the last 8760 hours.   D-Dimer No results for input(s): DDIMER in the last 72 hours. Hemoglobin A1C No results for input(s): HGBA1C in the last 72 hours. Fasting Lipid Panel Recent Labs    09/23/20 0256  TRIG 135   Thyroid Function Tests No results for input(s): TSH, T4TOTAL, T3FREE, THYROIDAB in the last 72 hours.  Invalid input(s): FREET3  Other results:   Imaging    DG CHEST PORT 1 VIEW  Result Date: 09/23/2020 CLINICAL DATA:  Hypoxia EXAM: PORTABLE CHEST 1 VIEW COMPARISON:  09/21/2020 FINDINGS: The endotracheal tube terminates above the carina. The right-sided central venous catheter is unchanged. The enteric tube extends below the left hemidiaphragm.  The lung volumes are low. The heart size is enlarged. There are growing bilateral pleural effusions. There is persistent bibasilar airspace consolidation. There is no pneumothorax. IMPRESSION: 1. Lines and tubes as above. 2. Persistent bibasilar airspace consolidation. 3. Cardiomegaly with growing bilateral pleural effusions. Electronically Signed   By: Constance Holster M.D.   On: 09/23/2020 03:22     Medications:     Scheduled Medications: . chlorhexidine gluconate (MEDLINE KIT)  15 mL Mouth Rinse BID  . Chlorhexidine Gluconate Cloth  6 each Topical Daily  . docusate  100 mg Per Tube BID  . enoxaparin (LOVENOX) injection  60 mg Subcutaneous Q24H  . feeding supplement (PROSource TF)  90 mL Per Tube TID  . feeding supplement (VITAL HIGH PROTEIN)  1,000 mL Per Tube Q24H  . folic acid  1 mg Per Tube Daily  . influenza vac split quadrivalent PF  0.5 mL Intramuscular Tomorrow-1000  . insulin aspart  2-6 Units Subcutaneous Q4H  . ipratropium-albuterol  3 mL Nebulization Q6H  . mouth rinse  15 mL Mouth Rinse 10 times per day  . multivitamin  15 mL Per Tube Daily  . pantoprazole (PROTONIX) IV  40 mg Intravenous Daily  . pneumococcal 23 valent vaccine  0.5 mL Intramuscular Tomorrow-1000  . polyethylene glycol  17 g Per Tube Daily  . sodium chloride  3 mL Nebulization BID  . sodium chloride flush  10-40 mL Intracatheter Q12H  . sodium chloride flush  3 mL Intravenous Q12H  . spironolactone  25 mg Oral Daily  . thiamine  100 mg Per Tube Daily    Infusions: . sodium chloride Stopped (09/22/20 2203)  . sodium chloride 12.5 mL/hr at 09/20/20 1900  . amiodarone 30 mg/hr (09/23/20 0800)  .  ceFAZolin (ANCEF) IV Stopped (09/23/20 0537)  . fentaNYL infusion INTRAVENOUS 125 mcg/hr (09/23/20 0800)  . furosemide (LASIX) infusion 15 mg/hr (09/23/20 0800)  . midazolam Stopped (09/18/20 0914)  . milrinone 0.125 mcg/kg/min (09/23/20 0800)  . norepinephrine (LEVOPHED) Adult infusion 1 mcg/min  (09/23/20 0800)  . propofol (DIPRIVAN) infusion 20 mcg/kg/min (09/23/20 0800)  . vasopressin 0.03 Units/min (09/23/20 0800)    PRN Medications: sodium chloride, acetaminophen, fentaNYL, midazolam, ondansetron (ZOFRAN) IV, sodium  chloride flush, sodium chloride flush     Assessment/Plan   1. A/C Systolic HF -->Cardiogenic Shock + Septic Shock  - ECHO EF 20% with moderate RV failure. - Co-ox now down to 51% On milrinone 0.125, NE 1 and VP 0.03. BPs ok.  - stop VP. Repeat co-ox. If Co-ox < 60% increase milrinone  - Continue lasix gtt at 15  - No BB with shock - Continue spiro - Add ARNI soon  - Once stabilized he will need cath to further evaluate  - follow CVPs and BMET  2. Aspiration PNA with septic shock - sputum cx + MSSA - CT chest 10/9 with dense RML/RLL effusion. No effusion - continue cefazolin  3. Acute Respiratory Failure - Intubated 10/5. CCM following. - treat aspiration PNA as above - diurese - appreciate PCCM assistance  - has heavy secretions and CVP still up . Likely not ready for extubation yet   4. PAF - Has history of A flutter back in 2019. Off xarelto due cost for 1 year.  - maintaining NSR.    5. SVT - S/p emergent cardioversion 10/5 - remains in NSR currently. No recurrence at this point  - Continue amio drip 30 mg per hour while on inotropes  - no change  5. AKI - Developed worsening renal function in the setting of shock and ATN - Creatinine improving with inotropic support. Stable 1.5-1.6 today - Continue lasix gtt. Check electrolytes   6.  ETOH  - Watch for withdrawal.  - Place on CIWA  7.  Tobacco Abuse   CRITICAL CARE Performed by: Glori Bickers  Total critical care time: 35 minutes  Critical care time was exclusive of separately billable procedures and treating other patients.  Critical care was necessary to treat or prevent imminent or life-threatening deterioration.  Critical care was time spent personally by me  (independent of midlevel providers or residents) on the following activities: development of treatment plan with patient and/or surrogate as well as nursing, discussions with consultants, evaluation of patient's response to treatment, examination of patient, obtaining history from patient or surrogate, ordering and performing treatments and interventions, ordering and review of laboratory studies, ordering and review of radiographic studies, pulse oximetry and re-evaluation of patient's condition.   Length of Stay: [place], MD  09/23/2020, 8:11 AM  Advanced Heart Failure Team Pager [phone] (M-F; 7a - 4p)  Please contact [facility] for night-coverage after hours (4p -7a ) and weekends on [email or web address]

## 2020-09-23 NOTE — Progress Notes (Signed)
eLink Physician-Brief Progress Note Patient Name: Antonio Tapia DOB: [birth year] MRN: [id number]   Date of Service  09/23/2020  HPI/Events of Note  Hypoxia - Ppeak in mid 40's.  eICU Interventions  Plan: 1.  Portable CXR STAT.      Intervention Category Major Interventions: Hypoxemia - evaluation and management  Shera Laubach [place] 09/23/2020, 2:48 AM

## 2020-09-24 ENCOUNTER — Inpatient Hospital Stay (HOSPITAL_COMMUNITY): Payer: Self-pay

## 2020-09-24 DIAGNOSIS — R0902 Hypoxemia: Secondary | ICD-10-CM

## 2020-09-24 LAB — POCT I-STAT 7, (LYTES, BLD GAS, ICA,H+H)
Acid-Base Excess: 12 mmol/L — ABNORMAL HIGH (ref 0.0–2.0)
Bicarbonate: 36.8 mmol/L — ABNORMAL HIGH (ref 20.0–28.0)
Calcium, Ion: 1.16 mmol/L (ref 1.15–1.40)
HCT: 33 % — ABNORMAL LOW (ref 39.0–52.0)
Hemoglobin: 11.2 g/dL — ABNORMAL LOW (ref 13.0–17.0)
O2 Saturation: 99 %
Patient temperature: 99.8
Potassium: 3.9 mmol/L (ref 3.5–5.1)
Sodium: 148 mmol/L — ABNORMAL HIGH (ref 135–145)
TCO2: 38 mmol/L — ABNORMAL HIGH (ref 22–32)
pCO2 arterial: 48.8 mmHg — ABNORMAL HIGH (ref 32.0–48.0)
pH, Arterial: 7.488 — ABNORMAL HIGH (ref 7.350–7.450)
pO2, Arterial: 129 mmHg — ABNORMAL HIGH (ref 83.0–108.0)

## 2020-09-24 LAB — CBC
HCT: 35.8 % — ABNORMAL LOW (ref 39.0–52.0)
Hemoglobin: 10.8 g/dL — ABNORMAL LOW (ref 13.0–17.0)
MCH: 32 pg (ref 26.0–34.0)
MCHC: 30.2 g/dL (ref 30.0–36.0)
MCV: 106.2 fL — ABNORMAL HIGH (ref 80.0–100.0)
Platelets: 371 10*3/uL (ref 150–400)
RBC: 3.37 MIL/uL — ABNORMAL LOW (ref 4.22–5.81)
RDW: 14.1 % (ref 11.5–15.5)
WBC: 6.5 10*3/uL (ref 4.0–10.5)
nRBC: 0 % (ref 0.0–0.2)

## 2020-09-24 LAB — BASIC METABOLIC PANEL
Anion gap: 14 (ref 5–15)
BUN: 51 mg/dL — ABNORMAL HIGH (ref 6–20)
CO2: 29 mmol/L (ref 22–32)
Calcium: 8.8 mg/dL — ABNORMAL LOW (ref 8.9–10.3)
Chloride: 103 mmol/L (ref 98–111)
Creatinine, Ser: 1.87 mg/dL — ABNORMAL HIGH (ref 0.61–1.24)
GFR, Estimated: 44 mL/min — ABNORMAL LOW (ref >60)
Glucose, Bld: 143 mg/dL — ABNORMAL HIGH (ref 70–99)
Potassium: 4.2 mmol/L (ref 3.5–5.1)
Sodium: 146 mmol/L — ABNORMAL HIGH (ref 135–145)

## 2020-09-24 LAB — CULTURE, BLOOD (ROUTINE X 2)
Culture: NO GROWTH
Culture: NO GROWTH
Special Requests: ADEQUATE
Special Requests: ADEQUATE

## 2020-09-24 LAB — COOXEMETRY PANEL
Carboxyhemoglobin: 0.8 % (ref 0.5–1.5)
Methemoglobin: 0.7 % (ref 0.0–1.5)
O2 Saturation: 73.4 %
Total hemoglobin: 13.7 g/dL (ref 12.0–16.0)

## 2020-09-24 LAB — GLUCOSE, CAPILLARY
Glucose-Capillary: 107 mg/dL — ABNORMAL HIGH (ref 70–99)
Glucose-Capillary: 111 mg/dL — ABNORMAL HIGH (ref 70–99)
Glucose-Capillary: 112 mg/dL — ABNORMAL HIGH (ref 70–99)
Glucose-Capillary: 120 mg/dL — ABNORMAL HIGH (ref 70–99)
Glucose-Capillary: 161 mg/dL — ABNORMAL HIGH (ref 70–99)
Glucose-Capillary: 89 mg/dL (ref 70–99)

## 2020-09-24 LAB — HEPATIC FUNCTION PANEL
ALT: 21 U/L (ref 0–44)
AST: 45 U/L — ABNORMAL HIGH (ref 15–41)
Albumin: 2.3 g/dL — ABNORMAL LOW (ref 3.5–5.0)
Alkaline Phosphatase: 51 U/L (ref 38–126)
Bilirubin, Direct: 0.2 mg/dL (ref 0.0–0.2)
Indirect Bilirubin: 0.3 mg/dL (ref 0.3–0.9)
Total Bilirubin: 0.5 mg/dL (ref 0.3–1.2)
Total Protein: 7.7 g/dL (ref 6.5–8.1)

## 2020-09-24 LAB — PHOSPHORUS: Phosphorus: 4.5 mg/dL (ref 2.5–4.6)

## 2020-09-24 LAB — TRIGLYCERIDES: Triglycerides: 123 mg/dL (ref <150)

## 2020-09-24 LAB — MAGNESIUM: Magnesium: 2 mg/dL (ref 1.7–2.4)

## 2020-09-24 MED ORDER — SENNOSIDES-DOCUSATE SODIUM 8.6-50 MG PO TABS
1.0000 | ORAL_TABLET | Freq: Two times a day (BID) | ORAL | Status: DC
  Administered 2020-09-24: 1
  Filled 2020-09-24: qty 1

## 2020-09-24 MED ORDER — SPIRONOLACTONE 25 MG PO TABS
25.0000 mg | ORAL_TABLET | Freq: Every day | ORAL | Status: DC
  Administered 2020-09-25 – 2020-09-30 (×6): 25 mg via ORAL
  Filled 2020-09-24 (×7): qty 1

## 2020-09-24 MED ORDER — HYDRALAZINE HCL 20 MG/ML IJ SOLN
5.0000 mg | INTRAMUSCULAR | Status: DC | PRN
  Administered 2020-09-24 (×2): 5 mg via INTRAVENOUS
  Filled 2020-09-24 (×3): qty 1

## 2020-09-24 MED ORDER — SENNOSIDES-DOCUSATE SODIUM 8.6-50 MG PO TABS
1.0000 | ORAL_TABLET | Freq: Two times a day (BID) | ORAL | Status: DC
  Administered 2020-09-26 – 2020-09-30 (×3): 1 via ORAL
  Filled 2020-09-24 (×6): qty 1

## 2020-09-24 MED ORDER — ORAL CARE MOUTH RINSE
15.0000 mL | Freq: Two times a day (BID) | OROMUCOSAL | Status: DC
  Administered 2020-09-25 – 2020-09-30 (×8): 15 mL via OROMUCOSAL

## 2020-09-24 MED ORDER — POLYETHYLENE GLYCOL 3350 17 G PO PACK
17.0000 g | PACK | Freq: Two times a day (BID) | ORAL | Status: DC
  Administered 2020-09-26 – 2020-09-30 (×3): 17 g via ORAL
  Filled 2020-09-24 (×5): qty 1

## 2020-09-24 MED ORDER — LORAZEPAM 1 MG PO TABS
1.0000 mg | ORAL_TABLET | Freq: Once | ORAL | Status: AC
  Administered 2020-09-24: 1 mg via ORAL
  Filled 2020-09-24: qty 1

## 2020-09-24 MED ORDER — FOLIC ACID 1 MG PO TABS
1.0000 mg | ORAL_TABLET | Freq: Every day | ORAL | Status: DC
  Administered 2020-09-25 – 2020-09-30 (×6): 1 mg via ORAL
  Filled 2020-09-24 (×6): qty 1

## 2020-09-24 MED ORDER — HYDRALAZINE HCL 20 MG/ML IJ SOLN
10.0000 mg | INTRAMUSCULAR | Status: DC | PRN
  Administered 2020-09-24 – 2020-09-29 (×5): 10 mg via INTRAVENOUS
  Filled 2020-09-24 (×4): qty 1

## 2020-09-24 MED ORDER — ADULT MULTIVITAMIN LIQUID CH
15.0000 mL | Freq: Every day | ORAL | Status: DC
  Filled 2020-09-24: qty 15

## 2020-09-24 MED ORDER — PANTOPRAZOLE SODIUM 40 MG PO TBEC
40.0000 mg | DELAYED_RELEASE_TABLET | Freq: Every day | ORAL | Status: DC
  Administered 2020-09-25 – 2020-09-30 (×6): 40 mg via ORAL
  Filled 2020-09-24 (×6): qty 1

## 2020-09-24 MED ORDER — ACETAMINOPHEN 325 MG PO TABS
650.0000 mg | ORAL_TABLET | ORAL | Status: DC | PRN
  Administered 2020-09-24: 650 mg via ORAL
  Filled 2020-09-24: qty 2

## 2020-09-24 MED ORDER — HYDRALAZINE HCL 25 MG PO TABS: 25.0000 mg | ORAL_TABLET | Freq: Three times a day (TID) | ORAL | Status: DC

## 2020-09-24 MED ORDER — HYDRALAZINE HCL 25 MG PO TABS
12.5000 mg | ORAL_TABLET | Freq: Three times a day (TID) | ORAL | Status: DC
  Administered 2020-09-24: 12.5 mg via ORAL
  Filled 2020-09-24: qty 1

## 2020-09-24 MED ORDER — POLYETHYLENE GLYCOL 3350 17 G PO PACK: 17.0000 g | PACK | Freq: Two times a day (BID) | ORAL | Status: DC

## 2020-09-24 MED ORDER — THIAMINE HCL 100 MG PO TABS
100.0000 mg | ORAL_TABLET | Freq: Every day | ORAL | Status: DC
  Administered 2020-09-25 – 2020-09-30 (×6): 100 mg via ORAL
  Filled 2020-09-24 (×7): qty 1

## 2020-09-24 NOTE — Progress Notes (Signed)
NAME:  Antonio Tapia, MRN:  [id number], DOB:  August 21, 1981, LOS: 7 ADMISSION DATE:  09/17/2020, CONSULTATION DATE:  10/05 REFERRING MD:  Dr Gala Romney CHIEF COMPLAINT:  SVT   Brief History   Antonio Tapia is a 39 yr old male with PMH EOTH abuse, A flutter, tobacco abuse, chronic systolic heart failure admitted on 10/5 for SVT. Now in cardiogenic shock.  History of present illness   Antonio Tapia is a 39 y/o gentleman with a history of cardiomyopathy (unknown if ischemic), permanent Afib not on AC, and chronic HFrEF who has been off GDMT x 1 year and presented to outpatient cardiology clinic today with acute decompensated heart failure.  He was direct admitted to the hospital, subsequently developed SVT with HR as high as 190 who required emergent DCCV. He was intubated for acute respiratory failure due to cardiogenic pulmonary edema and need for DCCV. He has been started on inotropes to assist with diuresis.   Past Medical History  ETOH abuse A- flutter, no on AC Tobacco abuse HFrEF HTN   Significant Hospital Events   10/5: Admitted, cardioversion, intubated  Consults:  HF   PCCM  Procedures:  10/05 >> Intubation, CVC insertion, arterial line  10/11 >> Bronchoscopy   Significant Diagnostic Tests:  Echo in 2019 >> EF 30-35%, systolic functionmoderately to severely reduced, Diffuse hypokinesis. Right ventricular systolic presure 53 mmHg, suggesting at least moderate pulmonary hypertension. Echo 10/5 >> LVEF 40-45% with global hypokinesis, G2DD CXR 10/9 >> ETT tip terminates 3.9 cm above the carina. A RIGHT IJ CVC tip terminates over the superior cavoatrial junction. Right sided pleural effusion. Infection Vs pulmonary edema   CXR 10/11 >> Worsening bilateral pleural effusions with persistent bibasilar airspace consolidations.   Micro Data:  COVID neg  Trach aspirate 10/7 >> MSSA Blood culture 10/7 >> NGTD x 4 days   Antimicrobials:  Vanc 10/7 > 10/8 Zosyn 10/7> 10/8 Cefazolin 10/9 >     Interim history/subjective:   Overnight, patient continued to be febrile up to 101.3 requiring ice packs and Tylenol. Some agitation noted as well requiring restraints. Blood pressure has been labile with peaks occurring when patient is agitated. Hypotensive when calm or sedated, requiring low dose Norepi still.   Objective   Blood pressure 136/70, pulse 84, temperature 100.2 F (37.9 C), temperature source Oral, resp. rate 19, height 6' (1.829 m), weight 120 kg, SpO2 99 %. CVP:  [6 mmHg-40 mmHg] 6 mmHg  Vent Mode: PRVC FiO2 (%):  [40 %] 40 % Set Rate:  [16 bmp] 16 bmp Vt Set:  [620 mL] 620 mL PEEP:  [5 cmH20] 5 cmH20 Plateau Pressure:  [21 cmH20-24 cmH20] 22 cmH20   Intake/Output Summary (Last 24 hours) at 09/24/2020 0751 Last data filed at 09/24/2020 0700 Gross per 24 hour  Intake 3589.98 ml  Output 5095 ml  Net -1505.02 ml   Filed Weights   09/22/20 0600 09/23/20 0500 09/24/20 0500  Weight: 122.8 kg 124 kg 120 kg   Examination: General: Critically ill-appearing man intubated, alert, resting comfortably  HEENT: [place]/AT, eyes anicteric Lungs: Minimal rhonchi on the right. Clear to auscultation on the left.  Cardiovascular: Regular rate and rhythm, no murmurs Abdomen: Soft, nontender, mildly distended Extremities: No clubbing or cyanosis, No extremity edema Neuro: Awake and alert on minimal sedation. Following commands. Moving all extremities.  Skin: Warm and dry. No ischemia noted on digits.   Resolved Hospital Problem list   N/A  Assessment & Plan:   # Acute hypoxic respiratory  failure # Acute MSSA pneumonia # Cardiogenic pulmonary edema - Con't PRVC, 4-8 cc/kg ideal body weight with goal plateau < 30; driving pressure less than 15 - VAP prevention protocol - Daily SBT. Patient is alert and following commands with minimal sedation. Will continue decreasing sedation prior to additional SBTs. If analgesia/anxiolytic needed, consider Precedex.  - Bronchoscopy on 10/11  with thick mucus plugs on the R>L that were suctioned. Significantly improved oxygenation today  - Patient continues to be febrile overnight with a T. Max of 101.3, however procalcitonin has decreased.  - Continue Cefazolin. Given severity of pneumonia and patient's persistent intermittent fevers, plan for 10-14 day course.   # Mixed cardiogenic and septic shock - Continue Cefazolin - Pressors and inotropes to maintain MAP >65 - Continues to require Norepi. Continue to wean as tolerated.  - Milrinone per HF  # Acute on chronic HFrEF # Cardiogenic shock # SVT s/p DCCV # Hx of Atrial Flutter - Continue Amiodarone infusion. Telemetry shows stable sinus rhythm.  - Continue Milrinone gtt  - Co-ox this AM 73%, CVP of 6.  - Lasix gtt has been discontinued at this time - Strict I/Os - Unable to tolerate guideline directed heart failure therapy due to shock  # AKI # ? CKD - Unknown baseline due to lack of follow up  - Creatinine continues to rise today (1.87) - Differential includes pre-renal in light of heavy diuresis versus intra-renal (ATN) from shock. Differential also includes cardiorenal syndrome. BUN/Cr ratio of > 20 suggest pre-renal. Will continue to monitor closely.  - Strict I/Os - Renally dose meds and avoid nephrotoxic medications  # Acute anemia likely due to critical illness, chronic alcohol use - Hemoglobin continues to be stable at this time.  - Transfuse for hemoglobin less than 7 or hemodynamically significant bleeding - Continue to monitor  # Elevated transaminases due to congestive hepatopathy, possibly due to ETOH use - Hepatic panel pending   # Tobacco abuse - Nicotine patch - Encourage smoking cessation when appropriate  Best practice:  Diet: NPO  Pain/Anxiety/Delirium protocol (if indicated): Fentanyl, propofol  VAP protocol (if indicated): Ordered DVT prophylaxis: Lovenox  GI prophylaxis: Protonix  Glucose control: SSI  Mobility: bed rest Code Status:  FULL  Family Communication: Per Primary Disposition: ICU   Labs   CBC: Recent Labs  Lab 09/18/20 0424 09/18/20 0753 09/19/20 0418 09/19/20 0418 09/20/20 0442 09/23/20 0256 09/23/20 0304 09/24/20 0341 09/24/20 0526  WBC 5.6  --  10.8*  --  6.5 8.6  --  6.5  --   HGB 12.0*   < > 12.5*   < > 12.3* 11.7* 12.2* 10.8* 11.2*  HCT 36.2*   < > 38.0*   < > 38.4* 37.0* 36.0* 35.8* 33.0*  MCV 99.2  --  102.2*  --  103.8* 104.2*  --  106.2*  --   PLT 210  --  230  --  216 340  --  371  --    < > = values in this interval not displayed.    Basic Metabolic Panel: Recent Labs  Lab 09/20/20 0442 09/20/20 1326 09/21/20 0300 09/21/20 0300 09/22/20 0433 09/22/20 0433 09/22/20 1720 09/23/20 0256 09/23/20 0304 09/23/20 1044 09/24/20 0341 09/24/20 0526  NA 138   < > 137   < > 143   < > 144 144 144  --  146* 148*  K 3.9   < > 3.7   < > 3.0*   < > 3.1* 4.0 3.9  --  4.2 3.9  CL 103   < > 103  --  98  --  101 100  --   --  103  --   CO2 24   < > 24  --  31  --  32 30  --   --  29  --   GLUCOSE 149*   < > 145*  --  155*  --  138* 129*  --   --  143*  --   BUN 32*   < > 35*  --  38*  --  39* 45*  --   --  51*  --   CREATININE 2.21*   < > 1.79*  --  1.64*  --  1.50* 1.65*  --   --  1.87*  --   CALCIUM 7.9*   < > 7.8*  --  7.9*  --  8.0* 8.6*  --   --  8.8*  --   MG 2.4  --  2.2  --  1.8  --   --   --   --  2.0 2.0  --   PHOS 4.4  --  3.8  --  3.3  --   --   --   --  4.5 4.5  --    < > = values in this interval not displayed.   GFR: Estimated Creatinine Clearance: 71 mL/min (A) (by C-G formula based on SCr of 1.87 mg/dL (H)). Recent Labs  Lab 09/17/20 1310 09/17/20 1503 09/18/20 0424 09/19/20 0418 09/19/20 0839 09/20/20 0442 09/21/20 0300 09/23/20 0256 09/23/20 1044 09/24/20 0341  PROCALCITON  --   --   --   --  1.98 1.54 1.38  --  0.90  --   WBC 5.9  --    < > 10.8*  --  6.5  --  8.6  --  6.5  LATICACIDVEN 2.8* 1.8  --   --   --   --   --   --   --   --    < > = values in this  interval not displayed.    Liver Function Tests: Recent Labs  Lab 09/17/20 0904 09/17/20 1310 09/18/20 1230  AST 71* 77* 73*  ALT 72* 71* 59*  ALKPHOS 56 57 46  BILITOT 1.2 1.4* 1.1  PROT 7.4 7.1 6.5  ALBUMIN 3.4* 3.3* 3.0*   No results for input(s): LIPASE, AMYLASE in the last 168 hours. No results for input(s): AMMONIA in the last 168 hours.  ABG    Component Value Date/Time   PHART 7.488 (H) 09/24/2020 0526   PCO2ART 48.8 (H) 09/24/2020 0526   PO2ART 129 (H) 09/24/2020 0526   HCO3 36.8 (H) 09/24/2020 0526   TCO2 38 (H) 09/24/2020 0526   ACIDBASEDEF 1.0 09/17/2020 1403   O2SAT 99.0 09/24/2020 0526     Coagulation Profile: No results for input(s): INR, PROTIME in the last 168 hours.  Cardiac Enzymes: No results for input(s): CKTOTAL, CKMB, CKMBINDEX, TROPONINI in the last 168 hours.  HbA1C: Hgb A1c MFr Bld  Date/Time Value Ref Range Status  10/12/2018 03:35 PM 5.8 (H) 4.8 - 5.6 % Final    Comment:             Prediabetes: 5.7 - 6.4          Diabetes: >6.4          Glycemic control for adults with diabetes: <7.0     CBG: Recent Labs  Lab 09/23/20 1235 09/23/20  1538 09/23/20 2001 09/23/20 2339 09/24/20 0335  GLUCAP 124* 108* 123* 121* 161*    Dr. Verdene Lennert Internal Medicine PGY-2  Pager: [phone]  09/24/2020, 7:51 AM

## 2020-09-24 NOTE — Progress Notes (Signed)
Advanced Heart Failure Rounding Note  PCP-Cardiologist: No primary care provider on file.   Subjective:    - 10/5 Admitted with A/C Sysolic HF-->Decompensated d/t SVT.  Underwent Emergent DC-CV and intubation. Placed on Norepi + milrinone+ amio drip.  - 10/7 developed component of septic shock, CXR w/ RLL infiltrate (likely aspiration). PTC 1.98. Vanc + Zosyn added. VP + NE.  - 10/9 sputum growing MSSA on Ancef - 10/9 CXR with large R effusion.Chest CT with consolidation. No effusion  - 10/11 Bronch with removal of secretions and upsize ETT   Remains on vent. Sedated with propofol. Bronched yesterday with removal of secretions and upsize ETT  Continues to diurese well on lasix gtt at 15.  CVP now 5. Rhythm stable on IV amio. BP very labile. High when awake. Low when sedated  Co-ox 73% on milrinone 0.125 and NE 2    Objective:   Weight Range: 124 kg Body mass index is 37.08 kg/m.   Vital Signs:   Temp:  [99.1 F (37.3 C)-101.3 F (38.5 C)] 100.2 F (37.9 C) (10/12 0352) Pulse Rate:  [70-101] 97 (10/12 0300) Resp:  [14-31] 16 (10/12 0300) BP: (92-127)/(52-90) 94/54 (10/12 0200) SpO2:  [89 %-100 %] 97 % (10/12 0300) Arterial Line BP: (93-213)/(49-98) 116/60 (10/12 0300) FiO2 (%):  [40 %] 40 % (10/12 0110) Weight:  [124 kg] 124 kg (10/11 0500) Last BM Date:  (PTA)  Weight change: Filed Weights   09/22/20 0100 09/22/20 0600 09/23/20 0500  Weight: 124.7 kg 122.8 kg 124 kg    Intake/Output:   Intake/Output Summary (Last 24 hours) at 09/24/2020 0453 Last data filed at 09/24/2020 0300 Gross per 24 hour  Intake 3568 ml  Output 4840 ml  Net -1272 ml      Physical Exam   General:  Sedated on vent but will arouse to pain HEENT: normal + ETT with thick secretions Neck: supple. no JVD. Carotids 2+ bilat; no bruits. No lymphadenopathy or thryomegaly appreciated. Cor: PMI nondisplaced. Regular rate & rhythm. No rubs, gallops or murmurs. Lungs: coarse RLL Abdomen:  soft, nontender, nondistended. No hepatosplenomegaly. No bruits or masses. Good bowel sounds. Extremities: no cyanosis, clubbing, rash, edema Neuro: sedated on vent   Telemetry   NSR 90s  Personally reviewed   Labs    CBC Recent Labs    09/23/20 0256 09/23/20 0256 09/23/20 0304 09/24/20 0341  WBC 8.6  --   --  6.5  HGB 11.7*   < > 12.2* 10.8*  HCT 37.0*   < > 36.0* 35.8*  MCV 104.2*  --   --  106.2*  PLT 340  --   --  371   < > = values in this interval not displayed.   Basic Metabolic Panel Recent Labs    09/22/20 0433 09/22/20 0433 09/22/20 1720 09/22/20 1720 09/23/20 0256 09/23/20 0304 09/23/20 1044  NA 143   < > 144   < > 144 144  --   K 3.0*   < > 3.1*   < > 4.0 3.9  --   CL 98   < > 101  --  100  --   --   CO2 31   < > 32  --  30  --   --   GLUCOSE 155*   < > 138*  --  129*  --   --   BUN 38*   < > 39*  --  45*  --   --  CREATININE 1.64*   < > 1.50*  --  1.65*  --   --   CALCIUM 7.9*   < > 8.0*  --  8.6*  --   --   MG 1.8  --   --   --   --   --  2.0  PHOS 3.3  --   --   --   --   --  4.5   < > = values in this interval not displayed.   Liver Function Tests No results for input(s): AST, ALT, ALKPHOS, BILITOT, PROT, ALBUMIN in the last 72 hours. No results for input(s): LIPASE, AMYLASE in the last 72 hours. Cardiac Enzymes No results for input(s): CKTOTAL, CKMB, CKMBINDEX, TROPONINI in the last 72 hours.  BNP: BNP (last 3 results) Recent Labs    09/17/20 0836  BNP 1,001.0*    ProBNP (last 3 results) No results for input(s): PROBNP in the last 8760 hours.   D-Dimer No results for input(s): DDIMER in the last 72 hours. Hemoglobin A1C No results for input(s): HGBA1C in the last 72 hours. Fasting Lipid Panel Recent Labs    09/23/20 0256  TRIG 135   Thyroid Function Tests No results for input(s): TSH, T4TOTAL, T3FREE, THYROIDAB in the last 72 hours.  Invalid input(s): FREET3  Other results:   Imaging    DG Abd 1 View  Result  Date: 09/23/2020 CLINICAL DATA:  Orogastric tube placement. EXAM: ABDOMEN - 1 VIEW COMPARISON:  None. FINDINGS: The bowel gas pattern is normal. Distal tip of enteric tube is seen in proximal stomach. No radio-opaque calculi or other significant radiographic abnormality are seen. IMPRESSION: Distal tip of enteric tube seen in proximal stomach. Electronically Signed   By: Marijo Conception M.D.   On: 09/23/2020 13:46     Medications:     Scheduled Medications: . chlorhexidine gluconate (MEDLINE KIT)  15 mL Mouth Rinse BID  . Chlorhexidine Gluconate Cloth  6 each Topical Daily  . docusate  100 mg Per Tube BID  . enoxaparin (LOVENOX) injection  60 mg Subcutaneous Q24H  . feeding supplement (PROSource TF)  90 mL Per Tube TID  . feeding supplement (VITAL HIGH PROTEIN)  1,000 mL Per Tube Q24H  . folic acid  1 mg Per Tube Daily  . influenza vac split quadrivalent PF  0.5 mL Intramuscular Tomorrow-1000  . insulin aspart  2-6 Units Subcutaneous Q4H  . ipratropium-albuterol  3 mL Nebulization Q6H  . mouth rinse  15 mL Mouth Rinse 10 times per day  . multivitamin  15 mL Per Tube Daily  . pantoprazole sodium  40 mg Per Tube Daily  . pneumococcal 23 valent vaccine  0.5 mL Intramuscular Tomorrow-1000  . polyethylene glycol  17 g Per Tube Daily  . sodium chloride  3 mL Nebulization BID  . sodium chloride flush  10-40 mL Intracatheter Q12H  . sodium chloride flush  3 mL Intravenous Q12H  . spironolactone  25 mg Per Tube Daily  . thiamine  100 mg Per Tube Daily    Infusions: . sodium chloride Stopped (09/22/20 2203)  . sodium chloride 10 mL/hr at 09/24/20 0300  . amiodarone 30 mg/hr (09/24/20 0300)  .  ceFAZolin (ANCEF) IV Stopped (09/23/20 2218)  . fentaNYL infusion INTRAVENOUS 100 mcg/hr (09/24/20 0300)  . furosemide (LASIX) infusion 15 mg/hr (09/24/20 0300)  . midazolam Stopped (09/18/20 0914)  . milrinone 0.125 mcg/kg/min (09/24/20 0300)  . norepinephrine (LEVOPHED) Adult infusion 2 mcg/min  (09/24/20 0300)  .  propofol (DIPRIVAN) infusion 20 mcg/kg/min (09/24/20 0300)    PRN Medications: sodium chloride, acetaminophen, fentaNYL, midazolam, ondansetron (ZOFRAN) IV, sodium chloride flush, sodium chloride flush     Assessment/Plan   1. A/C Systolic HF -->Cardiogenic Shock + Septic Shock  - ECHO EF 20% with moderate RV failure. - Co-ox 73% on milrinone 0.125 and NE 2. CVP 5 - Stop lasix gtt.  - HF much improved. Main issue now is PNA and sepsis - Continue NE and milrinone. Can hold milrinone if BP low.  - No BB with shock - Continue spiro - Once stabilized he will need cath to further evaluate. Eventual ARNI - continue to follow CVPs and co-ox  2. Aspiration PNA with septic shock - sputum cx + MSSA - CT chest 10/9 with dense RML/RLL effusion. No effusion - Bronch 10/11 with removal of secretions and upsize ETT - continue cefazolin  3. Acute Respiratory Failure - Intubated 10/5. CCM following. - treat aspiration PNA as above - diurese - appreciate PCCM assistance   4. PAF - Has history of A flutter back in 2019. Off xarelto due cost for 1 year.  - maintaining NSR on IC amio  5. SVT - S/p emergent cardioversion 10/5 - remains in NSR currently. No recurrence at this point  - Continue amio drip 30 mg per hour while on inotropes  - no change  5. AKI - Developed worsening renal function in the setting of shock and ATN - Creatinine improved with inotropic support.  - Stop lasix gtt. Await BMET  6.  ETOH  - Watch for withdrawal.  - On propofol  7.  Tobacco Abuse   CRITICAL CARE Performed by: Glori Bickers  Total critical care time: 35 minutes  Critical care time was exclusive of separately billable procedures and treating other patients.  Critical care was necessary to treat or prevent imminent or life-threatening deterioration.  Critical care was time spent personally by me (independent of midlevel providers or residents) on the following  activities: development of treatment plan with patient and/or surrogate as well as nursing, discussions with consultants, evaluation of patient's response to treatment, examination of patient, obtaining history from patient or surrogate, ordering and performing treatments and interventions, ordering and review of laboratory studies, ordering and review of radiographic studies, pulse oximetry and re-evaluation of patient's condition.   Length of Stay: [place], MD  09/24/2020, 4:53 AM  Advanced Heart Failure Team Pager [phone] (M-F; [place])  Please contact [facility] for night-coverage after hours (4p -7a ) and weekends on [email or web address]

## 2020-09-24 NOTE — Progress Notes (Signed)
Called to bedside for hypertension and tachycardia.  Review of telemetry and subsequent EKG with AFL w/ variable AV block rate ~ 130. Mentating well and breathing comfortably without significant complaints.  He didn't feel the arrhythmia but was quite anxious about the arrhythmia.  Discussed with him that I'd favor waiting overnight to see if this self-resolves on the amiodarone infusion prior to sedating him after his recent extubation and MSSA pneumonia.  He's agreeable.  - Continue amiodarone infusion - Ativan 1 mg x1 for anxiety - NPO @ MN for possible DCCV in AM - Increase hydralazine to 25 mg q8h and increase PRN to q4h w/ 10 mg IV for afterload reduction.  Merlyn Lot MD  Fellow

## 2020-09-25 ENCOUNTER — Inpatient Hospital Stay: Payer: Self-pay

## 2020-09-25 LAB — CBC
HCT: 41.9 % (ref 39.0–52.0)
Hemoglobin: 12.9 g/dL — ABNORMAL LOW (ref 13.0–17.0)
MCH: 32.9 pg (ref 26.0–34.0)
MCHC: 30.8 g/dL (ref 30.0–36.0)
MCV: 106.9 fL — ABNORMAL HIGH (ref 80.0–100.0)
Platelets: 449 10*3/uL — ABNORMAL HIGH (ref 150–400)
RBC: 3.92 MIL/uL — ABNORMAL LOW (ref 4.22–5.81)
RDW: 13.8 % (ref 11.5–15.5)
WBC: 8.7 10*3/uL (ref 4.0–10.5)
nRBC: 0 % (ref 0.0–0.2)

## 2020-09-25 LAB — BASIC METABOLIC PANEL
Anion gap: 10 (ref 5–15)
BUN: 34 mg/dL — ABNORMAL HIGH (ref 6–20)
CO2: 32 mmol/L (ref 22–32)
Calcium: 9.4 mg/dL (ref 8.9–10.3)
Chloride: 105 mmol/L (ref 98–111)
Creatinine, Ser: 1.28 mg/dL — ABNORMAL HIGH (ref 0.61–1.24)
GFR, Estimated: >60 mL/min (ref >60)
Glucose, Bld: 103 mg/dL — ABNORMAL HIGH (ref 70–99)
Potassium: 3.9 mmol/L (ref 3.5–5.1)
Sodium: 147 mmol/L — ABNORMAL HIGH (ref 135–145)

## 2020-09-25 LAB — COOXEMETRY PANEL
Carboxyhemoglobin: 1.2 % (ref 0.5–1.5)
Methemoglobin: 0.8 % (ref 0.0–1.5)
O2 Saturation: 63.5 %
Total hemoglobin: 13.3 g/dL (ref 12.0–16.0)

## 2020-09-25 LAB — MAGNESIUM: Magnesium: 2.4 mg/dL (ref 1.7–2.4)

## 2020-09-25 LAB — TRIGLYCERIDES: Triglycerides: 167 mg/dL — ABNORMAL HIGH (ref <150)

## 2020-09-25 LAB — PHOSPHORUS: Phosphorus: 3 mg/dL (ref 2.5–4.6)

## 2020-09-25 LAB — GLUCOSE, CAPILLARY
Glucose-Capillary: 94 mg/dL (ref 70–99)
Glucose-Capillary: 93 mg/dL (ref 70–99)
Glucose-Capillary: 106 mg/dL — ABNORMAL HIGH (ref 70–99)
Glucose-Capillary: 100 mg/dL — ABNORMAL HIGH (ref 70–99)
Glucose-Capillary: 101 mg/dL — ABNORMAL HIGH (ref 70–99)
Glucose-Capillary: 88 mg/dL (ref 70–99)

## 2020-09-25 MED ORDER — ENSURE ENLIVE PO LIQD
237.0000 mL | Freq: Two times a day (BID) | ORAL | Status: DC
  Administered 2020-09-26 – 2020-09-30 (×7): 237 mL via ORAL

## 2020-09-25 MED ORDER — HYDRALAZINE HCL 25 MG PO TABS
25.0000 mg | ORAL_TABLET | Freq: Once | ORAL | Status: AC
  Administered 2020-09-25: 25 mg via ORAL

## 2020-09-25 MED ORDER — AMIODARONE LOAD VIA INFUSION
150.0000 mg | Freq: Once | INTRAVENOUS | Status: AC
  Administered 2020-09-25: 150 mg via INTRAVENOUS

## 2020-09-25 MED ORDER — SODIUM CHLORIDE 0.9% FLUSH: 10.0000 mL | INTRAVENOUS | Status: DC | PRN

## 2020-09-25 MED ORDER — ADULT MULTIVITAMIN W/MINERALS CH
1.0000 | ORAL_TABLET | Freq: Every day | ORAL | Status: DC
  Administered 2020-09-25 – 2020-09-30 (×6): 1 via ORAL
  Filled 2020-09-25 (×6): qty 1

## 2020-09-25 MED ORDER — HEPARIN (PORCINE) 25000 UT/250ML-% IV SOLN
1800.0000 [IU]/h | INTRAVENOUS | Status: DC
  Administered 2020-09-25: 1600 [IU]/h via INTRAVENOUS
  Administered 2020-09-26: 1700 [IU]/h via INTRAVENOUS
  Administered 2020-09-26 – 2020-09-30 (×7): 1800 [IU]/h via INTRAVENOUS
  Filled 2020-09-25 (×9): qty 250

## 2020-09-25 MED ORDER — IPRATROPIUM-ALBUTEROL 0.5-2.5 (3) MG/3ML IN SOLN
3.0000 mL | Freq: Three times a day (TID) | RESPIRATORY_TRACT | Status: DC
  Administered 2020-09-25 – 2020-09-26 (×4): 3 mL via RESPIRATORY_TRACT
  Filled 2020-09-25 (×4): qty 3

## 2020-09-25 MED ORDER — HALOPERIDOL LACTATE 5 MG/ML IJ SOLN
5.0000 mg | Freq: Four times a day (QID) | INTRAMUSCULAR | Status: DC | PRN
  Administered 2020-09-25: 5 mg via INTRAVENOUS
  Filled 2020-09-25: qty 1

## 2020-09-25 MED ORDER — TORSEMIDE 20 MG PO TABS
40.0000 mg | ORAL_TABLET | Freq: Every day | ORAL | Status: DC
  Administered 2020-09-25 – 2020-09-26 (×2): 40 mg via ORAL
  Filled 2020-09-25 (×2): qty 2

## 2020-09-25 MED ORDER — SODIUM CHLORIDE 0.9% FLUSH
10.0000 mL | Freq: Two times a day (BID) | INTRAVENOUS | Status: DC
  Administered 2020-09-25 – 2020-09-29 (×5): 10 mL

## 2020-09-25 MED ORDER — HYDRALAZINE HCL 25 MG PO TABS
25.0000 mg | ORAL_TABLET | Freq: Three times a day (TID) | ORAL | Status: DC
  Administered 2020-09-25 – 2020-09-27 (×8): 25 mg via ORAL
  Filled 2020-09-25 (×10): qty 1

## 2020-09-25 NOTE — Progress Notes (Signed)
Advanced Heart Failure Rounding Note  PCP-Cardiologist: No primary care provider on file.   Subjective:    - 10/5 Admitted with A/C Sysolic HF-->Decompensated d/t SVT.  Underwent Emergent DC-CV and intubation. Placed on Norepi + milrinone+ amio drip.  - 10/7 developed component of septic shock, CXR w/ RLL infiltrate (likely aspiration). PTC 1.98. Vanc + Zosyn added. VP + NE.  - 10/9 sputum growing MSSA on Ancef - 10/9 CXR with large R effusion.Chest CT with consolidation. No effusion  - 10/11 Bronch with removal of secretions and upsize ETT -10/12 Self extubated  Remains on milrinone 0.125 mcg. CO-OX 63.5%.   Developed AFL with RVR overnight. Rates 120-140. Feels ok. Denies SOB.     Objective:   Weight Range: 121.3 kg Body mass index is 36.27 kg/m.   Vital Signs:   Temp:  [98.5 F (36.9 C)-99.8 F (37.7 C)] 98.5 F (36.9 C) (10/13 0803) Pulse Rate:  [98-148] 128 (10/13 1012) Resp:  [13-39] 30 (10/13 1012) BP: (99-192)/(70-145) 146/99 (10/13 1012) SpO2:  [87 %-100 %] 96 % (10/13 1012) Arterial Line BP: (143-211)/(75-96) 178/92 (10/13 1012) FiO2 (%):  [40 %-100 %] 100 % (10/12 1215) Weight:  [121.3 kg] 121.3 kg (10/13 0500) Last BM Date: 09/24/20  Weight change: Filed Weights   09/23/20 0500 09/24/20 0500 09/25/20 0500  Weight: 124 kg 120 kg 121.3 kg    Intake/Output:   Intake/Output Summary (Last 24 hours) at 09/25/2020 1039 Last data filed at 09/25/2020 0900 Gross per 24 hour  Intake 1018.92 ml  Output 1620 ml  Net -601.08 ml      Physical Exam   General:  Well appearing. No resp difficulty HEENT: normal Neck: supple. no JVD. Carotids 2+ bilat; no bruits. No lymphadenopathy or thryomegaly appreciated. Cor: PMI nondisplaced. Regular rate & rhythm. No rubs, gallops or murmurs. Lungs: clear Abdomen: soft, nontender, nondistended. No hepatosplenomegaly. No bruits or masses. Good bowel sounds. Extremities: no cyanosis, clubbing, rash, edema Neuro:  alert & orientedx3, cranial nerves grossly intact. moves all 4 extremities w/o difficulty. Affect pleasant   Telemetry      Labs    CBC Recent Labs    09/24/20 0341 09/24/20 0341 09/24/20 0526 09/25/20 0347  WBC 6.5  --   --  8.7  HGB 10.8*   < > 11.2* 12.9*  HCT 35.8*   < > 33.0* 41.9  MCV 106.2*  --   --  106.9*  PLT 371  --   --  449*   < > = values in this interval not displayed.   Basic Metabolic Panel Recent Labs    09/24/20 0341 09/24/20 0341 09/24/20 0526 09/25/20 0347  NA 146*   < > 148* 147*  K 4.2   < > 3.9 3.9  CL 103  --   --  105  CO2 29  --   --  32  GLUCOSE 143*  --   --  103*  BUN 51*  --   --  34*  CREATININE 1.87*  --   --  1.28*  CALCIUM 8.8*  --   --  9.4  MG 2.0  --   --  2.4  PHOS 4.5  --   --  3.0   < > = values in this interval not displayed.   Liver Function Tests Recent Labs    09/24/20 0844  AST 45*  ALT 21  ALKPHOS 51  BILITOT 0.5  PROT 7.7  ALBUMIN 2.3*   No results  for input(s): LIPASE, AMYLASE in the last 72 hours. Cardiac Enzymes No results for input(s): CKTOTAL, CKMB, CKMBINDEX, TROPONINI in the last 72 hours.  BNP: BNP (last 3 results) Recent Labs    09/17/20 0836  BNP 1,001.0*    ProBNP (last 3 results) No results for input(s): PROBNP in the last 8760 hours.   D-Dimer No results for input(s): DDIMER in the last 72 hours. Hemoglobin A1C No results for input(s): HGBA1C in the last 72 hours. Fasting Lipid Panel Recent Labs    09/25/20 0347  TRIG 167*   Thyroid Function Tests No results for input(s): TSH, T4TOTAL, T3FREE, THYROIDAB in the last 72 hours.  Invalid input(s): FREET3  Other results:   Imaging    No results found.   Medications:     Scheduled Medications: . Chlorhexidine Gluconate Cloth  6 each Topical Daily  . enoxaparin (LOVENOX) injection  60 mg Subcutaneous Q24H  . feeding supplement (ENSURE ENLIVE)  237 mL Oral BID BM  . folic acid  1 mg Oral Daily  . hydrALAZINE  25  mg Oral Q8H  . influenza vac split quadrivalent PF  0.5 mL Intramuscular Tomorrow-1000  . insulin aspart  2-6 Units Subcutaneous Q4H  . ipratropium-albuterol  3 mL Nebulization TID  . mouth rinse  15 mL Mouth Rinse BID  . multivitamin with minerals  1 tablet Oral Daily  . pantoprazole  40 mg Oral Daily  . pneumococcal 23 valent vaccine  0.5 mL Intramuscular Tomorrow-1000  . polyethylene glycol  17 g Oral BID  . senna-docusate  1 tablet Oral BID  . sodium chloride  3 mL Nebulization BID  . sodium chloride flush  10-40 mL Intracatheter Q12H  . sodium chloride flush  3 mL Intravenous Q12H  . spironolactone  25 mg Oral Daily  . thiamine  100 mg Oral Daily    Infusions: . sodium chloride Stopped (09/22/20 2203)  . sodium chloride 10 mL/hr at 09/25/20 0900  . amiodarone 30 mg/hr (09/25/20 1028)  .  ceFAZolin (ANCEF) IV Stopped (09/25/20 0700)  . milrinone 0.125 mcg/kg/min (09/25/20 0900)    PRN Medications: sodium chloride, acetaminophen, haloperidol lactate, hydrALAZINE, ondansetron (ZOFRAN) IV, sodium chloride flush, sodium chloride flush     Assessment/Plan   1. A/C Systolic HF -->Cardiogenic Shock + Septic Shock  - ECHO EF 20% with moderate RV failure. - Off norepi. Remains on milrinone 0.125 mcg. CO-OX 64%. .  - No BB with shock - Continue spiro - Once stabilized he will need cath to further evaluate.   2. Aspiration PNA with septic shock - sputum cx + MSSA - CT chest 10/9 with dense RML/RLL effusion. No effusion - Bronch 10/11 with removal of secretions and upsize ETT - continue cefazolin  3. Acute Respiratory Failure - Intubated 10/5. CCM following. - treat aspiration PNA as above - Self extubated 09/24/20. Remains on 4 liters [employer].   4. PAF - Has history of A flutter back in 2019. Off xarelto due cost for 1 year.  - develooped recurrent AFL with RVR overnight.  - increase IV amio to 60. Start heparin   5. SVT - S/p emergent cardioversion 10/5 - Continue amio  drip 30 mg per hour while on inotropes  - no change  5. AKI - Developed worsening renal function in the setting of shock and ATN - Creatinine improved with inotropic support.  - Off lasix gtt. Creatinine stable   6.  ETOH  - Watch for withdrawal.  - On propofol  7.  Tobacco Abuse    Length of Stay: [place], NP  09/25/2020, 10:39 AM  Advanced Heart Failure Team Pager [phone] (M-F; [place])  Please contact [facility] for night-coverage after hours (4p -7a ) and weekends on [email or web address]  Patient seen and examined with the above-signed Advanced Practice Provider and/or Housestaff. I personally reviewed laboratory data, imaging studies and relevant notes. I independently examined the patient and formulated the important aspects of the plan. I have edited the note to reflect any of my changes or salient points. I have personally discussed the plan with the patient and/or family.  Remains on milrinone 0.125. Co-ox 64%. Developed AFL with RVR overnight. Rates high. Off lasix gtt. Creatinine stable. Respiratory status stable after self-extubation   CVP 10 General:  Lying in bed . No resp difficulty HEENT: normal Neck: supple. RIJ TLC. JVP 10 Carotids 2+ bilat; no bruits. No lymphadenopathy or thryomegaly appreciated. Cor: PMI nondisplaced. Irregular tachy No rubs, gallops or murmurs. Lungs: crackles RLL Abdomen: soft, nontender, nondistended. No hepatosplenomegaly. No bruits or masses. Good bowel sounds. Extremities: no cyanosis, clubbing, rash, tr edema Neuro: alert & orientedx3, cranial nerves grossly intact. moves all 4 extremities w/o difficulty. Affect pleasant   Remains tenuous. Now with recurrent AFL with RVR. Bolus amio. Increase gtt rate to 60. Start heparin. Continue milrinone for now. Start po torsemide. Continue abx for RLL PNA. PT/OT to see   Glori Bickers, MD  12:58 PM

## 2020-09-25 NOTE — Progress Notes (Signed)
RT instructed patient on the use of flutter valve and incentive spirometer. Patient able to demonstrate back good technique with both devices and was able to reach 1250 mL using the incentive.

## 2020-09-25 NOTE — Progress Notes (Signed)
Peripherally Inserted Central Catheter Placement  The IV Nurse has discussed with the patient and/or persons authorized to consent for the patient, the purpose of this procedure and the potential benefits and risks involved with this procedure.  The benefits include less needle sticks, lab draws from the catheter, and the patient may be discharged home with the catheter. Risks include, but not limited to, infection, bleeding, blood clot (thrombus formation), and puncture of an artery; nerve damage and irregular heartbeat and possibility to perform a PICC exchange if needed/ordered by physician.  Alternatives to this procedure were also discussed.  Bard Power PICC patient education guide, fact sheet on infection prevention and patient information card has been provided to patient /or left at bedside.    PICC Placement Documentation  PICC Triple Lumen 09/25/20 PICC Left Cephalic 49 cm 1 cm (Active)  Indication for Insertion or Continuance of Line Vasoactive infusions 09/25/20 2236  Exposed Catheter (cm) 1 cm 09/25/20 2236  Site Assessment Clean;Dry;Intact 09/25/20 2236  Lumen #1 Status Flushed;Saline locked;Blood return noted 09/25/20 2236  Lumen #2 Status Flushed;Saline locked;Blood return noted 09/25/20 2236  Lumen #3 Status Flushed;Saline locked;Blood return noted 09/25/20 2236  Dressing Type Transparent 09/25/20 2236  Dressing Status Clean;Dry;Intact 09/25/20 2236  Antimicrobial disc in place? Yes 09/25/20 2236  Dressing Intervention New dressing 09/25/20 2236  Dressing Change Due 10/02/20 09/25/20 2236       Ethelda Chick 09/25/2020, 10:37 PM

## 2020-09-25 NOTE — Progress Notes (Signed)
PT Cancellation Note  Patient Details Name: Antonio Tapia MRN: [id number] DOB: 1981-08-24   Cancelled Treatment:    Reason Eval/Treat Not Completed: Patient not medically ready (pt currently supine with HR 142)   [name] B [name] 09/25/2020, 7:57 AM  Merryl Hacker, PT Acute Rehabilitation Services Pager: [phone] Office: [phone]

## 2020-09-25 NOTE — Progress Notes (Signed)
Lab called and made aware Lab would need to come and draw heparin level at 1900. Aline malpositioned and pt pulled out IV access. A line d/c.

## 2020-09-25 NOTE — Progress Notes (Signed)
Nutrition Follow-up  DOCUMENTATION CODES:   Obesity unspecified  INTERVENTION:    Ensure Enlive po BID, each supplement provides 350 kcal and 20 grams of protein.  MVI with minerals daily.  NUTRITION DIAGNOSIS:   Increased nutrient needs related to acute illness (acute decompensated HF) as evidenced by estimated needs.  Ongoing  GOAL:   Patient will meet greater than or equal to 90% of their needs   Progressing  MONITOR:   PO intake, Supplement acceptance  REASON FOR ASSESSMENT:   Consult Enteral/tube feeding initiation and management  ASSESSMENT:   39 yo male admitted 10/5 from HF clinic with acute decompensated HF. Required emergent cardioversion and intubation after admission. PMH includes ETOH abuse, a flutter, tobacco abuse, chronic HF, NICM.   Patient self-extubated 10/12. Currently on 4 L nasal cannula. OG tube out, TF off. Diet has been advanced to heart healthy. Meal intakes not recorded.  Labs reviewed. Sodium 148 (10/12) CBG: [phone]  Medications reviewed and include folic acid, MVI, miralax, Senokot-S, spironolactone, thiamine.   UOP 1,790 ml x 24 hours I/O -4,343 ml since admission  Currently 121.3 kg Admission weight 126.9 kg  NUTRITION - FOCUSED PHYSICAL EXAM:  unable to complete  Diet Order:   Diet Order            Diet Heart Room service appropriate? Yes; Fluid consistency: Thin; Fluid restriction: 1500 mL Fluid  Diet effective now                 EDUCATION NEEDS:   Not appropriate for education at this time  Skin:  Skin Assessment: Reviewed RN Assessment  Last BM:  10/12 type 7  Height:   Ht Readings from Last 1 Encounters:  09/17/20 6' (1.829 m)    Weight:   Wt Readings from Last 1 Encounters:  09/25/20 121.3 kg    Ideal Body Weight:  80.9 kg  BMI:  Body mass index is 36.27 kg/m.  Estimated Nutritional Needs:   Kcal:  2100-2300  Protein:  120-130 gm  Fluid:  2 L    Gabriel Rainwater, RD, LDN,  CNSC Please refer to Amion for contact information.

## 2020-09-25 NOTE — Progress Notes (Signed)
NAME:  Makayla Confer, MRN:  [id number], DOB:  January 03, 1981, LOS: 8 ADMISSION DATE:  09/17/2020, CONSULTATION DATE:  10/05 REFERRING MD:  Dr Gala Romney CHIEF COMPLAINT:  SVT   Brief History   Dalten Ambrosino is a 39 yr old male with PMH EOTH abuse, A flutter, tobacco abuse, chronic systolic heart failure admitted on 10/5 for SVT. Now in cardiogenic shock.  History of present illness   Mr. Mccravy is a 39 y/o gentleman with a history of cardiomyopathy (unknown if ischemic), permanent Afib not on AC, and chronic HFrEF who has been off GDMT x 1 year and presented to outpatient cardiology clinic today with acute decompensated heart failure.  He was direct admitted to the hospital, subsequently developed SVT with HR as high as 190 who required emergent DCCV. He was intubated for acute respiratory failure due to cardiogenic pulmonary edema and need for DCCV. He has been started on inotropes to assist with diuresis.   Past Medical History  ETOH abuse A- flutter, no on AC Tobacco abuse HFrEF HTN   Significant Hospital Events   10/5: Admitted, cardioversion, intubated  Consults:  HF   PCCM  Procedures:  10/05 >> Intubation, CVC insertion, arterial line  10/11 >> Bronchoscopy  10/12 >> Self-extubation   Significant Diagnostic Tests:  Echo in 2019 >> EF 30-35%, systolic functionmoderately to severely reduced, Diffuse hypokinesis. Right ventricular systolic presure 53 mmHg, suggesting at least moderate pulmonary hypertension. Echo 10/5 >> LVEF 40-45% with global hypokinesis, G2DD CXR 10/9 >> ETT tip terminates 3.9 cm above the carina. A RIGHT IJ CVC tip terminates over the superior cavoatrial junction. Right sided pleural effusion. Infection Vs pulmonary edema   CXR 10/11 >> Worsening bilateral pleural effusions with persistent bibasilar airspace consolidations.   Micro Data:  COVID neg  Trach aspirate 10/7 >> MSSA Blood culture 10/7 >> NGTD x 4 days   Antimicrobials:  Vanc 10/7 > 10/8 Zosyn  10/7> 10/8 Cefazolin 10/9 >   Interim history/subjective:   Yesterday afternoon, patient self-extubated. He has since been saturating around 100% on 4 L. Additionally, overnight patient converted into atrial flutter. Cardiology evaluated and increased Hydralazine with plans for possible DCCV this morning.   Mr. Kinnan states he feels bad this morning because he feels like staff do not like him. He is also feeling winded and find its unusual. He denies any chest pain or palpitations. Denies any GI concerns.   Objective   Blood pressure (!) 152/90, pulse (!) 142, temperature 98.5 F (36.9 C), temperature source Oral, resp. rate (!) 28, height 6' (1.829 m), weight 121.3 kg, SpO2 95 %. CVP:  [6 mmHg-13 mmHg] 6 mmHg  Vent Mode: PRVC FiO2 (%):  [40 %-100 %] 100 % Set Rate:  [16 bmp] 16 bmp Vt Set:  [620 mL] 620 mL PEEP:  [5 cmH20] 5 cmH20 Plateau Pressure:  [25 cmH20] 25 cmH20   Intake/Output Summary (Last 24 hours) at 09/25/2020 0729 Last data filed at 09/25/2020 0600 Gross per 24 hour  Intake 1126.27 ml  Output 1790 ml  Net -663.73 ml   Filed Weights   09/23/20 0500 09/24/20 0500 09/25/20 0500  Weight: 124 kg 120 kg 121.3 kg   Examination:  General: Ill-appearing man, alert, resting comfortably  HEENT: [place]/AT, eyes anicteric Lungs: Minimal rales on the right basilar. Clear to auscultation on the left.  Cardiovascular: Tachycardia with rates in the 140s, Regular rhythm no murmurs Abdomen: Soft, nontender, mildly distended Extremities: No clubbing or cyanosis, No extremity edema Neuro: Awake  and alert. Mildly disoriented with some visual hallucinations. Following commands. Moving all extremities.  Skin: Warm and dry.  Resolved Hospital Problem list   N/A  Assessment & Plan:   # Acute hypoxic respiratory failure # Acute MSSA pneumonia # Cardiogenic pulmonary edema - Bronchoscopy on 10/11 with thick mucus plugs on the R>L that were suctioned. - Continue Cefazolin. Given  severity of pneumonia and patient's persistent intermittent fevers, plan for 10-14 day course.  - Patient self-extubated yesterday and has been oxygenating well since on 4L. No current indications for re-intubation.  # Mixed cardiogenic and septic shock (resolving)  - Norepi has been weaned yesterday afternoon.  - Only requiring Milrinone at this time.   # Acute on chronic HFrEF # Cardiogenic shock - Heart failure following  - On Amiodarone and Milrinone gtt  - Co-ox this AM 63%.  - Strict I/Os - Unable to tolerate guideline directed heart failure therapy due to shock. As shock improves, consider starting betablocker. ARB/ACEi/ARNI contraindicated due to renal function.   # SVT s/p DCCV # Hx of Atrial Flutter - Patient converted to atrial flutter overnight with rates in the 140s-150s. Cardiology evaluated overnight and increased Hydralazine dose to reduce afterload.  - Plan for possible DCCV this morning.   # AKI # ? CKD - Unknown baseline due to lack of follow up - Differential includes pre-renal in light of heavy diuresis versus intra-renal (ATN) from shock. Differential also includes cardiorenal syndrome. BUN/Cr ratio of > 20 suggest pre-renal. Will continue to monitor closely.  - Strict I/Os - Renally dose meds and avoid nephrotoxic medications - Creatinine this morning still pending.   # Acute anemia likely due to critical illness, chronic alcohol use # Macrocytic Anemia  - Hemoglobin improving   - Transfuse for hemoglobin less than 7 or hemodynamically significant bleeding - Continue to monitor  # Elevated transaminases due to congestive hepatopathy, possibly due to ETOH use - Improving    # Tobacco abuse - Nicotine patch - Encourage smoking cessation when appropriate  # Agitated Delirium - Hallucinating throughout the night and this morning - Start Haldol 5 mg injection q6h PRN   Best practice:  Diet: NPO  Pain/Anxiety/Delirium protocol (if indicated): N/A VAP  protocol (if indicated):N/A DVT prophylaxis: Lovenox  GI prophylaxis: Protonix  Glucose control: SSI  Mobility: bed rest Code Status: FULL  Family Communication: Per Primary Disposition: ICU   Labs   CBC: Recent Labs  Lab 09/19/20 0418 09/19/20 0418 09/20/20 0442 09/20/20 0442 09/23/20 0256 09/23/20 0304 09/24/20 0341 09/24/20 0526 09/25/20 0347  WBC 10.8*  --  6.5  --  8.6  --  6.5  --  8.7  HGB 12.5*   < > 12.3*   < > 11.7* 12.2* 10.8* 11.2* 12.9*  HCT 38.0*   < > 38.4*   < > 37.0* 36.0* 35.8* 33.0* 41.9  MCV 102.2*  --  103.8*  --  104.2*  --  106.2*  --  106.9*  PLT 230  --  216  --  340  --  371  --  449*   < > = values in this interval not displayed.    Basic Metabolic Panel: Recent Labs  Lab 09/20/20 0442 09/20/20 1326 09/21/20 0300 09/21/20 0300 09/22/20 0433 09/22/20 0433 09/22/20 1720 09/23/20 0256 09/23/20 0304 09/23/20 1044 09/24/20 0341 09/24/20 0526  NA 138   < > 137   < > 143   < > 144 144 144  --  146* 148*  K 3.9   < > 3.7   < > 3.0*   < > 3.1* 4.0 3.9  --  4.2 3.9  CL 103   < > 103  --  98  --  101 100  --   --  103  --   CO2 24   < > 24  --  31  --  32 30  --   --  29  --   GLUCOSE 149*   < > 145*  --  155*  --  138* 129*  --   --  143*  --   BUN 32*   < > 35*  --  38*  --  39* 45*  --   --  51*  --   CREATININE 2.21*   < > 1.79*  --  1.64*  --  1.50* 1.65*  --   --  1.87*  --   CALCIUM 7.9*   < > 7.8*  --  7.9*  --  8.0* 8.6*  --   --  8.8*  --   MG 2.4  --  2.2  --  1.8  --   --   --   --  2.0 2.0  --   PHOS 4.4  --  3.8  --  3.3  --   --   --   --  4.5 4.5  --    < > = values in this interval not displayed.   GFR: Estimated Creatinine Clearance: 71.3 mL/min (A) (by C-G formula based on SCr of 1.87 mg/dL (H)). Recent Labs  Lab 09/19/20 0418 09/19/20 0839 09/20/20 0442 09/21/20 0300 09/23/20 0256 09/23/20 1044 09/24/20 0341 09/25/20 0347  PROCALCITON  --  1.98 1.54 1.38  --  0.90  --   --   WBC   < >  --  6.5  --  8.6  --  6.5  8.7   < > = values in this interval not displayed.    Liver Function Tests: Recent Labs  Lab 09/18/20 1230 09/24/20 0844  AST 73* 45*  ALT 59* 21  ALKPHOS 46 51  BILITOT 1.1 0.5  PROT 6.5 7.7  ALBUMIN 3.0* 2.3*   No results for input(s): LIPASE, AMYLASE in the last 168 hours. No results for input(s): AMMONIA in the last 168 hours.  ABG    Component Value Date/Time   PHART 7.488 (H) 09/24/2020 0526   PCO2ART 48.8 (H) 09/24/2020 0526   PO2ART 129 (H) 09/24/2020 0526   HCO3 36.8 (H) 09/24/2020 0526   TCO2 38 (H) 09/24/2020 0526   ACIDBASEDEF 1.0 09/17/2020 1403   O2SAT 63.5 09/25/2020 0347     Coagulation Profile: No results for input(s): INR, PROTIME in the last 168 hours.  Cardiac Enzymes: No results for input(s): CKTOTAL, CKMB, CKMBINDEX, TROPONINI in the last 168 hours.  HbA1C: Hgb A1c MFr Bld  Date/Time Value Ref Range Status  10/12/2018 03:35 PM 5.8 (H) 4.8 - 5.6 % Final    Comment:             Prediabetes: 5.7 - 6.4          Diabetes: >6.4          Glycemic control for adults with diabetes: <7.0     CBG: Recent Labs  Lab 09/24/20 1146 09/24/20 1600 09/24/20 1935 09/24/20 2344 09/25/20 0328  GLUCAP 120* 111* 107* 89 94    Dr. Verdene Lennert Internal Medicine PGY-2  Pager: [phone]  09/25/2020,  7:29 AM

## 2020-09-25 NOTE — Progress Notes (Signed)
ANTICOAGULATION CONSULT NOTE - Initial Consult  Pharmacy Consult for heparin dosing. Indication: atrial fibrillation  No Known Allergies  Patient Measurements: Height: 6' (182.9 cm) Weight: 121.3 kg (267 lb 6.7 oz) IBW/kg (Calculated) : 77.6 Heparin Dosing Weight: 104.3 kg   Vital Signs: Temp: 98.5 F (36.9 C) (10/13 1133) Temp Source: Oral (10/13 1133) BP: 114/99 (10/13 1200) Pulse Rate: 110 (10/13 1200)  Labs: Recent Labs    09/23/20 0256 09/23/20 0304 09/24/20 0341 09/24/20 0341 09/24/20 0526 09/25/20 0347  HGB 11.7*   < > 10.8*   < > 11.2* 12.9*  HCT 37.0*   < > 35.8*  --  33.0* 41.9  PLT 340  --  371  --   --  449*  CREATININE 1.65*  --  1.87*  --   --  1.28*   < > = values in this interval not displayed.    Estimated Creatinine Clearance: 104.2 mL/min (A) (by C-G formula based on SCr of 1.28 mg/dL (H)).   Medical History: Past Medical History:  Diagnosis Date  . Acute congestive heart failure (HCC)   . Alcohol use   . Atrial flutter (HCC)   . Essential hypertension   . Tobacco use     Medications:  Previously on Lovenox  0.5 mg/kg/day   Assessment: 39 y.o. male admitted with SVT and respiratory distress. PMH significant for HTN, systolic HF, and aflutter. Not on AC PTA (previously on Xarelto >1 year ago). Patient has now developed afib with rates of 120-150 on amiodarone. Pharmacy has been consulted to start IV heparin in the setting of afib.  Today, the patient's CBC is wnl with Hgb 12.9, Hct 41.9, and Plt 449. His Scr is up today at 1.87 (new baseline may be 1.5-1.6). His last dose of lovenox was at ~1730 on 10/12. Plan to start heparin gtt without bolus and check 6-hour HL tonight.    Goal of Therapy:  Heparin level 0.3-0.7 units/ml Monitor platelets by anticoagulation protocol: Yes   Plan:  Start IV heparin at 1600 units/hr Check 6-hour HL Check daily HL and CBC Monitor for s/sx of bleeding   Trixie Rude, PharmD PGY1 Acute Care Pharmacy  Resident 09/25/2020 12:39 PM  Please check [email or web address] for unit-specific pharmacy phone numbers.

## 2020-09-26 DIAGNOSIS — I480 Paroxysmal atrial fibrillation: Secondary | ICD-10-CM

## 2020-09-26 LAB — MAGNESIUM: Magnesium: 2.1 mg/dL (ref 1.7–2.4)

## 2020-09-26 LAB — CBC
HCT: 41.1 % (ref 39.0–52.0)
Hemoglobin: 12.7 g/dL — ABNORMAL LOW (ref 13.0–17.0)
MCH: 31.9 pg (ref 26.0–34.0)
MCHC: 30.9 g/dL (ref 30.0–36.0)
MCV: 103.3 fL — ABNORMAL HIGH (ref 80.0–100.0)
Platelets: 437 10*3/uL — ABNORMAL HIGH (ref 150–400)
RBC: 3.98 MIL/uL — ABNORMAL LOW (ref 4.22–5.81)
RDW: 13.4 % (ref 11.5–15.5)
WBC: 6.7 10*3/uL (ref 4.0–10.5)
nRBC: 0 % (ref 0.0–0.2)

## 2020-09-26 LAB — BASIC METABOLIC PANEL
Anion gap: 13 (ref 5–15)
BUN: 32 mg/dL — ABNORMAL HIGH (ref 6–20)
CO2: 28 mmol/L (ref 22–32)
Calcium: 8.9 mg/dL (ref 8.9–10.3)
Chloride: 101 mmol/L (ref 98–111)
Creatinine, Ser: 1.3 mg/dL — ABNORMAL HIGH (ref 0.61–1.24)
GFR, Estimated: >60 mL/min (ref >60)
Glucose, Bld: 99 mg/dL (ref 70–99)
Potassium: 3.3 mmol/L — ABNORMAL LOW (ref 3.5–5.1)
Sodium: 142 mmol/L (ref 135–145)

## 2020-09-26 LAB — COOXEMETRY PANEL
Carboxyhemoglobin: 1.3 % (ref 0.5–1.5)
Methemoglobin: 0.8 % (ref 0.0–1.5)
O2 Saturation: 60.2 %
Total hemoglobin: 14.2 g/dL (ref 12.0–16.0)

## 2020-09-26 LAB — HEPARIN LEVEL (UNFRACTIONATED)
Heparin Unfractionated: 0.23 IU/mL — ABNORMAL LOW (ref 0.30–0.70)
Heparin Unfractionated: 0.31 IU/mL (ref 0.30–0.70)

## 2020-09-26 LAB — GLUCOSE, CAPILLARY
Glucose-Capillary: 103 mg/dL — ABNORMAL HIGH (ref 70–99)
Glucose-Capillary: 103 mg/dL — ABNORMAL HIGH (ref 70–99)
Glucose-Capillary: 111 mg/dL — ABNORMAL HIGH (ref 70–99)
Glucose-Capillary: 91 mg/dL (ref 70–99)
Glucose-Capillary: 93 mg/dL (ref 70–99)
Glucose-Capillary: 98 mg/dL (ref 70–99)

## 2020-09-26 LAB — PHOSPHORUS: Phosphorus: 4.3 mg/dL (ref 2.5–4.6)

## 2020-09-26 MED ORDER — QUETIAPINE FUMARATE 50 MG PO TABS
50.0000 mg | ORAL_TABLET | Freq: Every day | ORAL | Status: DC
  Administered 2020-09-26 – 2020-09-29 (×4): 50 mg via ORAL
  Filled 2020-09-26 (×4): qty 1

## 2020-09-26 MED ORDER — SACUBITRIL-VALSARTAN 24-26 MG PO TABS
1.0000 | ORAL_TABLET | Freq: Two times a day (BID) | ORAL | Status: DC
  Filled 2020-09-26 (×2): qty 1

## 2020-09-26 MED ORDER — ISOSORBIDE MONONITRATE ER 30 MG PO TB24
30.0000 mg | ORAL_TABLET | Freq: Every day | ORAL | Status: DC
  Administered 2020-09-26 – 2020-09-30 (×5): 30 mg via ORAL
  Filled 2020-09-26 (×5): qty 1

## 2020-09-26 MED ORDER — POTASSIUM CHLORIDE CRYS ER 20 MEQ PO TBCR
60.0000 meq | EXTENDED_RELEASE_TABLET | Freq: Once | ORAL | Status: AC
  Administered 2020-09-26: 60 meq via ORAL
  Filled 2020-09-26: qty 3

## 2020-09-26 MED ORDER — IPRATROPIUM-ALBUTEROL 0.5-2.5 (3) MG/3ML IN SOLN
3.0000 mL | Freq: Two times a day (BID) | RESPIRATORY_TRACT | Status: DC
  Administered 2020-09-26: 3 mL via RESPIRATORY_TRACT
  Filled 2020-09-26 (×2): qty 3

## 2020-09-26 NOTE — Progress Notes (Signed)
CPT held due to pt sleeping 

## 2020-09-26 NOTE — Evaluation (Signed)
Physical Therapy Evaluation Patient Details Name: Antonio Tapia MRN: [id number] DOB: [birth year] Today's Date: 09/26/2020   History of Present Illness  39 yo admitted 10/5 with acute on chronic heart failure with SVT and urgent DCCV and intubated, pt with aspiration PNA, sepsis. 10/11 bronchoscopy. PMHx: cardiomyopathy, CHF, Aflutter, HTN  Clinical Impression  Pt very willing to mobilize able to stand from bed, pivot to chair then walk in hallway with RW. Pt reports living alone and being independent with girlfriend able to assist at D/C. Pt with decreased transfers, mobility and gait who will benefit from acute therapy to maximize mobility, safety and independence.   HR: 89 at rest , 101 with transfers, 112 during majority of gait and up to 140 last 40' of gait  SPO2 96% on 4L   Follow Up Recommendations Home health PT;Supervision for mobility/OOB    Equipment Recommendations  Rolling walker with 5" wheels    Recommendations for Other Services       Precautions / Restrictions Precautions Precautions: Fall      Mobility  Bed Mobility Overal bed mobility: Needs Assistance Bed Mobility: Supine to Sit     Supine to sit: Min guard     General bed mobility comments: cues to rise from supine with increased time  Transfers Overall transfer level: Needs assistance   Transfers: Sit to/from Stand Sit to Stand: Min assist         General transfer comment: min assist to rise with cues for hand placement, sequence and safety  Ambulation/Gait Ambulation/Gait assistance: Min assist Gait Distance (Feet): 190 Feet Assistive device: Rolling walker (2 wheeled) Gait Pattern/deviations: Step-through pattern;Decreased stride length   Gait velocity interpretation: 1.31 - 2.62 ft/sec, indicative of limited community ambulator General Gait Details: cues for posture, looking up and controlling RW as pt with tendency to veer right and look down +2 for assist with IV poles. Pt maintained HR  113 throughout gait until last 40' at which time HR rose to 140  Stairs            Wheelchair Mobility    Modified Rankin (Stroke Patients Only)       Balance Overall balance assessment: Mild deficits observed, not formally tested                                           Pertinent Vitals/Pain Pain Assessment: No/denies pain    Home Living Family/patient expects to be discharged to:: Private residence Living Arrangements: Alone Available Help at Discharge: Friend(s);Available 24 hours/day Type of Home: House Home Access: Stairs to enter   [employer] of Steps: 1 Home Layout: One level Home Equipment: None      Prior Function Level of Independence: Independent         Comments: pt does not work, helps his girlfriend take care of her 39 yo daughter but girlfriend can assist as needed at D/C     Hand Dominance        Extremity/Trunk Assessment   Upper Extremity Assessment Upper Extremity Assessment: Overall WFL for tasks assessed    Lower Extremity Assessment Lower Extremity Assessment: Overall WFL for tasks assessed    Cervical / Trunk Assessment Cervical / Trunk Assessment: Normal  Communication   Communication: No difficulties  Cognition Arousal/Alertness: Awake/alert Behavior During Therapy: WFL for tasks assessed/performed Overall Cognitive Status: Impaired/Different from baseline Area of Impairment: Safety/judgement  Safety/Judgement: Decreased awareness of deficits     General Comments: pt veering with gait and needing cues for RW use in standing      General Comments      Exercises     Assessment/Plan    PT Assessment Patient needs continued PT services  PT Problem List Decreased strength;Decreased mobility;Decreased safety awareness;Decreased activity tolerance;Decreased balance;Decreased knowledge of use of DME;Cardiopulmonary status limiting activity       PT  Treatment Interventions DME instruction;Therapeutic exercise;Gait training;Balance training;Stair training;Functional mobility training;Therapeutic activities;Patient/family education    PT Goals (Current goals can be found in the Care Plan section)  Acute Rehab PT Goals Patient Stated Goal: return home PT Goal Formulation: With patient Time For Goal Achievement: 10/10/20 Potential to Achieve Goals: Good    Frequency Min 3X/week   Barriers to discharge Decreased caregiver support      Co-evaluation               AM-PAC PT "6 Clicks" Mobility  Outcome Measure Help needed turning from your back to your side while in a flat bed without using bedrails?: A Little Help needed moving from lying on your back to sitting on the side of a flat bed without using bedrails?: A Little Help needed moving to and from a bed to a chair (including a wheelchair)?: A Little Help needed standing up from a chair using your arms (e.g., wheelchair or bedside chair)?: A Little Help needed to walk in hospital room?: A Little Help needed climbing 3-5 steps with a railing? : A Lot 6 Click Score: 17    End of Session Equipment Utilized During Treatment: Gait belt;Oxygen Activity Tolerance: Patient tolerated treatment well Patient left: in chair;with call bell/phone within reach;with chair alarm set;with nursing/sitter in room Nurse Communication: Mobility status PT Visit Diagnosis: Other abnormalities of gait and mobility (R26.89);Difficulty in walking, not elsewhere classified (R26.2)    Time: 6789-3810 PT Time Calculation (min) (ACUTE ONLY): 22 min   Charges:   PT Evaluation $PT Eval Moderate Complexity: 1 Mod          Jany Buckwalter P, PT Acute Rehabilitation Services Pager: [phone] Office: [phone]   Narciso Stoutenburg B Megen Madewell 09/26/2020, 1:31 PM

## 2020-09-26 NOTE — Progress Notes (Signed)
NAME:  Antonio Tapia, MRN:  [id number], DOB:  [birth year], LOS: 9 ADMISSION DATE:  09/17/2020, CONSULTATION DATE:  10/05 REFERRING MD:  Dr Gala Romney CHIEF COMPLAINT:  SVT   Brief History   Antonio Tapia is a 39 yr old male with PMH EOTH abuse, A flutter, tobacco abuse, chronic systolic heart failure admitted on 10/5 for SVT. Now in cardiogenic shock.  History of present illness   Antonio Tapia is a 39 y/o gentleman with a history of cardiomyopathy (unknown if ischemic), permanent Afib not on AC, and chronic HFrEF who has been off GDMT x 1 year and presented to outpatient cardiology clinic today with acute decompensated heart failure.  He was direct admitted to the hospital, subsequently developed SVT with HR as high as 190 who required emergent DCCV. He was intubated for acute respiratory failure due to cardiogenic pulmonary edema and need for DCCV. He has been started on inotropes to assist with diuresis.   Past Medical History  ETOH abuse A- flutter, no on AC Tobacco abuse HFrEF HTN   Significant Hospital Events   10/5: Admitted, cardioversion, intubated  Consults:  HF   PCCM  Procedures:  10/05 >> Intubation, CVC insertion, arterial line  10/11 >> Bronchoscopy  10/12 >> Self-extubation   Significant Diagnostic Tests:  Echo in 2019 >> EF 30-35%, systolic functionmoderately to severely reduced, Diffuse hypokinesis. Right ventricular systolic presure 53 mmHg, suggesting at least moderate pulmonary hypertension. Echo 10/5 >> LVEF 40-45% with global hypokinesis, G2DD CXR 10/9 >> ETT tip terminates 3.9 cm above the carina. A RIGHT IJ CVC tip terminates over the superior cavoatrial junction. Right sided pleural effusion. Infection Vs pulmonary edema   CXR 10/11 >> Worsening bilateral pleural effusions with persistent bibasilar airspace consolidations.   Micro Data:  COVID neg  Trach aspirate 10/7 >> MSSA Blood culture 10/7 >> NGTD x 4 days   Antimicrobials:  Vanc 10/7 > 10/8 Zosyn  10/7> 10/8 Cefazolin 10/9 >   Interim history/subjective:   Patient pulled out central line and peripheral lines overnight. PICC placed urgently.   Objective   Blood pressure 105/70, pulse 92, temperature 99.5 F (37.5 C), temperature source Oral, resp. rate 17, height 6' (1.829 m), weight 120.2 kg, SpO2 99 %. CVP:  [0 mmHg-13 mmHg] 11 mmHg      Intake/Output Summary (Last 24 hours) at 09/26/2020 0943 Last data filed at 09/26/2020 0900 Gross per 24 hour  Intake 2312.35 ml  Output 1955 ml  Net 357.35 ml   Filed Weights   09/24/20 0500 09/25/20 0500 09/26/20 0500  Weight: 120 kg 121.3 kg 120.2 kg   Examination:  General: Alert, resting comfortably  HEENT: [name], eyes anicteric Lungs: Clear to auscultation bilaterally.   Cardiovascular: Regular rate and rhythm. No murmurs, rubs or gallops. Abdomen: Soft, nontender, mildly distended Extremities: No clubbing or cyanosis, No extremity edema Skin: Warm and dry.  Resolved Hospital Problem list   # Mixed cardiogenic and septic shock  Assessment & Plan:   # Acute hypoxic respiratory failure: Improving  # Acute MSSA pneumonia # Cardiogenic pulmonary edema - Bronchoscopy on 10/11 with thick mucus plugs on the R>L that were suctioned. - Continue Cefazolin. Given severity of pneumonia and patient's persistent intermittent fevers, plan for 10-14 day course.  - Patient self-extubated on 10/12 and has been oxygenating well since. No current indications for re-intubation.  # Acute on chronic HFrEF # Cardiogenic shock - Heart failure following  - On Amiodarone and Milrinone gtt per HF team - Net  even over the past 24 hours off Lasix.  - Strict I/Os  # SVT s/p DCCV # Hx of Atrial Flutter - Patient converted to atrial flutter overnight from 10/12 - 10/13 with rates in the 140s-150s. - Amiodarone increased per HF.  - Stable when patient is calm.   # AKI # ? CKD - Unknown baseline due to lack of follow up - Renally dose meds and  avoid nephrotoxic medications - Stable today   # Acute anemia likely due to critical illness, chronic alcohol use # Macrocytic Anemia  - Hemoglobin stable  - Transfuse for hemoglobin less than 7 or hemodynamically significant bleeding - Continue to monitor  # Elevated transaminases due to congestive hepatopathy, possibly due to ETOH use - Improving    # Tobacco abuse - Nicotine patch - Encourage smoking cessation when appropriate  # Agitated Delirium - Continues to be agitated overnight and calm during the daytime.  - Switch Haldol to Seroquel today  Best practice:  Diet: NPO  Pain/Anxiety/Delirium protocol (if indicated): N/A VAP protocol (if indicated):N/A DVT prophylaxis: Lovenox  GI prophylaxis: Protonix  Glucose control: SSI  Mobility: bed rest Code Status: FULL  Family Communication: Per Primary Disposition: ICU   Labs   CBC: Recent Labs  Lab 09/20/20 0442 09/20/20 0442 09/23/20 0256 09/23/20 0256 09/23/20 0304 09/24/20 0341 09/24/20 0526 09/25/20 0347 09/26/20 0348  WBC 6.5  --  8.6  --   --  6.5  --  8.7 6.7  HGB 12.3*   < > 11.7*   < > 12.2* 10.8* 11.2* 12.9* 12.7*  HCT 38.4*   < > 37.0*   < > 36.0* 35.8* 33.0* 41.9 41.1  MCV 103.8*  --  104.2*  --   --  106.2*  --  106.9* 103.3*  PLT 216  --  340  --   --  371  --  449* 437*   < > = values in this interval not displayed.    Basic Metabolic Panel: Recent Labs  Lab 09/22/20 0433 09/22/20 0433 09/22/20 1720 09/22/20 1720 09/23/20 0256 09/23/20 0256 09/23/20 0304 09/23/20 1044 09/24/20 0341 09/24/20 0526 09/25/20 0347 09/26/20 0348  NA 143   < > 144   < > 144   < > 144  --  146* 148* 147* 142  K 3.0*   < > 3.1*   < > 4.0   < > 3.9  --  4.2 3.9 3.9 3.3*  CL 98   < > 101  --  100  --   --   --  103  --  105 101  CO2 31   < > 32  --  30  --   --   --  29  --  32 28  GLUCOSE 155*   < > 138*  --  129*  --   --   --  143*  --  103* 99  BUN 38*   < > 39*  --  45*  --   --   --  51*  --  34* 32*    CREATININE 1.64*   < > 1.50*  --  1.65*  --   --   --  1.87*  --  1.28* 1.30*  CALCIUM 7.9*   < > 8.0*  --  8.6*  --   --   --  8.8*  --  9.4 8.9  MG 1.8  --   --   --   --   --   --  2.0 2.0  --  2.4 2.1  PHOS 3.3  --   --   --   --   --   --  4.5 4.5  --  3.0 4.3   < > = values in this interval not displayed.   GFR: Estimated Creatinine Clearance: 102.1 mL/min (A) (by C-G formula based on SCr of 1.3 mg/dL (H)). Recent Labs  Lab 09/20/20 0442 09/20/20 0442 09/21/20 0300 09/23/20 0256 09/23/20 1044 09/24/20 0341 09/25/20 0347 09/26/20 0348  PROCALCITON 1.54  --  1.38  --  0.90  --   --   --   WBC 6.5   < >  --  8.6  --  6.5 8.7 6.7   < > = values in this interval not displayed.    Liver Function Tests: Recent Labs  Lab 09/24/20 0844  AST 45*  ALT 21  ALKPHOS 51  BILITOT 0.5  PROT 7.7  ALBUMIN 2.3*   No results for input(s): LIPASE, AMYLASE in the last 168 hours. No results for input(s): AMMONIA in the last 168 hours.  ABG    Component Value Date/Time   PHART 7.488 (H) 09/24/2020 0526   PCO2ART 48.8 (H) 09/24/2020 0526   PO2ART 129 (H) 09/24/2020 0526   HCO3 36.8 (H) 09/24/2020 0526   TCO2 38 (H) 09/24/2020 0526   ACIDBASEDEF 1.0 09/17/2020 1403   O2SAT 60.2 09/26/2020 0349     Coagulation Profile: No results for input(s): INR, PROTIME in the last 168 hours.  Cardiac Enzymes: No results for input(s): CKTOTAL, CKMB, CKMBINDEX, TROPONINI in the last 168 hours.  HbA1C: Hgb A1c MFr Bld  Date/Time Value Ref Range Status  10/12/2018 03:35 PM 5.8 (H) 4.8 - 5.6 % Final    Comment:             Prediabetes: 5.7 - 6.4          Diabetes: >6.4          Glycemic control for adults with diabetes: <7.0     CBG: Recent Labs  Lab 09/25/20 1601 09/25/20 1926 09/25/20 2324 09/26/20 0329 09/26/20 0631  GLUCAP 100* 101* 88 103* 98    Dr. Verdene Lennert Internal Medicine PGY-2  Pager: [phone]  09/26/2020, 9:43 AM

## 2020-09-26 NOTE — Progress Notes (Addendum)
ANTICOAGULATION CONSULT NOTE  Pharmacy Consult for heparin dosing. Indication: atrial fibrillation  No Known Allergies  Patient Measurements: Height: 6' (182.9 cm) Weight: 120.2 kg (264 lb 15.9 oz) IBW/kg (Calculated) : 77.6 Heparin Dosing Weight: 104.3 kg   Vital Signs: Temp: 98.8 F (37.1 C) (10/14 1105) Temp Source: Oral (10/14 1105) BP: 125/92 (10/14 1000) Pulse Rate: 94 (10/14 1100)  Labs: Recent Labs    09/24/20 0341 09/24/20 0341 09/24/20 0526 09/24/20 0526 09/25/20 0347 09/26/20 0348 09/26/20 1100  HGB 10.8*   < > 11.2*   < > 12.9* 12.7*  --   HCT 35.8*   < > 33.0*  --  41.9 41.1  --   PLT 371  --   --   --  449* 437*  --   HEPARINUNFRC  --   --   --   --   --  0.23* 0.31  CREATININE 1.87*  --   --   --  1.28* 1.30*  --    < > = values in this interval not displayed.    Estimated Creatinine Clearance: 102.1 mL/min (A) (by C-G formula based on SCr of 1.3 mg/dL (H)).   Assessment: 39 y.o. male admitted with SVT and respiratory distress. PMH significant for HTN, systolic HF, and aflutter. Not on AC PTA (previously on Xarelto >1 year ago). Patient has now developed afib with rates of 120-150 on amiodarone. Pharmacy has been consulted to start IV heparin in the setting of afib.  Overnight (10/13 ~2145) pt had pulled heparin line out, was replaced, and heparin restarted ~2330. Repeat level after restart was subtherapeutic and rate was increased at that time.   Now, 6-hour heparin level is just therapeutic at 0.31 on gtt at 1700 units/hr. No bleeding noted or issues with line per RN. CBC remains stable with Hgb 12.7, Hct 41.1, and Plt 437. Will slightly increase rate to remain within goal.    Goal of Therapy:  Heparin level 0.3-0.7 units/ml Monitor platelets by anticoagulation protocol: Yes   Plan:  Increase heparin to 1800 units/hr Check 6-hour HL Check daily HL and CBC Monitor for s/sx of bleeding  Trixie Rude, PharmD PGY1 Acute Care Pharmacy Resident   09/26/2020 11:55 AM  Please check [email or web address] for unit-specific pharmacy phone numbers.

## 2020-09-26 NOTE — Progress Notes (Signed)
ANTICOAGULATION CONSULT NOTE  Pharmacy Consult for heparin dosing. Indication: atrial fibrillation  No Known Allergies  Patient Measurements: Height: 6' (182.9 cm) Weight: 121.3 kg (267 lb 6.7 oz) IBW/kg (Calculated) : 77.6 Heparin Dosing Weight: 104.3 kg   Vital Signs: Temp: 98.4 F (36.9 C) (10/14 0331) Temp Source: Oral (10/14 0331) BP: 105/83 (10/14 0400) Pulse Rate: 136 (10/14 0400)  Labs: Recent Labs    09/24/20 0341 09/24/20 0341 09/24/20 0526 09/24/20 0526 09/25/20 0347 09/26/20 0348  HGB 10.8*   < > 11.2*   < > 12.9* 12.7*  HCT 35.8*   < > 33.0*  --  41.9 41.1  PLT 371  --   --   --  449* 437*  HEPARINUNFRC  --   --   --   --   --  0.23*  CREATININE 1.87*  --   --   --  1.28* 1.30*   < > = values in this interval not displayed.    Estimated Creatinine Clearance: 102.6 mL/min (A) (by C-G formula based on SCr of 1.3 mg/dL (H)).   Assessment: 39 y.o. male admitted with SVT and respiratory distress. PMH significant for HTN, systolic HF, and aflutter. Not on AC PTA (previously on Xarelto >1 year ago). Patient has now developed afib with rates of 120-150 on amiodarone. Pharmacy has been consulted to start IV heparin in the setting of afib.  RN called 10/13 ~2145 and pt had pulled heparin line out (1900 level was not drawn yet)- replaced and restarted ~2330.  Heparin level (drawn about 4.5 hours post restart) was subtherapeutic (0.23) on gtt at 1600 units/hr. No bleeding noted.   Goal of Therapy:  Heparin level 0.3-0.7 units/ml Monitor platelets by anticoagulation protocol: Yes   Plan:  Increase heparin to 1700 units/hr Check 6-hour HL  Christoper Fabian, PharmD, BCPS Please see amion for complete clinical pharmacist phone list 09/26/2020 4:52 AM

## 2020-09-26 NOTE — Progress Notes (Addendum)
Advanced Heart Failure Rounding Note  PCP-Cardiologist: No primary care provider on file.   Subjective:    - 10/5 Admitted with A/C Sysolic HF-->Decompensated d/t SVT.  Underwent Emergent DC-CV and intubation. Placed on Norepi + milrinone+ amio drip.  - 10/7 developed component of septic shock, CXR w/ RLL infiltrate (likely aspiration). PTC 1.98. Vanc + Zosyn added. VP + NE.  - 10/9 sputum growing MSSA on Ancef - 10/9 CXR with large R effusion.Chest CT with consolidation. No effusion  - 10/11 Bronch with removal of secretions and upsize ETT -10/12 Self extubated  Yesterday he went in A fib and was started on amio drip.   Remains on milrinone 0.125 mcg + amio drip at 60 mg per hour.    CO-OX 60%   Received haldol overnight. Drowsy this morning.    Objective:   Weight Range: 120.2 kg Body mass index is 35.94 kg/m.   Vital Signs:   Temp:  [98.4 F (36.9 C)-99.5 F (37.5 C)] 99.5 F (37.5 C) (10/14 0759) Pulse Rate:  [85-245] 92 (10/14 0900) Resp:  [16-33] 17 (10/14 0900) BP: (101-154)/(70-130) 105/70 (10/14 0900) SpO2:  [81 %-100 %] 99 % (10/14 0900) Arterial Line BP: (98-178)/(91-122) 120/95 (10/13 1611) Weight:  [120.2 kg] 120.2 kg (10/14 0500) Last BM Date: 09/25/20  Weight change: Filed Weights   09/24/20 0500 09/25/20 0500 09/26/20 0500  Weight: 120 kg 121.3 kg 120.2 kg    Intake/Output:   Intake/Output Summary (Last 24 hours) at 09/26/2020 0932 Last data filed at 09/26/2020 0900 Gross per 24 hour  Intake 2312.35 ml  Output 1955 ml  Net 357.35 ml      Physical Exam  CVP 6 General:   No resp difficulty HEENT: normal Neck: supple. JVP 5-6. Carotids 2+ bilat; no bruits. No lymphadenopathy or thryomegaly appreciated. Cor: PMI nondisplaced. Regular rate & rhythm. No rubs, gallops or murmurs. Lungs: clear on 3 liters [place].  Abdomen: soft, nontender, nondistended. No hepatosplenomegaly. No bruits or masses. Good bowel sounds. Extremities: no cyanosis,  clubbing, rash, edema Neuro: Drowsy. MAE x4.   Telemetry   In NSR this morning.   Labs    CBC Recent Labs    09/25/20 0347 09/26/20 0348  WBC 8.7 6.7  HGB 12.9* 12.7*  HCT 41.9 41.1  MCV 106.9* 103.3*  PLT 449* 836*   Basic Metabolic Panel Recent Labs    09/25/20 0347 09/26/20 0348  NA 147* 142  K 3.9 3.3*  CL 105 101  CO2 32 28  GLUCOSE 103* 99  BUN 34* 32*  CREATININE 1.28* 1.30*  CALCIUM 9.4 8.9  MG 2.4 2.1  PHOS 3.0 4.3   Liver Function Tests Recent Labs    09/24/20 0844  AST 45*  ALT 21  ALKPHOS 51  BILITOT 0.5  PROT 7.7  ALBUMIN 2.3*   No results for input(s): LIPASE, AMYLASE in the last 72 hours. Cardiac Enzymes No results for input(s): CKTOTAL, CKMB, CKMBINDEX, TROPONINI in the last 72 hours.  BNP: BNP (last 3 results) Recent Labs    09/17/20 0836  BNP 1,001.0*    ProBNP (last 3 results) No results for input(s): PROBNP in the last 8760 hours.   D-Dimer No results for input(s): DDIMER in the last 72 hours. Hemoglobin A1C No results for input(s): HGBA1C in the last 72 hours. Fasting Lipid Panel Recent Labs    09/25/20 0347  TRIG 167*   Thyroid Function Tests No results for input(s): TSH, T4TOTAL, T3FREE, THYROIDAB in the last  72 hours.  Invalid input(s): FREET3  Other results:   Imaging    [place] EKG SITE RITE  Result Date: 09/25/2020 If Site Rite image not attached, placement could not be confirmed due to current cardiac rhythm.    Medications:     Scheduled Medications: . Chlorhexidine Gluconate Cloth  6 each Topical Daily  . feeding supplement  237 mL Oral BID BM  . folic acid  1 mg Oral Daily  . hydrALAZINE  25 mg Oral Q8H  . influenza vac split quadrivalent PF  0.5 mL Intramuscular Tomorrow-1000  . insulin aspart  2-6 Units Subcutaneous Q4H  . ipratropium-albuterol  3 mL Nebulization TID  . mouth rinse  15 mL Mouth Rinse BID  . multivitamin with minerals  1 tablet Oral Daily  . pantoprazole  40 mg Oral  Daily  . pneumococcal 23 valent vaccine  0.5 mL Intramuscular Tomorrow-1000  . polyethylene glycol  17 g Oral BID  . senna-docusate  1 tablet Oral BID  . sodium chloride  3 mL Nebulization BID  . sodium chloride flush  10-40 mL Intracatheter Q12H  . sodium chloride flush  10-40 mL Intracatheter Q12H  . sodium chloride flush  3 mL Intravenous Q12H  . spironolactone  25 mg Oral Daily  . thiamine  100 mg Oral Daily  . torsemide  40 mg Oral Daily    Infusions: . sodium chloride Stopped (09/22/20 2203)  . sodium chloride 10 mL/hr at 09/26/20 0900  . amiodarone 60 mg/hr (09/26/20 0903)  .  ceFAZolin (ANCEF) IV Stopped (09/26/20 5638)  . heparin 1,700 Units/hr (09/26/20 0900)  . milrinone 0.125 mcg/kg/min (09/26/20 0900)    PRN Medications: sodium chloride, acetaminophen, haloperidol lactate, hydrALAZINE, ondansetron (ZOFRAN) IV, sodium chloride flush, sodium chloride flush, sodium chloride flush     Assessment/Plan   1. A/C Systolic HF -->Cardiogenic Shock + Septic Shock  - ECHO EF 20% with moderate RV failure. - Off norepi. CO-OX stable. Stop milrinone.  - CVP 6. Hold diuretics.   - No BB with shock - Continue spiro 25 mg daily  - Continue hydralazine 25 mg tid and add imdur 30 mg daily.  - Hold off on ARNi and SGT2 in until we determine timing of heart cath - Once stabilized he will need cath to further evaluate.  -BMET in am.   2. Aspiration PNA with septic shock - sputum cx + MSSA - CT chest 10/9 with dense RML/RLL effusion. No effusion - Bronch 10/11 with removal of secretions and upsize ETT - ON ancef until 09/29/20   3. Acute Respiratory Failure - Intubated 10/5. CCM following. - treat aspiration PNA as above - Self extubated 09/24/20. Remains on 3 liters [place].   4. PAF - Has history of A flutter back in 2019. Off xarelto due cost for 1 year.  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 Cut back amio drip to 30 mg daily. Consider switching to po  amio tomorrow.  - Continue heparin   5. SVT - S/p emergent cardioversion 10/5 - no change  5. AKI - Developed worsening renal function in the setting of shock and ATN - Creatinine improved with inotropic support.  - Creatinine now back down to 1.3.   6.  ETOH  - Watch for withdrawal.   7.  Tobacco Abuse    Length of Stay: [place], NP  09/26/2020, 9:32 AM  Advanced Heart Failure Team Pager [phone] (M-F; 7a - 4p)  Please contact [facility] for night-coverage after hours (4p -7a ) and weekends on [email or web address]  Patient seen and examined with the above-signed Advanced Practice Provider and/or Housestaff. I personally reviewed laboratory data, imaging studies and relevant notes. I independently examined the patient and formulated the important aspects of the plan. I have edited the note to reflect any of my changes or salient points. I have personally discussed the plan with the patient and/or family.  Going in/out AF on IV amio. Feeling better this am. Dyspnea improved. Co-ox 60% on milrinone 0.125. PNA improving on abx. CVP low  General:  Sitting up in bed. No resp difficulty HEENT: normal Neck: supple. no JVD. Carotids 2+ bilat; no bruits. No lymphadenopathy or thryomegaly appreciated. Cor: PMI nondisplaced. Regular rate & rhythm. No rubs, gallops or murmurs. Lungs: crackles RLL Abdomen: obese soft, nontender, nondistended. No hepatosplenomegaly. No bruits or masses. Good bowel sounds. Extremities: no cyanosis, clubbing, rash, edema Neuro: alert & orientedx3, cranial nerves grossly intact. moves all 4 extremities w/o difficulty. Affect pleasant  In/out AF. Continue IV amio and heparin. Stop milrinone. Continue spiro, hydral and nitrates. Plan R/L cath probably Monday but possibly tomorrow if improved. Attempt to mobilize. Continue abx.   Glori Bickers, MD  7:56 PM

## 2020-09-27 LAB — COOXEMETRY PANEL
Carboxyhemoglobin: 1.1 % (ref 0.5–1.5)
Methemoglobin: 0.9 % (ref 0.0–1.5)
O2 Saturation: 61.5 %
Total hemoglobin: 11.9 g/dL — ABNORMAL LOW (ref 12.0–16.0)

## 2020-09-27 LAB — CBC
HCT: 34.7 % — ABNORMAL LOW (ref 39.0–52.0)
HCT: 31.4 % — ABNORMAL LOW (ref 39.0–52.0)
Hemoglobin: 10.8 g/dL — ABNORMAL LOW (ref 13.0–17.0)
Hemoglobin: 9.7 g/dL — ABNORMAL LOW (ref 13.0–17.0)
MCH: 31.9 pg (ref 26.0–34.0)
MCH: 31.6 pg (ref 26.0–34.0)
MCHC: 31.1 g/dL (ref 30.0–36.0)
MCHC: 30.9 g/dL (ref 30.0–36.0)
MCV: 102.4 fL — ABNORMAL HIGH (ref 80.0–100.0)
MCV: 102.3 fL — ABNORMAL HIGH (ref 80.0–100.0)
Platelets: 401 10*3/uL — ABNORMAL HIGH (ref 150–400)
Platelets: 342 10*3/uL (ref 150–400)
RBC: 3.39 MIL/uL — ABNORMAL LOW (ref 4.22–5.81)
RBC: 3.07 MIL/uL — ABNORMAL LOW (ref 4.22–5.81)
RDW: 13.1 % (ref 11.5–15.5)
RDW: 13.1 % (ref 11.5–15.5)
WBC: 5.1 10*3/uL (ref 4.0–10.5)
WBC: 5 10*3/uL (ref 4.0–10.5)
nRBC: 0 % (ref 0.0–0.2)
nRBC: 0 % (ref 0.0–0.2)

## 2020-09-27 LAB — BASIC METABOLIC PANEL
Anion gap: 18 — ABNORMAL HIGH (ref 5–15)
BUN: 29 mg/dL — ABNORMAL HIGH (ref 6–20)
CO2: 26 mmol/L (ref 22–32)
Calcium: 8 mg/dL — ABNORMAL LOW (ref 8.9–10.3)
Chloride: 87 mmol/L — ABNORMAL LOW (ref 98–111)
Creatinine, Ser: 1.36 mg/dL — ABNORMAL HIGH (ref 0.61–1.24)
GFR, Estimated: >60 mL/min (ref >60)
Glucose, Bld: 447 mg/dL — ABNORMAL HIGH (ref 70–99)
Potassium: 3.1 mmol/L — ABNORMAL LOW (ref 3.5–5.1)
Sodium: 131 mmol/L — ABNORMAL LOW (ref 135–145)

## 2020-09-27 LAB — HEPARIN LEVEL (UNFRACTIONATED): Heparin Unfractionated: 0.41 IU/mL (ref 0.30–0.70)

## 2020-09-27 LAB — PHOSPHORUS: Phosphorus: 4.5 mg/dL (ref 2.5–4.6)

## 2020-09-27 LAB — GLUCOSE, CAPILLARY
Glucose-Capillary: 97 mg/dL (ref 70–99)
Glucose-Capillary: 95 mg/dL (ref 70–99)
Glucose-Capillary: 80 mg/dL (ref 70–99)
Glucose-Capillary: 117 mg/dL — ABNORMAL HIGH (ref 70–99)
Glucose-Capillary: 90 mg/dL (ref 70–99)

## 2020-09-27 LAB — MAGNESIUM: Magnesium: 2.1 mg/dL (ref 1.7–2.4)

## 2020-09-27 MED ORDER — POTASSIUM CHLORIDE CRYS ER 20 MEQ PO TBCR
40.0000 meq | EXTENDED_RELEASE_TABLET | ORAL | Status: AC
  Administered 2020-09-27 (×3): 40 meq via ORAL
  Filled 2020-09-27 (×3): qty 2

## 2020-09-27 MED ORDER — FUROSEMIDE 10 MG/ML IJ SOLN
40.0000 mg | Freq: Once | INTRAMUSCULAR | Status: AC
  Administered 2020-09-27: 40 mg via INTRAVENOUS
  Filled 2020-09-27: qty 4

## 2020-09-27 MED ORDER — HYDRALAZINE HCL 50 MG PO TABS
50.0000 mg | ORAL_TABLET | Freq: Three times a day (TID) | ORAL | Status: DC
  Administered 2020-09-27 – 2020-09-30 (×10): 50 mg via ORAL
  Filled 2020-09-27 (×10): qty 1

## 2020-09-27 MED ORDER — IPRATROPIUM-ALBUTEROL 0.5-2.5 (3) MG/3ML IN SOLN
3.0000 mL | Freq: Two times a day (BID) | RESPIRATORY_TRACT | Status: DC
  Administered 2020-09-27 – 2020-09-28 (×2): 3 mL via RESPIRATORY_TRACT
  Filled 2020-09-27 (×5): qty 3

## 2020-09-27 NOTE — Progress Notes (Addendum)
Advanced Heart Failure Rounding Note  PCP-Cardiologist: No primary care provider on file.   Subjective:    - 10/5 Admitted with A/C Sysolic HF-->Decompensated d/t SVT.  Underwent Emergent DC-CV and intubation. Placed on Norepi + milrinone+ amio drip.  - 10/7 developed component of septic shock, CXR w/ RLL infiltrate (likely aspiration). PTC 1.98. Vanc + Zosyn added. VP + NE.  - 10/9 sputum growing MSSA on Ancef - 10/9 CXR with large R effusion.Chest CT with consolidation. No effusion  - 10/11 Bronch with removal of secretions and upsize ETT -10/12 Self extubated  Milrinone stopped yesterday. Co-ox 62%. CVP 12. Wt up 5 lb.   Currently in NSR, HR 80s. On amio gtt 60/hr + heparin. Hgb down 12.7>>10.8. No overt bleeding.   OOB sitting up in chair. No complaints currently.    Objective:   Weight Range: 122.2 kg Body mass index is 36.54 kg/m.   Vital Signs:   Temp:  [98.1 F (36.7 C)-99.5 F (37.5 C)] 98.8 F (37.1 C) (10/15 0344) Pulse Rate:  [81-132] 87 (10/15 0600) Resp:  [17-30] 19 (10/15 0600) BP: (105-173)/(68-149) 148/120 (10/15 0600) SpO2:  [83 %-100 %] 100 % (10/15 0600) Weight:  [122.2 kg] 122.2 kg (10/15 0500) Last BM Date: 09/25/20  Weight change: Filed Weights   09/25/20 0500 09/26/20 0500 09/27/20 0500  Weight: 121.3 kg 120.2 kg 122.2 kg    Intake/Output:   Intake/Output Summary (Last 24 hours) at 09/27/2020 0714 Last data filed at 09/27/2020 0700 Gross per 24 hour  Intake 2188.83 ml  Output 2150 ml  Net 38.83 ml      Physical Exam   CVP 12  General:  Well appearing, moderately obese young male sitting up in chair. No respiratory difficulty HEENT: normal Neck: supple. JVD ~10 cm. Carotids 2+ bilat; no bruits. No lymphadenopathy or thyromegaly appreciated. Cor: PMI nondisplaced. Regular rate & rhythm. No rubs, gallops or murmurs. Lungs: clear Abdomen: soft, nontender, nondistended. No hepatosplenomegaly. No bruits or masses. Good bowel  sounds. Extremities: no cyanosis, clubbing, rash, edema Neuro: alert & oriented x 3, cranial nerves grossly intact. moves all 4 extremities w/o difficulty. Affect pleasant  Telemetry   NSR 80s    Labs    CBC Recent Labs    09/26/20 0348 09/27/20 0234  WBC 6.7 5.1  HGB 12.7* 10.8*  HCT 41.1 34.7*  MCV 103.3* 102.4*  PLT 437* 299*   Basic Metabolic Panel Recent Labs    09/26/20 0348 09/27/20 0234  NA 142 131*  K 3.3* 3.1*  CL 101 87*  CO2 28 26  GLUCOSE 99 447*  BUN 32* 29*  CREATININE 1.30* 1.36*  CALCIUM 8.9 8.0*  MG 2.1 2.1  PHOS 4.3 4.5   Liver Function Tests Recent Labs    09/24/20 0844  AST 45*  ALT 21  ALKPHOS 51  BILITOT 0.5  PROT 7.7  ALBUMIN 2.3*   No results for input(s): LIPASE, AMYLASE in the last 72 hours. Cardiac Enzymes No results for input(s): CKTOTAL, CKMB, CKMBINDEX, TROPONINI in the last 72 hours.  BNP: BNP (last 3 results) Recent Labs    09/17/20 0836  BNP 1,001.0*    ProBNP (last 3 results) No results for input(s): PROBNP in the last 8760 hours.   D-Dimer No results for input(s): DDIMER in the last 72 hours. Hemoglobin A1C No results for input(s): HGBA1C in the last 72 hours. Fasting Lipid Panel Recent Labs    09/25/20 0347  TRIG 167*   Thyroid Function  Tests No results for input(s): TSH, T4TOTAL, T3FREE, THYROIDAB in the last 72 hours.  Invalid input(s): FREET3  Other results:   Imaging    No results found.   Medications:     Scheduled Medications: . Chlorhexidine Gluconate Cloth  6 each Topical Daily  . feeding supplement  237 mL Oral BID BM  . folic acid  1 mg Oral Daily  . hydrALAZINE  25 mg Oral Q8H  . influenza vac split quadrivalent PF  0.5 mL Intramuscular Tomorrow-1000  . insulin aspart  2-6 Units Subcutaneous Q4H  . ipratropium-albuterol  3 mL Nebulization BID  . isosorbide mononitrate  30 mg Oral Daily  . mouth rinse  15 mL Mouth Rinse BID  . multivitamin with minerals  1 tablet Oral  Daily  . pantoprazole  40 mg Oral Daily  . pneumococcal 23 valent vaccine  0.5 mL Intramuscular Tomorrow-1000  . polyethylene glycol  17 g Oral BID  . potassium chloride  40 mEq Oral Q4H  . QUEtiapine  50 mg Oral QHS  . senna-docusate  1 tablet Oral BID  . sodium chloride  3 mL Nebulization BID  . sodium chloride flush  10-40 mL Intracatheter Q12H  . sodium chloride flush  10-40 mL Intracatheter Q12H  . sodium chloride flush  3 mL Intravenous Q12H  . spironolactone  25 mg Oral Daily  . thiamine  100 mg Oral Daily    Infusions: . sodium chloride Stopped (09/22/20 2203)  . sodium chloride Stopped (09/27/20 0023)  . amiodarone 60 mg/hr (09/27/20 0700)  .  ceFAZolin (ANCEF) IV Stopped (09/27/20 0543)  . heparin 1,800 Units/hr (09/27/20 0700)    PRN Medications: sodium chloride, acetaminophen, hydrALAZINE, ondansetron (ZOFRAN) IV, sodium chloride flush, sodium chloride flush, sodium chloride flush     Assessment/Plan   1. A/C Systolic HF -->Cardiogenic Shock + Septic Shock  - ECHO EF 20% with moderate RV failure. - Off norepi and milrinone. CO-OX 62%. - CVP 12. Give 40 IV Lasix x 1  - No BB with shock - Continue spiro 25 mg daily  - Increase hydralazine to 50 mg tid and continue imdur 30 mg daily.  - Hold off on ARNi and SGT2i until we determine timing of heart cath - Once stabilized he will need cath to further evaluate.    2. Aspiration PNA with septic shock - sputum cx + MSSA - CT chest 10/9 with dense RML/RLL effusion. No effusion - Bronch 10/11 with removal of secretions and upsize ETT - ON ancef until 09/29/20   3. Acute Respiratory Failure - Intubated 10/5. CCM following. - treat aspiration PNA as above - Self extubated 09/24/20.   4. PAF - Has history of A flutter back in 2019. Off xarelto due cost for 1 year.  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  - now off milrinone.  - reduced amio gtt to 30/hr  - Continue heparin   5. SVT -  S/p emergent cardioversion 10/5 - no change  5. AKI - Developed worsening renal function in the setting of shock and ATN - Creatinine improved with inotropic support.  - Creatinine now back down to 1.4.   6.  ETOH  - Watch for withdrawal.   7.  Tobacco Abuse  - cessation advised   8. Hypokalemia - K 3.1 - supp KCl 40 mEq q4H - follow BMP   9. Hyponatremia  - Na 131, hypervolemic hyponatremia - Lasix 40 mg x 1  10. Anemia - acute drop in Hgb 12.7>>10.8 - on heparin but no overt bleeding. Normotensive   - repeat CBC. If further drop, may need CT of A/P to r/o RPB   Length of Stay: [place], PA-C  09/27/2020, 7:14 AM  Advanced Heart Failure Team Pager [phone] (M-F; [place])  Please contact [facility] for night-coverage after hours (4p -7a ) and weekends on [email or web address]   Patient seen and examined with the above-signed Advanced Practice Provider and/or Housestaff. I personally reviewed laboratory data, imaging studies and relevant notes. I independently examined the patient and formulated the important aspects of the plan. I have edited the note to reflect any of my changes or salient points. I have personally discussed the plan with the patient and/or family.  Feels better today. Back in NSR on IV amio. No bleeding on heparin. Breathing better. No orthopnea or PND. CVP 12. Co-ox 62% off milrinone. BP high.   General:  Well appearing. No resp difficulty HEENT: normal Neck: supple. JVP to jaw . Carotids 2+ bilat; no bruits. No lymphadenopathy or thryomegaly appreciated. Cor: PMI nondisplaced. Regular rate & rhythm. No rubs, gallops or murmurs. Lungs: clear Abdomen: obese soft, nontender, nondistended. No hepatosplenomegaly. No bruits or masses. Good bowel sounds. Extremities: no cyanosis, clubbing, rash, edema Neuro: alert & orientedx3, cranial nerves grossly intact. moves all 4 extremities w/o difficulty. Affect pleasant  Improving steadily. Agree with  IV lasix. Will add Entresto. Can go out of ICU. Plan R/L cath on Monday.   Glori Bickers, MD  6:39 PM

## 2020-09-27 NOTE — Progress Notes (Signed)
ANTICOAGULATION CONSULT NOTE  Pharmacy Consult for heparin dosing. Indication: atrial fibrillation  No Known Allergies  Patient Measurements: Height: 6' (182.9 cm) Weight: 122.2 kg (269 lb 6.4 oz) IBW/kg (Calculated) : 77.6 Heparin Dosing Weight: 104.3 kg   Vital Signs: Temp: 98 F (36.7 C) (10/15 0731) Temp Source: Axillary (10/15 0344) BP: 152/96 (10/15 0700) Pulse Rate: 83 (10/15 0700)  Labs: Recent Labs    09/25/20 0347 09/25/20 0347 09/26/20 0348 09/26/20 1100 09/27/20 0234  HGB 12.9*   < > 12.7*  --  10.8*  HCT 41.9  --  41.1  --  34.7*  PLT 449*  --  437*  --  401*  HEPARINUNFRC  --   --  0.23* 0.31 0.41  CREATININE 1.28*  --  1.30*  --  1.36*   < > = values in this interval not displayed.    Estimated Creatinine Clearance: 98.4 mL/min (A) (by C-G formula based on SCr of 1.36 mg/dL (H)).   Assessment: 39 y.o. male admitted with SVT and respiratory distress. PMH significant for HTN, systolic HF, and aflutter. Not on AC PTA (previously on Xarelto >1 year ago). Patient has now developed afib with rates of 120-150 on amiodarone. Pharmacy has been consulted to start IV heparin in the setting of afib.  Overnight (10/13 ~2145) pt had pulled heparin line out, was replaced, and heparin restarted ~2330. Repeat level after restart was subtherapeutic and rate was increased at that time.   Today, heparin level is therapeutic at 0.41 on gtt at 1800 units/hr. CBC slightly down from yesterday with Hgb 10.8 and Plt 40. No overt bleeding or issues with line noted. Will continue heparin at current rate.   Goal of Therapy:  Heparin level 0.3-0.7 units/ml Monitor platelets by anticoagulation protocol: Yes   Plan:  Continue IV heparin at 1800 units/hr Repeat CBC this afternoon  Check daily HL and CBC Monitor for s/sx of bleeding  Trixie Rude, PharmD PGY1 Acute Care Pharmacy Resident  09/27/2020 10:02 AM  Please check [email or web address] for unit-specific pharmacy phone  numbers.

## 2020-09-27 NOTE — Progress Notes (Addendum)
Cards MD on call notified for potassium 3.1, referred orders to be given by dayshift

## 2020-09-27 NOTE — Progress Notes (Signed)
Physical Therapy Treatment Patient Details Name: Antonio Tapia MRN: [id number] DOB: 02/28/1981 Today's Date: 09/27/2020    History of Present Illness 39 yo admitted 10/5 with acute on chronic heart failure with SVT and urgent DCCV and intubated, pt with aspiration PNA, sepsis. 10/11 bronchoscopy. PMHx: cardiomyopathy, CHF, Aflutter, HTN    PT Comments    Pt in chair on arrival with significantly improved transfers, gait and activity tolerance. Pt with condom cath coming off end of session with voiding with pt able to perform lower body bathing in sitting without assist with noted SOB during this activity with drop to 87%. Pt encouraged to continue mobility and walking with nursing and plan remains appropriate.   HR 94 with gait   Follow Up Recommendations  Home health PT     Equipment Recommendations  Rolling walker with 5" wheels    Recommendations for Other Services       Precautions / Restrictions Precautions Precautions: Fall    Mobility  Bed Mobility Overal bed mobility: Needs Assistance Bed Mobility: Sit to Supine     Supine to sit: Min guard     General bed mobility comments: guarding for lines with pt able to return to bed without physical assist  Transfers Overall transfer level: Needs assistance   Transfers: Sit to/from Stand Sit to Stand: Min guard         General transfer comment: cues for hand placement with assist for lines  Ambulation/Gait Ambulation/Gait assistance: Min guard Gait Distance (Feet): 400 Feet Assistive device: Rolling walker (2 wheeled) Gait Pattern/deviations: Step-through pattern;Decreased stride length   Gait velocity interpretation: 1.31 - 2.62 ft/sec, indicative of limited community ambulator General Gait Details: cues for posture, breathing and RW control. Pt with SpO2 92% on RA with gait with drop to 87% after return to room and need for lower body bathing with noted feeling SOB at that time   Stairs              Wheelchair Mobility    Modified Rankin (Stroke Patients Only)       Balance Overall balance assessment: Mild deficits observed, not formally tested                                          Cognition Arousal/Alertness: Awake/alert Behavior During Therapy: WFL for tasks assessed/performed Overall Cognitive Status: Within Functional Limits for tasks assessed                                        Exercises      General Comments        Pertinent Vitals/Pain Pain Assessment: No/denies pain    Home Living                      Prior Function            PT Goals (current goals can now be found in the care plan section) Progress towards PT goals: Progressing toward goals    Frequency    Min 3X/week      PT Plan Current plan remains appropriate    Co-evaluation              AM-PAC PT "6 Clicks" Mobility   Outcome Measure  Help needed turning from your back to your  side while in a flat bed without using bedrails?: A Little Help needed moving from lying on your back to sitting on the side of a flat bed without using bedrails?: A Little Help needed moving to and from a bed to a chair (including a wheelchair)?: A Little Help needed standing up from a chair using your arms (e.g., wheelchair or bedside chair)?: A Little Help needed to walk in hospital room?: A Little Help needed climbing 3-5 steps with a railing? : A Little 6 Click Score: 18    End of Session Equipment Utilized During Treatment: Gait belt;Oxygen Activity Tolerance: Patient tolerated treatment well Patient left: in bed;with call bell/phone within reach;with nursing/sitter in room Nurse Communication: Mobility status PT Visit Diagnosis: Other abnormalities of gait and mobility (R26.89);Difficulty in walking, not elsewhere classified (R26.2)     Time: 1027-2536 PT Time Calculation (min) (ACUTE ONLY): 28 min  Charges:  $Gait Training: 8-22  mins $Therapeutic Activity: 8-22 mins                     Bora Bost P, PT Acute Rehabilitation Services Pager: [phone] Office: [phone]    Gillie Fleites B Dayane [place] 09/27/2020, 12:40 PM

## 2020-09-28 LAB — CBC
HCT: 37.9 % — ABNORMAL LOW (ref 39.0–52.0)
Hemoglobin: 12.2 g/dL — ABNORMAL LOW (ref 13.0–17.0)
MCH: 31.9 pg (ref 26.0–34.0)
MCHC: 32.2 g/dL (ref 30.0–36.0)
MCV: 99.2 fL (ref 80.0–100.0)
Platelets: 406 10*3/uL — ABNORMAL HIGH (ref 150–400)
RBC: 3.82 MIL/uL — ABNORMAL LOW (ref 4.22–5.81)
RDW: 13.1 % (ref 11.5–15.5)
WBC: 5.3 10*3/uL (ref 4.0–10.5)
nRBC: 0 % (ref 0.0–0.2)

## 2020-09-28 LAB — GLUCOSE, CAPILLARY
Glucose-Capillary: 102 mg/dL — ABNORMAL HIGH (ref 70–99)
Glucose-Capillary: 105 mg/dL — ABNORMAL HIGH (ref 70–99)
Glucose-Capillary: 123 mg/dL — ABNORMAL HIGH (ref 70–99)
Glucose-Capillary: 84 mg/dL (ref 70–99)
Glucose-Capillary: 92 mg/dL (ref 70–99)
Glucose-Capillary: 95 mg/dL (ref 70–99)

## 2020-09-28 LAB — BASIC METABOLIC PANEL
Anion gap: 10 (ref 5–15)
BUN: 25 mg/dL — ABNORMAL HIGH (ref 6–20)
CO2: 25 mmol/L (ref 22–32)
Calcium: 8.5 mg/dL — ABNORMAL LOW (ref 8.9–10.3)
Chloride: 101 mmol/L (ref 98–111)
Creatinine, Ser: 1.21 mg/dL (ref 0.61–1.24)
GFR, Estimated: >60 mL/min (ref >60)
Glucose, Bld: 171 mg/dL — ABNORMAL HIGH (ref 70–99)
Potassium: 4 mmol/L (ref 3.5–5.1)
Sodium: 136 mmol/L (ref 135–145)

## 2020-09-28 LAB — PHOSPHORUS: Phosphorus: 3.9 mg/dL (ref 2.5–4.6)

## 2020-09-28 LAB — COOXEMETRY PANEL
Carboxyhemoglobin: 1.1 % (ref 0.5–1.5)
Methemoglobin: 0.8 % (ref 0.0–1.5)
O2 Saturation: 63.5 %
Total hemoglobin: 13.4 g/dL (ref 12.0–16.0)

## 2020-09-28 LAB — MAGNESIUM: Magnesium: 2.2 mg/dL (ref 1.7–2.4)

## 2020-09-28 LAB — HEPARIN LEVEL (UNFRACTIONATED): Heparin Unfractionated: 0.63 IU/mL (ref 0.30–0.70)

## 2020-09-28 MED ORDER — SACUBITRIL-VALSARTAN 24-26 MG PO TABS
1.0000 | ORAL_TABLET | Freq: Two times a day (BID) | ORAL | Status: DC
  Administered 2020-09-28 – 2020-09-29 (×3): 1 via ORAL
  Filled 2020-09-28 (×3): qty 1

## 2020-09-28 NOTE — Progress Notes (Signed)
ANTICOAGULATION CONSULT NOTE  Pharmacy Consult for heparin dosing. Indication: atrial fibrillation  No Known Allergies  Patient Measurements: Height: 6' 2.76" (189.9 cm) Weight: 121.7 kg (268 lb 4.8 oz) IBW/kg (Calculated) : 83.96 Heparin Dosing Weight: 104.3 kg   Vital Signs: Temp: 98.3 F (36.8 C) (10/16 1137) Temp Source: Oral (10/16 1137) BP: 159/78 (10/16 1137) Pulse Rate: 94 (10/16 1137)  Labs: Recent Labs    09/26/20 0348 09/26/20 0348 09/26/20 1100 09/27/20 0234 09/27/20 0234 09/27/20 1522 09/28/20 0348 09/28/20 0930  HGB 12.7*   < >  --  10.8*   < > 9.7*  --  12.2*  HCT 41.1   < >  --  34.7*  --  31.4*  --  37.9*  PLT 437*   < >  --  401*  --  342  --  406*  HEPARINUNFRC 0.23*   < > 0.31 0.41  --   --  0.63  --   CREATININE 1.30*  --   --  1.36*  --   --   --  1.21   < > = values in this interval not displayed.    Estimated Creatinine Clearance: 114.9 mL/min (by C-G formula based on SCr of 1.21 mg/dL).   Assessment: 39 y.o. male admitted with SVT and respiratory distress. PMH significant for HTN, systolic HF, and aflutter. Not on AC PTA (previously on Xarelto >1 year ago). Patient has now developed afib with rates of 120-150 on amiodarone. Pharmacy has been consulted to start IV heparin in the setting of afib.  Heparin level therapeutic at 0.63 on drip rate 1800 units/hr. Hgb up 12.2, plts 406. No overt bleeding noted. Plans noted for [facility] on Monday.  Goal of Therapy:  Heparin level 0.3-0.7 units/ml Monitor platelets by anticoagulation protocol: Yes   Plan:  Continue IV heparin at 1800 units/hr Check daily HL and CBC Monitor for s/sx of bleeding F/u transition to PO [facility] post-cath  Tama Headings, PharmD PGY2 Cardiology Pharmacy Resident Phone: [phone] 09/28/2020  3:16 PM  Please check [email or web address] for unit-specific pharmacy phone numbers.

## 2020-09-28 NOTE — Progress Notes (Signed)
Report called to Ranaa, RN on 2C.  Patient will be moved to room 17.

## 2020-09-28 NOTE — Progress Notes (Signed)
Pt was having a nap this evening and while he got up and tried to go to bathroom, he accidentally pulled out IV line where heparin was infusing and all heparin was spilled on the floor. RN talked with Pharmacist Windell Moulding and waiting for a new bag to hang. It happened at 6.12 pm and notified at 6.18 pm. Pt is stable, he just said he needed to go bathroom quick and it happened accidentally. Will continue to monitor  Lonia Farber, RN

## 2020-09-28 NOTE — Progress Notes (Signed)
Patient transferred to room 17 on 2C by wheelchair.  Ranaa, RN at bedside. Patient alert with no distress noted.

## 2020-09-28 NOTE — Progress Notes (Signed)
Advanced Heart Failure Rounding Note  PCP-Cardiologist: No primary care provider on file.   Subjective:    - 10/5 Admitted with A/C Sysolic HF-->Decompensated d/t SVT.  Underwent Emergent DC-CV and intubation. Placed on Norepi + milrinone+ amio drip.  - 10/7 developed component of septic shock, CXR w/ RLL infiltrate (likely aspiration). PTC 1.98. Vanc + Zosyn added. VP + NE.  - 10/9 sputum growing MSSA on Ancef - 10/9 CXR with large R effusion.Chest CT with consolidation. No effusion  - 10/11 Bronch with removal of secretions and upsize ETT -10/12 Self extubated  Milrinone stopped 10/14 Co-ox 64%. CVP 7.   Feels better today. Denies SOB, orthopnea or PND.   Objective:   Weight Range: 121.7 kg Body mass index is 33.75 kg/m.   Vital Signs:   Temp:  [98.1 F (36.7 C)-98.7 F (37.1 C)] 98.3 F (36.8 C) (10/16 1137) Pulse Rate:  [79-94] 94 (10/16 1137) Resp:  [20-29] 20 (10/16 1137) BP: (115-164)/(73-106) 159/78 (10/16 1137) SpO2:  [90 %-98 %] 92 % (10/16 1137) Weight:  [121.7 kg] 121.7 kg (10/16 0428) Last BM Date: 09/27/20  Weight change: Filed Weights   09/26/20 0500 09/27/20 0500 09/28/20 0428  Weight: 120.2 kg 122.2 kg 121.7 kg    Intake/Output:   Intake/Output Summary (Last 24 hours) at 09/28/2020 1307 Last data filed at 09/28/2020 0917 Gross per 24 hour  Intake 1197.98 ml  Output 1100 ml  Net 97.98 ml      Physical Exam   CVP 7 General:  Well appearing. No resp difficulty HEENT: normal Neck: supple. no JVD. Carotids 2+ bilat; no bruits. No lymphadenopathy or thryomegaly appreciated. Cor: PMI nondisplaced. Regular rate & rhythm. No rubs, gallops or murmurs. Lungs: clear Abdomen: obese soft, nontender, nondistended. No hepatosplenomegaly. No bruits or masses. Good bowel sounds. Extremities: no cyanosis, clubbing, rash, edema Neuro: alert & orientedx3, cranial nerves grossly intact. moves all 4 extremities w/o difficulty. Affect  pleasant   Telemetry   NSR 80-90s Personally reviewed     Labs    CBC Recent Labs    09/27/20 1522 09/28/20 0930  WBC 5.0 5.3  HGB 9.7* 12.2*  HCT 31.4* 37.9*  MCV 102.3* 99.2  PLT 342 203*   Basic Metabolic Panel Recent Labs    09/27/20 0234 09/28/20 0348 09/28/20 0930  NA 131*  --  136  K 3.1*  --  4.0  CL 87*  --  101  CO2 26  --  25  GLUCOSE 447*  --  171*  BUN 29*  --  25*  CREATININE 1.36*  --  1.21  CALCIUM 8.0*  --  8.5*  MG 2.1 2.2  --   PHOS 4.5 3.9  --    Liver Function Tests No results for input(s): AST, ALT, ALKPHOS, BILITOT, PROT, ALBUMIN in the last 72 hours. No results for input(s): LIPASE, AMYLASE in the last 72 hours. Cardiac Enzymes No results for input(s): CKTOTAL, CKMB, CKMBINDEX, TROPONINI in the last 72 hours.  BNP: BNP (last 3 results) Recent Labs    09/17/20 0836  BNP 1,001.0*    ProBNP (last 3 results) No results for input(s): PROBNP in the last 8760 hours.   D-Dimer No results for input(s): DDIMER in the last 72 hours. Hemoglobin A1C No results for input(s): HGBA1C in the last 72 hours. Fasting Lipid Panel No results for input(s): CHOL, HDL, LDLCALC, TRIG, CHOLHDL, LDLDIRECT in the last 72 hours. Thyroid Function Tests No results for input(s): TSH, T4TOTAL, T3FREE,  THYROIDAB in the last 72 hours.  Invalid input(s): FREET3  Other results:   Imaging    No results found.   Medications:     Scheduled Medications: . Chlorhexidine Gluconate Cloth  6 each Topical Daily  . feeding supplement  237 mL Oral BID BM  . folic acid  1 mg Oral Daily  . hydrALAZINE  50 mg Oral Q8H  . influenza vac split quadrivalent PF  0.5 mL Intramuscular Tomorrow-1000  . insulin aspart  2-6 Units Subcutaneous Q4H  . ipratropium-albuterol  3 mL Nebulization BID  . isosorbide mononitrate  30 mg Oral Daily  . mouth rinse  15 mL Mouth Rinse BID  . multivitamin with minerals  1 tablet Oral Daily  . pantoprazole  40 mg Oral Daily  .  pneumococcal 23 valent vaccine  0.5 mL Intramuscular Tomorrow-1000  . polyethylene glycol  17 g Oral BID  . QUEtiapine  50 mg Oral QHS  . senna-docusate  1 tablet Oral BID  . sodium chloride  3 mL Nebulization BID  . sodium chloride flush  10-40 mL Intracatheter Q12H  . sodium chloride flush  10-40 mL Intracatheter Q12H  . sodium chloride flush  3 mL Intravenous Q12H  . spironolactone  25 mg Oral Daily  . thiamine  100 mg Oral Daily    Infusions: . sodium chloride Stopped (09/22/20 2203)  . sodium chloride Stopped (09/27/20 0023)  . amiodarone 30 mg/hr (09/28/20 0917)  .  ceFAZolin (ANCEF) IV 2 g (09/28/20 0881)  . heparin 1,800 Units/hr (09/28/20 0352)    PRN Medications: sodium chloride, acetaminophen, hydrALAZINE, ondansetron (ZOFRAN) IV, sodium chloride flush, sodium chloride flush, sodium chloride flush   Assessment/Plan   1. A/C Systolic HF -->Cardiogenic Shock + Septic Shock  - ECHO EF 20% with moderate RV failure. - Off norepi and milrinone. Co-ox 64%. - Volume status looks good. CVP 7 - No BB with shock - Continue spiro 25 mg daily  - Continue hydralazine 50 mg tid and imdur 30 mg daily.  - Start Entresto 24/26 bid  - R/L Cath Monday  2. Aspiration PNA with septic shock - sputum cx + MSSA - CT chest 10/9 with dense RML/RLL effusion. No effusion - Bronch 10/11 with removal of secretions and upsize ETT - On ancef until 09/29/20   3. Acute Respiratory Failure - Intubated 10/5. CCM following. - treat aspiration PNA as above - Self extubated 09/24/20.  - stable on [place]  4. PAF - Has history of A flutter back in 2019. Off xarelto due cost for 1 year.  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 On amio gtt  30/hr  - Continue amio gtt one more day. Switch to po tomorrow  - Continue heparin up to cath. Switch to Eliquis after cath on Monday  5. SVT - S/p emergent cardioversion 10/5 - no change  5. AKI - Developed worsening renal function in  the setting of shock and ATN - Creatinine improved with inotropic support.  - Creatinine now back down to 1.2.   6.  ETOH  - aware of need for cessation  7.  Tobacco Abuse  - cessation advised   8. Hypokalemia - K 4.0 - follow BMET  9. Hyponatremia  - Na 136   Length of Stay: 11   Glori Bickers, MD  09/28/2020, 1:07 PM  Advanced Heart Failure Team Pager [phone] (M-F; 7a - 4p)  Please contact [facility] for night-coverage after hours (4p -7a )  and weekends on [email or web address]

## 2020-09-29 LAB — BASIC METABOLIC PANEL
Anion gap: 9 (ref 5–15)
BUN: 20 mg/dL (ref 6–20)
CO2: 22 mmol/L (ref 22–32)
Calcium: 8.8 mg/dL — ABNORMAL LOW (ref 8.9–10.3)
Chloride: 105 mmol/L (ref 98–111)
Creatinine, Ser: 1.15 mg/dL (ref 0.61–1.24)
GFR, Estimated: >60 mL/min (ref >60)
Glucose, Bld: 93 mg/dL (ref 70–99)
Potassium: 4 mmol/L (ref 3.5–5.1)
Sodium: 136 mmol/L (ref 135–145)

## 2020-09-29 LAB — PHOSPHORUS: Phosphorus: 3.5 mg/dL (ref 2.5–4.6)

## 2020-09-29 LAB — HEPARIN LEVEL (UNFRACTIONATED): Heparin Unfractionated: 0.46 IU/mL (ref 0.30–0.70)

## 2020-09-29 LAB — CBC
HCT: 40.9 % (ref 39.0–52.0)
Hemoglobin: 13.5 g/dL (ref 13.0–17.0)
MCH: 32.1 pg (ref 26.0–34.0)
MCHC: 33 g/dL (ref 30.0–36.0)
MCV: 97.4 fL (ref 80.0–100.0)
Platelets: 411 10*3/uL — ABNORMAL HIGH (ref 150–400)
RBC: 4.2 MIL/uL — ABNORMAL LOW (ref 4.22–5.81)
RDW: 12.8 % (ref 11.5–15.5)
WBC: 4.4 10*3/uL (ref 4.0–10.5)
nRBC: 0 % (ref 0.0–0.2)

## 2020-09-29 LAB — GLUCOSE, CAPILLARY
Glucose-Capillary: 99 mg/dL (ref 70–99)
Glucose-Capillary: 93 mg/dL (ref 70–99)
Glucose-Capillary: 77 mg/dL (ref 70–99)
Glucose-Capillary: 102 mg/dL — ABNORMAL HIGH (ref 70–99)

## 2020-09-29 LAB — COOXEMETRY PANEL
Carboxyhemoglobin: 0.9 % (ref 0.5–1.5)
Methemoglobin: 0.6 % (ref 0.0–1.5)
O2 Saturation: 68.2 %
Total hemoglobin: 15.6 g/dL (ref 12.0–16.0)

## 2020-09-29 LAB — MAGNESIUM: Magnesium: 2.2 mg/dL (ref 1.7–2.4)

## 2020-09-29 MED ORDER — SACUBITRIL-VALSARTAN 97-103 MG PO TABS
1.0000 | ORAL_TABLET | Freq: Two times a day (BID) | ORAL | Status: DC
  Filled 2020-09-29 (×2): qty 1

## 2020-09-29 MED ORDER — SODIUM CHLORIDE 0.9% FLUSH
3.0000 mL | Freq: Two times a day (BID) | INTRAVENOUS | Status: DC
  Administered 2020-09-29: 3 mL via INTRAVENOUS

## 2020-09-29 MED ORDER — SODIUM CHLORIDE 0.9% FLUSH
3.0000 mL | INTRAVENOUS | Status: DC | PRN
  Administered 2020-09-29: 3 mL via INTRAVENOUS

## 2020-09-29 MED ORDER — ASPIRIN 81 MG PO CHEW
81.0000 mg | CHEWABLE_TABLET | ORAL | Status: AC
  Administered 2020-09-30: 81 mg via ORAL

## 2020-09-29 MED ORDER — SODIUM CHLORIDE 0.9 % IV SOLN: 250.0000 mL | INTRAVENOUS | Status: DC | PRN

## 2020-09-29 MED ORDER — CARVEDILOL 3.125 MG PO TABS
3.1250 mg | ORAL_TABLET | Freq: Two times a day (BID) | ORAL | Status: DC
  Administered 2020-09-29 – 2020-09-30 (×2): 3.125 mg via ORAL
  Filled 2020-09-29 (×2): qty 1

## 2020-09-29 MED ORDER — SACUBITRIL-VALSARTAN 24-26 MG PO TABS: 1.0000 | ORAL_TABLET | Freq: Two times a day (BID) | ORAL | Status: DC

## 2020-09-29 MED ORDER — AMIODARONE HCL 200 MG PO TABS
200.0000 mg | ORAL_TABLET | Freq: Two times a day (BID) | ORAL | Status: DC
  Administered 2020-09-29 – 2020-09-30 (×3): 200 mg via ORAL
  Filled 2020-09-29 (×4): qty 1

## 2020-09-29 MED ORDER — SACUBITRIL-VALSARTAN 97-103 MG PO TABS
1.0000 | ORAL_TABLET | Freq: Two times a day (BID) | ORAL | Status: DC
  Administered 2020-09-29 – 2020-09-30 (×2): 1 via ORAL
  Filled 2020-09-29 (×3): qty 1

## 2020-09-29 MED ORDER — SODIUM CHLORIDE 0.9 % IV SOLN: INTRAVENOUS | Status: DC

## 2020-09-29 NOTE — Progress Notes (Signed)
Pt's BP was high throughout the day until this evening, covered by PO and IV meds for HTN throughout the day, Consent is taken for surgery tomorrow and pt is aware of NPO status since midnight. IV heparin is continue @18  cc/hr. Will continue to monitor the patient  , RN

## 2020-09-29 NOTE — H&P (View-Only) (Signed)
  Advanced Heart Failure Rounding Note  PCP-Cardiologist: No primary care provider on file.   Subjective:    - 10/5 Admitted with A/C Sysolic HF-->Decompensated d/t SVT.  Underwent Emergent DC-CV and intubation. Placed on Norepi + milrinone+ amio drip.  - 10/7 developed component of septic shock, CXR w/ RLL infiltrate (likely aspiration). PTC 1.98. Vanc + Zosyn added. VP + NE.  - 10/9 sputum growing MSSA on Ancef - 10/9 CXR with large R effusion.Chest CT with consolidation. No effusion  - 10/11 Bronch with removal of secretions and upsize ETT -10/12 Self extubated  Milrinone stopped 10/14 Co-ox 68%. CVP 8-9. SBP 150-170  Doing well. Denies SOB, orthopnea or PND. Walking halls. Maintaining NSR.   Objective:   Weight Range: 120.8 kg Body mass index is 33.5 kg/m.   Vital Signs:   Temp:  [97.8 F (36.6 C)-98.5 F (36.9 C)] 98.3 F (36.8 C) (10/17 1200) Pulse Rate:  [87-95] 87 (10/17 1200) Resp:  [20-25] 20 (10/17 1200) BP: (137-176)/(90-127) 155/127 (10/17 1200) SpO2:  [98 %-100 %] 100 % (10/17 1200) Weight:  [120.8 kg] 120.8 kg (10/17 0414) Last BM Date: 09/28/20  Weight change: Filed Weights   09/27/20 0500 09/28/20 0428 09/29/20 0414  Weight: 122.2 kg 121.7 kg 120.8 kg    Intake/Output:   Intake/Output Summary (Last 24 hours) at 09/29/2020 1405 Last data filed at 09/29/2020 1200 Gross per 24 hour  Intake 680 ml  Output 1450 ml  Net -770 ml      Physical Exam   General:  Well appearing. No resp difficulty HEENT: normal Neck: supple. JVP 8  Carotids 2+ bilat; no bruits. No lymphadenopathy or thryomegaly appreciated. Cor: PMI nondisplaced. Regular rate & rhythm. No rubs, gallops or murmurs. Lungs: clear x crackles in RLL Abdomen: obese  soft, nontender, nondistended. No hepatosplenomegaly. No bruits or masses. Good bowel sounds. Extremities: no cyanosis, clubbing, rash, edema Neuro: alert & orientedx3, cranial nerves grossly intact. moves all 4 extremities  w/o difficulty. Affect pleasant    Telemetry   NSR 80s Personally reviewed     Labs    CBC Recent Labs    09/28/20 0930 09/29/20 0046  WBC 5.3 4.4  HGB 12.2* 13.5  HCT 37.9* 40.9  MCV 99.2 97.4  PLT 406* 411*   Basic Metabolic Panel Recent Labs    09/27/20 0234 09/28/20 0348 09/28/20 0930 09/29/20 0046  NA   < >  --  136 136  K   < >  --  4.0 4.0  CL   < >  --  101 105  CO2   < >  --  25 22  GLUCOSE   < >  --  171* 93  BUN   < >  --  25* 20  CREATININE   < >  --  1.21 1.15  CALCIUM   < >  --  8.5* 8.8*  MG  --  2.2  --  2.2  PHOS  --  3.9  --  3.5   < > = values in this interval not displayed.   Liver Function Tests No results for input(s): AST, ALT, ALKPHOS, BILITOT, PROT, ALBUMIN in the last 72 hours. No results for input(s): LIPASE, AMYLASE in the last 72 hours. Cardiac Enzymes No results for input(s): CKTOTAL, CKMB, CKMBINDEX, TROPONINI in the last 72 hours.  BNP: BNP (last 3 results) Recent Labs    09/17/20 0836  BNP 1,001.0*    ProBNP (last 3 results) No results for   input(s): PROBNP in the last 8760 hours.   D-Dimer No results for input(s): DDIMER in the last 72 hours. Hemoglobin A1C No results for input(s): HGBA1C in the last 72 hours. Fasting Lipid Panel No results for input(s): CHOL, HDL, LDLCALC, TRIG, CHOLHDL, LDLDIRECT in the last 72 hours. Thyroid Function Tests No results for input(s): TSH, T4TOTAL, T3FREE, THYROIDAB in the last 72 hours.  Invalid input(s): FREET3  Other results:   Imaging    No results found.   Medications:     Scheduled Medications: . [START ON 09/30/2020] aspirin  81 mg Oral Pre-Cath  . Chlorhexidine Gluconate Cloth  6 each Topical Daily  . feeding supplement  237 mL Oral BID BM  . folic acid  1 mg Oral Daily  . hydrALAZINE  50 mg Oral Q8H  . influenza vac split quadrivalent PF  0.5 mL Intramuscular Tomorrow-1000  . insulin aspart  2-6 Units Subcutaneous Q4H  . ipratropium-albuterol  3 mL  Nebulization BID  . isosorbide mononitrate  30 mg Oral Daily  . mouth rinse  15 mL Mouth Rinse BID  . multivitamin with minerals  1 tablet Oral Daily  . pantoprazole  40 mg Oral Daily  . pneumococcal 23 valent vaccine  0.5 mL Intramuscular Tomorrow-1000  . polyethylene glycol  17 g Oral BID  . QUEtiapine  50 mg Oral QHS  . sacubitril-valsartan  1 tablet Oral BID  . senna-docusate  1 tablet Oral BID  . sodium chloride  3 mL Nebulization BID  . sodium chloride flush  10-40 mL Intracatheter Q12H  . sodium chloride flush  10-40 mL Intracatheter Q12H  . sodium chloride flush  3 mL Intravenous Q12H  . sodium chloride flush  3 mL Intravenous Q12H  . spironolactone  25 mg Oral Daily  . thiamine  100 mg Oral Daily    Infusions: . sodium chloride Stopped (09/22/20 2203)  . sodium chloride Stopped (09/27/20 0023)  . sodium chloride    . [START ON 09/30/2020] sodium chloride    . amiodarone 30 mg/hr (09/29/20 0657)  . heparin 1,800 Units/hr (09/29/20 0954)    PRN Medications: sodium chloride, sodium chloride, acetaminophen, hydrALAZINE, ondansetron (ZOFRAN) IV, sodium chloride flush, sodium chloride flush, sodium chloride flush, sodium chloride flush   Assessment/Plan   1. A/C Systolic HF -->Cardiogenic Shock + Septic Shock  - ECHO EF 20% with moderate RV failure. - Off norepi and milrinone. Co-ox 68%. - Volume status ok CVP 8 - Continue spiro 25 mg daily  - Continue hydralazine 50 mg tid and imdur 30 mg daily.  - Increase Entresto to 97/103 bid with HTN - Add low-dose carvedilol - R/L Cath in am   2. Aspiration PNA with septic shock - sputum cx + MSSA - CT chest 10/9 with dense RML/RLL effusion. No effusion - Bronch 10/11 with removal of secretions and upsize ETT - On ancef u- last day today  3. Acute Respiratory Failure - Intubated 10/5. CCM following. - treat aspiration PNA as above - Self extubated 09/24/20.  - stable on [place]  4. PAF - Has history of A flutter back in  2019. Off xarelto due cost for 1 year.  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 On amio gtt  30/hr  - Change to 200 bid to day - Continue heparin up to cath. Switch to Eliquis after cath tomorrow  5. SVT - S/p emergent cardioversion 10/5 - no change  5. AKI - Developed worsening renal function  in the setting of shock and ATN - Creatinine improved with inotropic support.  - Creatinine now back down to 1.2.   6.  ETOH  - aware of need for cessation  7.  Tobacco Abuse  - cessation advised   8. Hypokalemia - K 4.0 - follow BMET  9. Hyponatremia  - Na 136  10. HTN - BP climbing. Adjust meds as above - hydralazine 10IV PRN for SBP > 170  Length of Stay: 12    , MD  09/29/2020, 2:05 PM  Advanced Heart Failure Team Pager [phone] (M-F; 7a - 4p)  Please contact [facility] for night-coverage after hours (4p -7a ) and weekends on [email or web address]      

## 2020-09-29 NOTE — Progress Notes (Signed)
ANTICOAGULATION CONSULT NOTE  Pharmacy Consult for heparin dosing. Indication: atrial fibrillation  No Known Allergies  Patient Measurements: Height: 6' 2.76" (189.9 cm) Weight: 120.8 kg (266 lb 5.1 oz) IBW/kg (Calculated) : 83.96 Heparin Dosing Weight: 104.3 kg   Vital Signs: Temp: 98.3 F (36.8 C) (10/17 1200) Temp Source: Oral (10/17 1200) BP: 155/127 (10/17 1200) Pulse Rate: 87 (10/17 1200)  Labs: Recent Labs    09/27/20 0234 09/27/20 0234 09/27/20 1522 09/27/20 1522 09/28/20 0348 09/28/20 0930 09/29/20 0046  HGB 10.8*   < > 9.7*   < >  --  12.2* 13.5  HCT 34.7*   < > 31.4*  --   --  37.9* 40.9  PLT 401*   < > 342  --   --  406* 411*  HEPARINUNFRC 0.41  --   --   --  0.63  --  0.46  CREATININE 1.36*  --   --   --   --  1.21 1.15   < > = values in this interval not displayed.    Estimated Creatinine Clearance: 120.4 mL/min (by C-G formula based on SCr of 1.15 mg/dL).   Assessment: 39 y.o. male admitted with SVT and respiratory distress. PMH significant for HTN, systolic HF, and aflutter. Not on AC PTA (previously on Xarelto >1 year ago). Patient has now developed afib with rates of 120-150 on amiodarone. Pharmacy has been consulted to start IV heparin in the setting of afib.  Heparin level therapeutic at 0.46 on drip rate 1800 units/hr. Hgb stable 12-13, plts stable . No overt bleeding noted. Plans noted for [employer] on Monday then switch to oral AC  Goal of Therapy:  Heparin level 0.3-0.7 units/ml Monitor platelets by anticoagulation protocol: Yes   Plan:  Continue IV heparin at 1800 units/hr Check daily HL and CBC Monitor for s/sx of bleeding F/u transition to PO AC post-cath   Leota Sauers Pharm.D. CPP, BCPS Clinical Pharmacist [phone] 09/29/2020 2:28 PM    Please check [email or web address] for unit-specific pharmacy phone numbers.

## 2020-09-29 NOTE — Progress Notes (Signed)
  Advanced Heart Failure Rounding Note  PCP-Cardiologist: No primary care provider on file.   Subjective:    - 10/5 Admitted with A/C Sysolic HF-->Decompensated d/t SVT.  Underwent Emergent DC-CV and intubation. Placed on Norepi + milrinone+ amio drip.  - 10/7 developed component of septic shock, CXR w/ RLL infiltrate (likely aspiration). PTC 1.98. Vanc + Zosyn added. VP + NE.  - 10/9 sputum growing MSSA on Ancef - 10/9 CXR with large R effusion.Chest CT with consolidation. No effusion  - 10/11 Bronch with removal of secretions and upsize ETT -10/12 Self extubated  Milrinone stopped 10/14 Co-ox 68%. CVP 8-9. SBP 150-170  Doing well. Denies SOB, orthopnea or PND. Walking halls. Maintaining NSR.   Objective:   Weight Range: 120.8 kg Body mass index is 33.5 kg/m.   Vital Signs:   Temp:  [97.8 F (36.6 C)-98.5 F (36.9 C)] 98.3 F (36.8 C) (10/17 1200) Pulse Rate:  [87-95] 87 (10/17 1200) Resp:  [20-25] 20 (10/17 1200) BP: (137-176)/(90-127) 155/127 (10/17 1200) SpO2:  [98 %-100 %] 100 % (10/17 1200) Weight:  [120.8 kg] 120.8 kg (10/17 0414) Last BM Date: 09/28/20  Weight change: Filed Weights   09/27/20 0500 09/28/20 0428 09/29/20 0414  Weight: 122.2 kg 121.7 kg 120.8 kg    Intake/Output:   Intake/Output Summary (Last 24 hours) at 09/29/2020 1405 Last data filed at 09/29/2020 1200 Gross per 24 hour  Intake 680 ml  Output 1450 ml  Net -770 ml      Physical Exam   General:  Well appearing. No resp difficulty HEENT: normal Neck: supple. JVP 8  Carotids 2+ bilat; no bruits. No lymphadenopathy or thryomegaly appreciated. Cor: PMI nondisplaced. Regular rate & rhythm. No rubs, gallops or murmurs. Lungs: clear x crackles in RLL Abdomen: obese  soft, nontender, nondistended. No hepatosplenomegaly. No bruits or masses. Good bowel sounds. Extremities: no cyanosis, clubbing, rash, edema Neuro: alert & orientedx3, cranial nerves grossly intact. moves all 4 extremities  w/o difficulty. Affect pleasant    Telemetry   NSR 80s Personally reviewed     Labs    CBC Recent Labs    09/28/20 0930 09/29/20 0046  WBC 5.3 4.4  HGB 12.2* 13.5  HCT 37.9* 40.9  MCV 99.2 97.4  PLT 406* 411*   Basic Metabolic Panel Recent Labs    09/27/20 0234 09/28/20 0348 09/28/20 0930 09/29/20 0046  NA   < >  --  136 136  K   < >  --  4.0 4.0  CL   < >  --  101 105  CO2   < >  --  25 22  GLUCOSE   < >  --  171* 93  BUN   < >  --  25* 20  CREATININE   < >  --  1.21 1.15  CALCIUM   < >  --  8.5* 8.8*  MG  --  2.2  --  2.2  PHOS  --  3.9  --  3.5   < > = values in this interval not displayed.   Liver Function Tests No results for input(s): AST, ALT, ALKPHOS, BILITOT, PROT, ALBUMIN in the last 72 hours. No results for input(s): LIPASE, AMYLASE in the last 72 hours. Cardiac Enzymes No results for input(s): CKTOTAL, CKMB, CKMBINDEX, TROPONINI in the last 72 hours.  BNP: BNP (last 3 results) Recent Labs    09/17/20 0836  BNP 1,001.0*    ProBNP (last 3 results) No results for   input(s): PROBNP in the last 8760 hours.   D-Dimer No results for input(s): DDIMER in the last 72 hours. Hemoglobin A1C No results for input(s): HGBA1C in the last 72 hours. Fasting Lipid Panel No results for input(s): CHOL, HDL, LDLCALC, TRIG, CHOLHDL, LDLDIRECT in the last 72 hours. Thyroid Function Tests No results for input(s): TSH, T4TOTAL, T3FREE, THYROIDAB in the last 72 hours.  Invalid input(s): FREET3  Other results:   Imaging    No results found.   Medications:     Scheduled Medications: . [START ON 09/30/2020] aspirin  81 mg Oral Pre-Cath  . Chlorhexidine Gluconate Cloth  6 each Topical Daily  . feeding supplement  237 mL Oral BID BM  . folic acid  1 mg Oral Daily  . hydrALAZINE  50 mg Oral Q8H  . influenza vac split quadrivalent PF  0.5 mL Intramuscular Tomorrow-1000  . insulin aspart  2-6 Units Subcutaneous Q4H  . ipratropium-albuterol  3 mL  Nebulization BID  . isosorbide mononitrate  30 mg Oral Daily  . mouth rinse  15 mL Mouth Rinse BID  . multivitamin with minerals  1 tablet Oral Daily  . pantoprazole  40 mg Oral Daily  . pneumococcal 23 valent vaccine  0.5 mL Intramuscular Tomorrow-1000  . polyethylene glycol  17 g Oral BID  . QUEtiapine  50 mg Oral QHS  . sacubitril-valsartan  1 tablet Oral BID  . senna-docusate  1 tablet Oral BID  . sodium chloride  3 mL Nebulization BID  . sodium chloride flush  10-40 mL Intracatheter Q12H  . sodium chloride flush  10-40 mL Intracatheter Q12H  . sodium chloride flush  3 mL Intravenous Q12H  . sodium chloride flush  3 mL Intravenous Q12H  . spironolactone  25 mg Oral Daily  . thiamine  100 mg Oral Daily    Infusions: . sodium chloride Stopped (09/22/20 2203)  . sodium chloride Stopped (09/27/20 0023)  . sodium chloride    . [START ON 09/30/2020] sodium chloride    . amiodarone 30 mg/hr (09/29/20 0657)  . heparin 1,800 Units/hr (09/29/20 0954)    PRN Medications: sodium chloride, sodium chloride, acetaminophen, hydrALAZINE, ondansetron (ZOFRAN) IV, sodium chloride flush, sodium chloride flush, sodium chloride flush, sodium chloride flush   Assessment/Plan   1. A/C Systolic HF -->Cardiogenic Shock + Septic Shock  - ECHO EF 20% with moderate RV failure. - Off norepi and milrinone. Co-ox 68%. - Volume status ok CVP 8 - Continue spiro 25 mg daily  - Continue hydralazine 50 mg tid and imdur 30 mg daily.  - Increase Entresto to 97/103 bid with HTN - Add low-dose carvedilol - R/L Cath in am   2. Aspiration PNA with septic shock - sputum cx + MSSA - CT chest 10/9 with dense RML/RLL effusion. No effusion - Bronch 10/11 with removal of secretions and upsize ETT - On ancef u- last day today  3. Acute Respiratory Failure - Intubated 10/5. CCM following. - treat aspiration PNA as above - Self extubated 09/24/20.  - stable on [place]  4. PAF - Has history of A flutter back in  2019. Off xarelto due cost for 1 year.  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 On amio gtt  30/hr  - Change to 200 bid to day - Continue heparin up to cath. Switch to Eliquis after cath tomorrow  5. SVT - S/p emergent cardioversion 10/5 - no change  5. AKI - Developed worsening renal function   in the setting of shock and ATN - Creatinine improved with inotropic support.  - Creatinine now back down to 1.2.   6.  ETOH  - aware of need for cessation  7.  Tobacco Abuse  - cessation advised   8. Hypokalemia - K 4.0 - follow BMET  9. Hyponatremia  - Na 136  10. HTN - BP climbing. Adjust meds as above - hydralazine 10IV PRN for SBP > 170  Length of Stay: 12   Nicle Connole, MD  09/29/2020, 2:05 PM  Advanced Heart Failure Team Pager [phone] (M-F; 7a - 4p)  Please contact [facility] for night-coverage after hours (4p -7a ) and weekends on [email or web address]      

## 2020-09-30 ENCOUNTER — Encounter (HOSPITAL_COMMUNITY)
Admission: AD | Disposition: A | Discharge status: Home / Self Care | Payer: Self-pay | Source: Ambulatory Visit | Attending: Internal Medicine

## 2020-09-30 ENCOUNTER — Telehealth (HOSPITAL_COMMUNITY): Payer: Self-pay | Admitting: Pharmacy Technician

## 2020-09-30 ENCOUNTER — Other Ambulatory Visit (HOSPITAL_COMMUNITY): Payer: Self-pay | Admitting: Adult Health

## 2020-09-30 ENCOUNTER — Telehealth (HOSPITAL_COMMUNITY): Payer: Self-pay | Admitting: Licensed Clinical Social Worker

## 2020-09-30 HISTORY — PX: RIGHT/LEFT HEART CATH AND CORONARY ANGIOGRAPHY: CATH118266

## 2020-09-30 LAB — POCT I-STAT EG7
Acid-base deficit: 4 mmol/L — ABNORMAL HIGH (ref 0.0–2.0)
Acid-base deficit: 6 mmol/L — ABNORMAL HIGH (ref 0.0–2.0)
Bicarbonate: 20.8 mmol/L (ref 20.0–28.0)
Bicarbonate: 17.9 mmol/L — ABNORMAL LOW (ref 20.0–28.0)
Calcium, Ion: 1.15 mmol/L (ref 1.15–1.40)
Calcium, Ion: 0.93 mmol/L — ABNORMAL LOW (ref 1.15–1.40)
HCT: 39 % (ref 39.0–52.0)
HCT: 34 % — ABNORMAL LOW (ref 39.0–52.0)
Hemoglobin: 13.3 g/dL (ref 13.0–17.0)
Hemoglobin: 11.6 g/dL — ABNORMAL LOW (ref 13.0–17.0)
O2 Saturation: 76 %
O2 Saturation: 77 %
Potassium: 4.1 mmol/L (ref 3.5–5.1)
Potassium: 3.3 mmol/L — ABNORMAL LOW (ref 3.5–5.1)
Sodium: 141 mmol/L (ref 135–145)
Sodium: 145 mmol/L (ref 135–145)
TCO2: 22 mmol/L (ref 22–32)
TCO2: 19 mmol/L — ABNORMAL LOW (ref 22–32)
pCO2, Ven: 35.2 mmHg — ABNORMAL LOW (ref 44.0–60.0)
pCO2, Ven: 30.5 mmHg — ABNORMAL LOW (ref 44.0–60.0)
pH, Ven: 7.381 (ref 7.250–7.430)
pH, Ven: 7.376 (ref 7.250–7.430)
pO2, Ven: 41 mmHg (ref 32.0–45.0)
pO2, Ven: 42 mmHg (ref 32.0–45.0)

## 2020-09-30 LAB — HEPARIN LEVEL (UNFRACTIONATED): Heparin Unfractionated: 0.45 IU/mL (ref 0.30–0.70)

## 2020-09-30 LAB — CBC
HCT: 41.3 % (ref 39.0–52.0)
Hemoglobin: 13.5 g/dL (ref 13.0–17.0)
MCH: 32.7 pg (ref 26.0–34.0)
MCHC: 32.7 g/dL (ref 30.0–36.0)
MCV: 100 fL (ref 80.0–100.0)
Platelets: 409 10*3/uL — ABNORMAL HIGH (ref 150–400)
RBC: 4.13 MIL/uL — ABNORMAL LOW (ref 4.22–5.81)
RDW: 13.1 % (ref 11.5–15.5)
WBC: 5.3 10*3/uL (ref 4.0–10.5)
nRBC: 0 % (ref 0.0–0.2)

## 2020-09-30 LAB — POCT I-STAT 7, (LYTES, BLD GAS, ICA,H+H)
Acid-base deficit: 5 mmol/L — ABNORMAL HIGH (ref 0.0–2.0)
Bicarbonate: 18.6 mmol/L — ABNORMAL LOW (ref 20.0–28.0)
Calcium, Ion: 1.03 mmol/L — ABNORMAL LOW (ref 1.15–1.40)
HCT: 37 % — ABNORMAL LOW (ref 39.0–52.0)
Hemoglobin: 12.6 g/dL — ABNORMAL LOW (ref 13.0–17.0)
O2 Saturation: 99 %
Potassium: 3.8 mmol/L (ref 3.5–5.1)
Sodium: 142 mmol/L (ref 135–145)
TCO2: 19 mmol/L — ABNORMAL LOW (ref 22–32)
pCO2 arterial: 30.6 mmHg — ABNORMAL LOW (ref 32.0–48.0)
pH, Arterial: 7.391 (ref 7.350–7.450)
pO2, Arterial: 118 mmHg — ABNORMAL HIGH (ref 83.0–108.0)

## 2020-09-30 LAB — BASIC METABOLIC PANEL
Anion gap: 10 (ref 5–15)
BUN: 19 mg/dL (ref 6–20)
CO2: 18 mmol/L — ABNORMAL LOW (ref 22–32)
Calcium: 8.9 mg/dL (ref 8.9–10.3)
Chloride: 108 mmol/L (ref 98–111)
Creatinine, Ser: 1.17 mg/dL (ref 0.61–1.24)
GFR, Estimated: >60 mL/min (ref >60)
Glucose, Bld: 96 mg/dL (ref 70–99)
Potassium: 4.2 mmol/L (ref 3.5–5.1)
Sodium: 136 mmol/L (ref 135–145)

## 2020-09-30 LAB — COOXEMETRY PANEL
Carboxyhemoglobin: 1.1 % (ref 0.5–1.5)
Methemoglobin: 0.9 % (ref 0.0–1.5)
O2 Saturation: 96.9 %
Total hemoglobin: 14.2 g/dL (ref 12.0–16.0)

## 2020-09-30 LAB — PHOSPHORUS: Phosphorus: 4.2 mg/dL (ref 2.5–4.6)

## 2020-09-30 LAB — GLUCOSE, CAPILLARY
Glucose-Capillary: 100 mg/dL — ABNORMAL HIGH (ref 70–99)
Glucose-Capillary: 105 mg/dL — ABNORMAL HIGH (ref 70–99)
Glucose-Capillary: 106 mg/dL — ABNORMAL HIGH (ref 70–99)

## 2020-09-30 LAB — MAGNESIUM: Magnesium: 2.4 mg/dL (ref 1.7–2.4)

## 2020-09-30 SURGERY — RIGHT/LEFT HEART CATH AND CORONARY ANGIOGRAPHY
Anesthesia: LOCAL

## 2020-09-30 MED ORDER — SODIUM CHLORIDE 0.9% FLUSH
3.0000 mL | Freq: Two times a day (BID) | INTRAVENOUS | Status: DC
  Administered 2020-09-30: 3 mL via INTRAVENOUS

## 2020-09-30 MED ORDER — HEPARIN SODIUM (PORCINE) 1000 UNIT/ML IJ SOLN
INTRAMUSCULAR | Status: DC | PRN
  Administered 2020-09-30: 6000 [IU] via INTRAVENOUS

## 2020-09-30 MED ORDER — ACETAMINOPHEN 325 MG PO TABS: 650.0000 mg | ORAL_TABLET | ORAL | Status: DC | PRN

## 2020-09-30 MED ORDER — LIDOCAINE HCL (PF) 1 % IJ SOLN
INTRAMUSCULAR | Status: AC
  Filled 2020-09-30: qty 30

## 2020-09-30 MED ORDER — ONDANSETRON HCL 4 MG/2ML IJ SOLN: 4.0000 mg | Freq: Four times a day (QID) | INTRAMUSCULAR | Status: DC | PRN

## 2020-09-30 MED ORDER — VERAPAMIL HCL 2.5 MG/ML IV SOLN
INTRAVENOUS | Status: AC
  Filled 2020-09-30: qty 2

## 2020-09-30 MED ORDER — SPIRONOLACTONE 25 MG PO TABS: 25.0000 mg | ORAL_TABLET | Freq: Every day | ORAL | 6 refills | Status: DC

## 2020-09-30 MED ORDER — HYDRALAZINE HCL 20 MG/ML IJ SOLN: 10.0000 mg | INTRAMUSCULAR | Status: AC | PRN

## 2020-09-30 MED ORDER — AMIODARONE HCL 200 MG PO TABS: 200.0000 mg | ORAL_TABLET | Freq: Two times a day (BID) | ORAL | 6 refills | Status: DC

## 2020-09-30 MED ORDER — HEPARIN (PORCINE) IN NACL 1000-0.9 UT/500ML-% IV SOLN
INTRAVENOUS | Status: AC
  Filled 2020-09-30: qty 1000

## 2020-09-30 MED ORDER — VERAPAMIL HCL 2.5 MG/ML IV SOLN
INTRAVENOUS | Status: DC | PRN
  Administered 2020-09-30: 10 mL via INTRA_ARTERIAL

## 2020-09-30 MED ORDER — APIXABAN 5 MG PO TABS: 5.0000 mg | ORAL_TABLET | Freq: Two times a day (BID) | ORAL | Status: DC

## 2020-09-30 MED ORDER — FENTANYL CITRATE (PF) 100 MCG/2ML IJ SOLN
INTRAMUSCULAR | Status: DC | PRN
Start: 2020-09-30 — End: 2020-09-30
  Administered 2020-09-30: 25 ug via INTRAVENOUS

## 2020-09-30 MED ORDER — SODIUM CHLORIDE 0.9 % IV SOLN: INTRAVENOUS | Status: AC

## 2020-09-30 MED ORDER — MIDAZOLAM HCL 2 MG/2ML IJ SOLN
INTRAMUSCULAR | Status: AC
  Filled 2020-09-30: qty 2

## 2020-09-30 MED ORDER — HYDRALAZINE HCL 50 MG PO TABS: 50.0000 mg | ORAL_TABLET | Freq: Three times a day (TID) | ORAL | 6 refills | Status: DC

## 2020-09-30 MED ORDER — APIXABAN 5 MG PO TABS: 5.0000 mg | ORAL_TABLET | Freq: Two times a day (BID) | ORAL | 6 refills | Status: DC

## 2020-09-30 MED ORDER — CARVEDILOL 3.125 MG PO TABS: 3.1250 mg | ORAL_TABLET | Freq: Two times a day (BID) | ORAL | 6 refills | Status: DC

## 2020-09-30 MED ORDER — SODIUM CHLORIDE 0.9% FLUSH: 3.0000 mL | INTRAVENOUS | Status: DC | PRN

## 2020-09-30 MED ORDER — HEPARIN (PORCINE) IN NACL 1000-0.9 UT/500ML-% IV SOLN
INTRAVENOUS | Status: DC | PRN
  Administered 2020-09-30 (×2): 500 mL

## 2020-09-30 MED ORDER — ISOSORBIDE MONONITRATE ER 30 MG PO TB24: 30.0000 mg | ORAL_TABLET | Freq: Every day | ORAL | 6 refills | Status: DC

## 2020-09-30 MED ORDER — FENTANYL CITRATE (PF) 100 MCG/2ML IJ SOLN
INTRAMUSCULAR | Status: AC
  Filled 2020-09-30: qty 2

## 2020-09-30 MED ORDER — LIDOCAINE HCL (PF) 1 % IJ SOLN
INTRAMUSCULAR | Status: DC | PRN
  Administered 2020-09-30 (×2): 2 mL

## 2020-09-30 MED ORDER — HEPARIN SODIUM (PORCINE) 1000 UNIT/ML IJ SOLN
INTRAMUSCULAR | Status: AC
  Filled 2020-09-30: qty 1

## 2020-09-30 MED ORDER — MIDAZOLAM HCL 2 MG/2ML IJ SOLN
INTRAMUSCULAR | Status: DC | PRN
  Administered 2020-09-30: 1 mg via INTRAVENOUS

## 2020-09-30 MED ORDER — SODIUM CHLORIDE 0.9 % IV SOLN: 250.0000 mL | INTRAVENOUS | Status: DC | PRN

## 2020-09-30 MED ORDER — IOHEXOL 350 MG/ML SOLN
INTRAVENOUS | Status: DC | PRN
  Administered 2020-09-30: 60 mL

## 2020-09-30 MED ORDER — LABETALOL HCL 5 MG/ML IV SOLN: 10.0000 mg | INTRAVENOUS | Status: AC | PRN

## 2020-09-30 MED ORDER — INFLUENZA VAC SPLIT QUAD 0.5 ML IM SUSY
0.5000 mL | PREFILLED_SYRINGE | Freq: Once | INTRAMUSCULAR | Status: AC
  Administered 2020-09-30: 0.5 mL via INTRAMUSCULAR

## 2020-09-30 MED ORDER — SACUBITRIL-VALSARTAN 97-103 MG PO TABS: 1.0000 | ORAL_TABLET | Freq: Two times a day (BID) | ORAL | 6 refills | Status: DC

## 2020-09-30 MED FILL — hydrALAZINE HCL 50 MG TABS: 50 | 30 days supply | Qty: 90 | Fill #0

## 2020-09-30 MED FILL — ELIQUIS 5 MG TABLET: 5 | 30 days supply | Qty: 60 | Fill #0

## 2020-09-30 MED FILL — SPIRONOLACTONE 25 MG TABLET: 25 | 30 days supply | Qty: 30 | Fill #0

## 2020-09-30 MED FILL — CARVEDILOL 3.125 MG TABLET: 3.125 | 30 days supply | Qty: 60 | Fill #0

## 2020-09-30 MED FILL — AMIODARONE HCL 200 MG TAB: 200 | 30 days supply | Qty: 60 | Fill #0

## 2020-09-30 MED FILL — ISOSORBIDE MN ER 30 MG TAB: 30 | 30 days supply | Qty: 30 | Fill #0

## 2020-09-30 SURGICAL SUPPLY — 10 items
CATH 5FR JL3.5 JR4 ANG PIG MP (CATHETERS) ×1 IMPLANT
CATH SWAN GANZ 7F STRAIGHT (CATHETERS) ×1 IMPLANT
DEVICE RAD COMP TR BAND LRG (VASCULAR PRODUCTS) ×1 IMPLANT
GLIDESHEATH SLEND SS 6F .021 (SHEATH) ×1 IMPLANT
GLIDESHEATH SLENDER 7FR .021G (SHEATH) ×1 IMPLANT
GUIDEWIRE INQWIRE 1.5J.035X260 (WIRE) IMPLANT
INQWIRE 1.5J .035X260CM (WIRE) ×4
KIT HEART LEFT (KITS) ×1 IMPLANT
PACK CARDIAC CATHETERIZATION (CUSTOM PROCEDURE TRAY) ×2 IMPLANT
TRANSDUCER W/STOPCOCK (MISCELLANEOUS) ×2 IMPLANT

## 2020-09-30 NOTE — Discharge Summary (Addendum)
Advanced Heart Failure Team  Discharge Summary   Patient ID: Antero Derosia MRN: [id number], DOB/AGE: 06/08/1981 39 y.o. Admit date: 09/17/2020 D/C date:     09/30/2020   Primary Discharge Diagnoses:  1. A/C Systolic HF -->Cardiogenic Shock + Septic Shock  2. Aspiration PNA with septic shock 3. Acute Respiratory Failure 4. PAF 5. SVT 6. AKI 7.  ETOH  8.  Tobacco Abuse  9. Hypokalemia 10. Hyponatremia  11. HTN     Hospital Course:  Mr Allman is a 39 year old with history of ETOH abuse, A flutter, tobacco abuse, and chronic systolic heart failure.   Admitted from the HF clinic with A/C decompensated heart failure and marked volume overload. On the day of admit he continued to decompensate with SVT.  Underwent Emergent DC-CV and intubation. Placed on Norepi + milrinone+ amio drip. CCM consulted for acute respiratory failure.  Hospital course complicated by septic shock and AKI.  Gradually improved and RHC/LHC on 09/30/20 with normal coronaries, EF 40-45% and well compensated filling pressures.   He will continue to be followed closely in the HF clinic. All meds provided through HF fund. Plan to check EKG, CBC, BMET at his follow up. See below for detailed problem list.     1. A/C Systolic HF -->Cardiogenic Shock + Septic Shock  - ECHO EF 20% with moderate RV failure. - Initially on pressors but gradually weaned off.  - Diuresed with IV lasix and later stopped. Overall diuresed 19 pounds.  - GDMT initiated.  - Continue spiro 25 mg daily  - Continue hydralazine 50 mg tid and imdur 30 mg daily.  - Continue Entresto to 97/103 bid - Add SGT2i as an outpatient  - Had RHC/LHC on the day of d/c. This showed normal coronaries, EF 40-45%, and compensated filling pressures.    2. Aspiration PNA with septic shock - sputum cx + MSSA - CT chest 10/9 with dense RML/RLL effusion. No effusion - Bronch 10/11 with removal of secretions and upsize ETT - Treated with IV antibiotics. Completed  antibiotic course.    3. Acute Respiratory Failure - Intubated 10/5. CCM following. - treat aspiration PNA as above - Self extubated 09/24/20 and eventually weaned off oxygen.    4. PAF - Has history of A flutter back in 2019. Off xarelto due cost for 1 year.  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  -Placed on amio drip and converted to NSR. Continue amio 200 mg twice a day. - On heparin prior to cath but will be on eliquis starting this evening (09/30/20)     5. SVT - S/p emergent cardioversion 10/5 - no change   5. AKI - Developed worsening renal function in the setting of shock and ATN. - Creatinine peaked at 3.4 Creatinine improved with inotropic support.  - Creatinine now back down to 1.17   6.  ETOH  - aware of need for cessation   7.  Tobacco Abuse  - cessation advised    8. Hypokalemia -Resolved.    9. Hyponatremia  - Na 136   10. HTN - Improved with addition of GDMT.    Discharge Vitals: Blood pressure (!) 170/110, pulse 90, temperature 98.3 F (36.8 C), temperature source Oral, resp. rate (!) 26, height 6' 2.76" (1.899 m), weight 119 kg, SpO2 99 %.  Labs: Lab Results  Component Value Date   WBC 5.3 09/30/2020   HGB 13.5 09/30/2020   HCT 41.3 09/30/2020   MCV 100.0 09/30/2020  PLT 409 (H) 09/30/2020    Recent Labs  Lab 09/24/20 0844 09/25/20 0347 09/30/20 0500  NA  --    < > 136  K  --    < > 4.2  CL  --    < > 108  CO2  --    < > 18*  BUN  --    < > 19  CREATININE  --    < > 1.17  CALCIUM  --    < > 8.9  PROT 7.7  --   --   BILITOT 0.5  --   --   ALKPHOS 51  --   --   ALT 21  --   --   AST 45*  --   --   GLUCOSE  --    < > 96   < > = values in this interval not displayed.   Lab Results  Component Value Date   TRIG 167 (H) 09/25/2020   BNP (last 3 results) Recent Labs    09/17/20 0836  BNP 1,001.0*    ProBNP (last 3 results) No results for input(s): PROBNP in the last 8760 hours.   Diagnostic Studies/Procedures    CARDIAC CATHETERIZATION  Result Date: 09/30/2020 Findings: Ao = 123/86 (104) LV = 126/8 RA =  2 RV = 33/8 PA = 32/5 (21) PCW = 4 Fick cardiac output/index = 8.2/3.3 PVR = 2.6 WU FA sat = 99% PA sat = 76%, 77% Assessment: 1. 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 Well-compensated filling pressures Plan/Discussion: Home today. Arvilla Meres, MD 8:42 AM   ECHO 09/17/20   1. Left ventricular ejection fraction, by estimation, is 40 to 45%. The  left ventricle has mildly decreased function. The left ventricle  demonstrates global hypokinesis. There is mild concentric left ventricular  hypertrophy. Left ventricular diastolic  parameters are consistent with Grade II diastolic dysfunction  (pseudonormalization).   2. Right ventricular systolic function is normal. The right ventricular  size is normal.   3. Left atrial size was moderately dilated.   4. Right atrial size was moderately dilated.   5. The mitral valve is normal in structure. Mild mitral valve  regurgitation. No evidence of mitral stenosis.   6. The aortic valve is tricuspid. Aortic valve regurgitation is not  visualized. No aortic stenosis is present.   7. The inferior vena cava is dilated in size with <50% respiratory  variability, suggesting RA pressure 15 mm hg   Discharge Medications   Allergies as of 09/30/2020   No Known Allergies      Medication List     TAKE these medications    amiodarone 200 MG tablet Commonly known as: PACERONE Take 1 tablet (200 mg total) by mouth 2 (two) times daily.   apixaban 5 MG Tabs tablet Commonly known as: ELIQUIS Take 1 tablet (5 mg total) by mouth 2 (two) times daily.   carvedilol 3.125 MG tablet Commonly known as: COREG Take 1 tablet (3.125 mg total) by mouth 2 (two) times daily with a meal.   hydrALAZINE 50 MG tablet Commonly known as: APRESOLINE Take 1 tablet (50 mg total) by mouth every 8 (eight) hours.   isosorbide mononitrate 30 MG 24 hr tablet Commonly  known as: IMDUR Take 1 tablet (30 mg total) by mouth daily.   sacubitril-valsartan 97-103 MG Commonly known as: ENTRESTO Take 1 tablet by mouth 2 (two) times daily.   spironolactone 25 MG tablet Commonly known as: ALDACTONE Take 1 tablet (  25 mg total) by mouth daily.        Disposition   The patient will be discharged in stable condition to home. Discharge Instructions     Diet - low sodium heart healthy   Complete by: As directed    Heart Failure patients record your daily weight using the same scale at the same time of day   Complete by: As directed    Increase activity slowly   Complete by: As directed        Follow-up Information     [facility] Follow up.   Contact information: [address] [place] [address] [phone]        [facility] Follow up on 10/09/2020.   Specialty: Cardiology Why: at 1:30 Garage Code [id number]  Contact information: [address] [id number] [place] [address] [phone]                  Duration of Discharge Encounter: Greater than 35 minutes   Signed, Amy Clegg NP-C  09/30/2020, 10:08 AM  Agree with above. He is ready for d/c. Please see rounding note for further details.   Arvilla Meres, MD  7:39 PM

## 2020-09-30 NOTE — Telephone Encounter (Signed)
CSW assisted pt in completing [employer] application in hospital then provided to MD for review and signature on physician portion.   Completed application faxed to [employer] for review- fax confirmation received  Burna Sis, LCSW Clinical Social Worker Advanced Heart Failure Clinic Desk#: [phone] Cell#: [phone]

## 2020-09-30 NOTE — Progress Notes (Addendum)
Advanced Heart Failure Rounding Note  PCP-Cardiologist: No primary care provider on file.   Subjective:    - 10/5 Admitted with A/C Sysolic HF-->Decompensated d/t SVT.  Underwent Emergent DC-CV and intubation. Placed on Norepi + milrinone+ amio drip.  - 10/7 developed component of septic shock, CXR w/ RLL infiltrate (likely aspiration). PTC 1.98. Vanc + Zosyn added. VP + NE.  - 10/9 sputum growing MSSA on Ancef - 10/9 CXR with large R effusion.Chest CT with consolidation. No effusion  - 10/11 Bronch with removal of secretions and upsize ETT -10/12 Self extubated  Milrinone stopped 10/14  Denies SOB. Denies chest pain.   Objective:   Weight Range: 119 kg Body mass index is 33 kg/m.   Vital Signs:   Temp:  [97.9 F (36.6 C)-98.3 F (36.8 C)] 98.1 F (36.7 C) (10/18 0700) Pulse Rate:  [87-88] 87 (10/18 0700) Resp:  [18-20] 18 (10/18 0700) BP: (136-176)/(74-127) 139/90 (10/18 0700) SpO2:  [98 %-100 %] 98 % (10/18 0700) Weight:  [254 kg] 119 kg (10/18 0300) Last BM Date: 09/29/20  Weight change: Filed Weights   09/28/20 0428 09/29/20 0414 09/30/20 0300  Weight: 121.7 kg 120.8 kg 119 kg    Intake/Output:   Intake/Output Summary (Last 24 hours) at 09/30/2020 0711 Last data filed at 09/30/2020 0600 Gross per 24 hour  Intake 2110.78 ml  Output 2250 ml  Net -139.22 ml      Physical Exam  CVP 4 General:  Well appearing. No resp difficulty HEENT: normal Neck: supple. no JVD. Carotids 2+ bilat; no bruits. No lymphadenopathy or thryomegaly appreciated. Cor: PMI nondisplaced. Regular rate & rhythm. No rubs, gallops or murmurs. Lungs: clear Abdomen: soft, nontender, nondistended. No hepatosplenomegaly. No bruits or masses. Good bowel sounds. Extremities: no cyanosis, clubbing, rash, edema Neuro: alert & orientedx3, cranial nerves grossly intact. moves all 4 extremities w/o difficulty. Affect pleasant   Telemetry  NSR 80s personally reviewed      Labs     CBC Recent Labs    09/29/20 0046 09/30/20 0500  WBC 4.4 5.3  HGB 13.5 13.5  HCT 40.9 41.3  MCV 97.4 100.0  PLT 411* 270*   Basic Metabolic Panel Recent Labs    09/29/20 0046 09/30/20 0500  NA 136 136  K 4.0 4.2  CL 105 108  CO2 22 18*  GLUCOSE 93 96  BUN 20 19  CREATININE 1.15 1.17  CALCIUM 8.8* 8.9  MG 2.2 2.4  PHOS 3.5 4.2   Liver Function Tests No results for input(s): AST, ALT, ALKPHOS, BILITOT, PROT, ALBUMIN in the last 72 hours. No results for input(s): LIPASE, AMYLASE in the last 72 hours. Cardiac Enzymes No results for input(s): CKTOTAL, CKMB, CKMBINDEX, TROPONINI in the last 72 hours.  BNP: BNP (last 3 results) Recent Labs    09/17/20 0836  BNP 1,001.0*    ProBNP (last 3 results) No results for input(s): PROBNP in the last 8760 hours.   D-Dimer No results for input(s): DDIMER in the last 72 hours. Hemoglobin A1C No results for input(s): HGBA1C in the last 72 hours. Fasting Lipid Panel No results for input(s): CHOL, HDL, LDLCALC, TRIG, CHOLHDL, LDLDIRECT in the last 72 hours. Thyroid Function Tests No results for input(s): TSH, T4TOTAL, T3FREE, THYROIDAB in the last 72 hours.  Invalid input(s): FREET3  Other results:   Imaging    No results found.   Medications:     Scheduled Medications: . amiodarone  200 mg Oral BID  . aspirin  81 mg Oral Pre-Cath  . carvedilol  3.125 mg Oral BID WC  . Chlorhexidine Gluconate Cloth  6 each Topical Daily  . feeding supplement  237 mL Oral BID BM  . folic acid  1 mg Oral Daily  . hydrALAZINE  50 mg Oral Q8H  . influenza vac split quadrivalent PF  0.5 mL Intramuscular Tomorrow-1000  . insulin aspart  2-6 Units Subcutaneous Q4H  . ipratropium-albuterol  3 mL Nebulization BID  . isosorbide mononitrate  30 mg Oral Daily  . mouth rinse  15 mL Mouth Rinse BID  . multivitamin with minerals  1 tablet Oral Daily  . pantoprazole  40 mg Oral Daily  . pneumococcal 23 valent vaccine  0.5 mL Intramuscular  Tomorrow-1000  . polyethylene glycol  17 g Oral BID  . QUEtiapine  50 mg Oral QHS  . sacubitril-valsartan  1 tablet Oral BID  . senna-docusate  1 tablet Oral BID  . sodium chloride  3 mL Nebulization BID  . spironolactone  25 mg Oral Daily  . thiamine  100 mg Oral Daily    Infusions: . sodium chloride Stopped (09/27/20 0023)  . sodium chloride 10 mL/hr at 09/30/20 0047  . heparin 1,800 Units/hr (09/30/20 0600)    PRN Medications: acetaminophen, hydrALAZINE, ondansetron (ZOFRAN) IV   Assessment/Plan   1. A/C Systolic HF -->Cardiogenic Shock + Septic Shock  - ECHO EF 20% with moderate RV failure. - Off norepi and milrinone.  - Volume status stable. CVp 4.  - Continue spiro 25 mg daily  - Continue hydralazine 50 mg tid and imdur 30 mg daily.  - Continue Entresto to 97/103 bid- ConAdd low-dose carvedilol - R/L Cath today.   2. Aspiration PNA with septic shock - sputum cx + MSSA - CT chest 10/9 with dense RML/RLL effusion. No effusion - Bronch 10/11 with removal of secretions and upsize ETT - Completed antibiotic course.   3. Acute Respiratory Failure - Intubated 10/5. CCM following. - treat aspiration PNA as above - Self extubated 09/24/20.  - stable on room air  4. PAF - Has history of A flutter back in 2019. Off xarelto due cost for 1 year.  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 Continue amio 200 bid to day - Continue heparin up to cath. Switch to Eliquis after cath   5. SVT - S/p emergent cardioversion 10/5 - no change  5. AKI - Developed worsening renal function in the setting of shock and ATN - Creatinine improved with inotropic support.  - Creatinine now back down to 1.17  6.  ETOH  - aware of need for cessation  7.  Tobacco Abuse  - cessation advised   8. Hypokalemia -Resolved.   9. Hyponatremia  - Na 136  10. HTN - hydralazine 10IV PRN for SBP > 170    Length of Stay: [place], NP  09/30/2020, 7:11  AM  Advanced Heart Failure Team Pager [phone] (M-F; [place])  Please contact [facility] for night-coverage after hours (4p -7a ) and weekends on [email or web address]  Patient seen and examined with the above-signed Advanced Practice Provider and/or Housestaff. I personally reviewed laboratory data, imaging studies and relevant notes. I independently examined the patient and formulated the important aspects of the plan. I have edited the note to reflect any of my changes or salient points. I have personally discussed the plan with the patient and/or family.  Off milrinone. On po amio and heparin.  No bleeding. Remains in NSR. No SOB, orthopnea or PND  General:  Well appearing. No resp difficulty HEENT: normal Neck: supple. no JVD. Carotids 2+ bilat; no bruits. No lymphadenopathy or thryomegaly appreciated. Cor: PMI nondisplaced. Regular rate & rhythm. No rubs, gallops or murmurs. Lungs: clear Abdomen: obese soft, nontender, nondistended. No hepatosplenomegaly. No bruits or masses. Good bowel sounds. Extremities: no cyanosis, clubbing, rash, edema Neuro: alert & orientedx3, cranial nerves grossly intact. moves all 4 extremities w/o difficulty. Affect pleasant  Doing well. For R/L cath today. If everything looks good. Home alter today on eliquis.   Glori Bickers, MD  7:59 AM

## 2020-09-30 NOTE — Progress Notes (Signed)
Physical Therapy Treatment Patient Details Name: Antonio Tapia MRN: [id number] DOB: [birth year] Today's Date: 09/30/2020    History of Present Illness 38 yo admitted 10/5 with acute on chronic heart failure with SVT and urgent DCCV and intubated, pt with aspiration PNA, sepsis. 10/11 bronchoscopy. PMHx: cardiomyopathy, CHF, Aflutter, HTN    PT Comments    Pt s/p cath lab and awaiting d/c, agreeable to walk with therapy. Pt reports that he has been walking independently over the weekend. Pt is currently mod I for bed mobility, and transfers and min guard for ambulation of 400 feet. PT continues to recommend HHPT however pt will likely not receive due to pending Medicare so focus of session education on need for regular movement/exercise as well and other lifestyle changes given his CHF. Session shortened by presence of IV team to remove PICC line.   Follow Up Recommendations  Home health PT     Equipment Recommendations  None recommended by PT       Precautions / Restrictions Precautions Precautions: Fall Restrictions Weight Bearing Restrictions: No    Mobility  Bed Mobility Overal bed mobility: Needs Assistance Bed Mobility: Sit to Supine     Supine to sit: Modified independent (Device/Increase time)     General bed mobility comments: use of bed rail and HoB  to come to EoB  Transfers Overall transfer level: Needs assistance   Transfers: Sit to/from Stand Sit to Stand: Modified independent (Device/Increase time)         General transfer comment: good power up and self steadying   Ambulation/Gait Ambulation/Gait assistance: Min guard Gait Distance (Feet): 400 Feet Assistive device: None Gait Pattern/deviations: Step-through pattern;Decreased stride length Gait velocity: slowed   General Gait Details: min guard for safety with slow, steady gait       Balance Overall balance assessment: Mild deficits observed, not formally tested                                           Cognition Arousal/Alertness: Awake/alert Behavior During Therapy: WFL for tasks assessed/performed Overall Cognitive Status: Within Functional Limits for tasks assessed                                           General Comments General comments (skin integrity, edema, etc.): SaO2 on RA 95-100% O2, max noted HR with ambulation 102 bpm      Pertinent Vitals/Pain Pain Assessment: No/denies pain    Home Living Family/patient expects to be discharged to:: Private residence Living Arrangements: Alone Available Help at Discharge: Friend(s);Available 24 hours/day Type of Home: House Home Access: Stairs to enter   Home Layout: One level Home Equipment: None      Prior Function Level of Independence: Independent      Comments: pt does not work, helps his girlfriend take care of her 78 yo daughter but girlfriend can assist as needed at D/C   PT Goals (current goals can now be found in the care plan section) Acute Rehab PT Goals Patient Stated Goal: return home PT Goal Formulation: With patient Time For Goal Achievement: 10/10/20 Potential to Achieve Goals: Good Progress towards PT goals: Progressing toward goals    Frequency    Min 3X/week      PT Plan Current plan remains appropriate  AM-PAC PT "6 Clicks" Mobility   Outcome Measure  Help needed turning from your back to your side while in a flat bed without using bedrails?: A Little Help needed moving from lying on your back to sitting on the side of a flat bed without using bedrails?: A Little Help needed moving to and from a bed to a chair (including a wheelchair)?: A Little Help needed standing up from a chair using your arms (e.g., wheelchair or bedside chair)?: A Little Help needed to walk in hospital room?: A Little Help needed climbing 3-5 steps with a railing? : A Little 6 Click Score: 18    End of Session Equipment Utilized During Treatment: Gait  belt;Oxygen Activity Tolerance: Patient tolerated treatment well Patient left: in bed;with call bell/phone within reach;with nursing/sitter in room Nurse Communication: Mobility status PT Visit Diagnosis: Other abnormalities of gait and mobility (R26.89);Difficulty in walking, not elsewhere classified (R26.2)     Time: 8315-1761 PT Time Calculation (min) (ACUTE ONLY): 15 min  Charges:  $Gait Training: 8-22 mins                     Antonio Tapia B. Beverely Risen PT, DPT Acute Rehabilitation Services Pager [phone] Office [phone]    [name] 09/30/2020, 11:07 AM

## 2020-09-30 NOTE — Interval H&P Note (Signed)
History and Physical Interval Note:  09/30/2020 7:58 AM  Antonio Tapia  has presented today for surgery, with the diagnosis of Heart Failure.  The various methods of treatment have been discussed with the patient and family. After consideration of risks, benefits and other options for treatment, the patient has consented to  Procedure(s): RIGHT/LEFT HEART CATH AND CORONARY ANGIOGRAPHY (N/A) and possible coronary angioplasty as a surgical intervention.  The patient's history has been reviewed, patient examined, no change in status, stable for surgery.  I have reviewed the patient's chart and labs.  Questions were answered to the patient's satisfaction.     Shomari Scicchitano

## 2020-09-30 NOTE — Telephone Encounter (Signed)
Started an application for the patient's Entresto with [employer]. Patient currently is without insurance.  Will fax application in once signature are obtained.  Will follow up.

## 2020-09-30 NOTE — Progress Notes (Addendum)
Outpatient CSW consulted during last outpatient visit to enroll patient in paramedicine but pt was admitted directly from appt.  CSW informed pt will DC today so met to discuss paramedicine enrollment.  Pt agreeable to having paramedic come out to house and assist with medical management at home.   Paramedicine Initial Assessment:  Housing:  In what kind of housing do you live? House/apt/trailer/shelter? apartment  Do you rent/pay a mortgage/own? rent  Do you live with anyone? girlfriend  Are you currently worried about losing your housing? no  Within the past 12 months have you ever stayed outside, in a car, tent, a shelter, or temporarily with someone? no  Within the past 12 months have you been unable to get utilities when it was really needed? no  Social:  What is your current marital status? single  Do you have family or friends who live locally? Mother and father still live in this area and are a good source of support  Food:  Within the past 12 months were you ever worried that food would run out before you got money to buy more? no  Within the past 44month have you run out of food and didn't have money to buy more? no  Income:  What is your current source of income? Currently unemployed- relies on his girlfriend to support household expenses.  How hard is it for you to pay for the basics like food housing, medical care, and utilities? unknown  Do you have outstanding medical bills? Yes- uninsured and multiple outstanding charges  Insurance:  Are you currently insured? no  Do you have prescription coverage? No- will get help through HF fund and application submitted for [employer] If no insurance, have you applied for coverage (Medicaid, disability, marketplace etc)?  [employer] submitting Medicaid app on pt behalf.  Transportation:  Do you have transportation to your medical appointments? Yes has his own car no concerns with getting to and  from appts.  In the past 12 months has lack of transportation kept you from medical appts or from getting medications? no  In the past 12 months has lack of transportation kept you from meetings, work, or getting things you needed? no   Daily Health Needs: Do you have a working scale at home? No- paramedicine to bring out   How do you manage your medications at home?  Hasn't really been managing his medications at home because he hasn't been taking them but states he does have a pill box  Do you ever take your medications differently than prescribed? Hasn't been taking them due to affordability concerns.  Do you have issues affording your medications? Yes- but will be able to access HF Fund and application submitted for [employer] help with entresto  If yes, has this ever prevented you from obtaining medications? yes  Do you have any concerns with mobility at home? no  Do you use any assistive devices at home or have PCS at home? no  Do you have a PCP? KMolli Barrows  Are there any additional barriers you see to getting the care you need?  Pt had been struggling with alcohol use prior to admission which had contributed to his heart failure.  After very serious heart failure admission he feels confident that he can stay away from alcohol.  Doesn't feel as if he has an addiction so thinks he should be able to stay away.  Pt is unsure the level of support he will receive  from his girlfriend in stopping drinking.  Pt feels as if he doesn't need resources at this time but CSW encouraged pt to call if he gets home and it is harder to stop than he realized and needs support.  CSW will continue to follow through paramedicine program and assist as needed.  Jorge Ny, LCSW Clinical Social Worker Advanced Heart Failure Clinic Desk#: [phone] Cell#: [phone]

## 2020-09-30 NOTE — Progress Notes (Signed)
PT Cancellation Note  Patient Details Name: Antonio Tapia MRN: [id number] DOB: [birth year]   Cancelled Treatment:    Reason Eval/Treat Not Completed: (P) Patient at procedure or test/unavailable Pt in Cath Lab. PT will follow back for treatment this afternoon   Antonio Tapia PT, DPT Acute Rehabilitation Services Pager [phone] Office [phone]  Antonio Tapia 09/30/2020, 7:54 AM

## 2020-09-30 NOTE — Progress Notes (Signed)
PT d/c home. D/c instructions given to pt and family in the room. No further questions. Vitals stable.

## 2020-09-30 NOTE — TOC Transition Note (Signed)
Transition of Care [facility]) - CM/SW Discharge Note   Patient Details  Name: Antonio Tapia MRN: [id number] Date of Birth: 1981/03/29  [facility] CM/SW Contact:  Leone Haven, RN Phone Number: 09/30/2020, 9:28 AM   Clinical Narrative:    Patient is for dc today, NCM assisted him with Match Letter for meds 3.00 each.  Also scheduled a follow up apt for him at the CHW clinic, see AVS.  NCM contacted [place] with HF,she states HF will also be assisting him with the expensive HF medications.  NCM contacted financial counselor, they will come by to see patient today to discuss Medicaid.  Patient states he would like to go to the CHW clinic to get establish there and he gave this NCM permission to speak with his father to update on patient assistance in place.  His father will be transporting him home today at dc.   Final next level of care: Home/Self Care Barriers to Discharge: No Barriers Identified   Patient Goals and CMS Choice Patient states their goals for this hospitalization and ongoing recovery are:: get better   Choice offered to / list presented to : NA  Discharge Placement                       Discharge Plan and Services                  DME Agency: NA       HH Arranged: NA          Social Determinants of Health (SDOH) Interventions     Readmission Risk Interventions No flowsheet data found.

## 2020-10-01 ENCOUNTER — Encounter (HOSPITAL_COMMUNITY): Payer: Self-pay | Admitting: Internal Medicine

## 2020-10-01 NOTE — Telephone Encounter (Signed)
Advanced Heart Failure Patient Advocate Encounter   Patient was approved to receive Entresto from [employer].  Patient ID: [id number] Effective dates: 09/30/20 through 09/30/21  Called and left patient a message regarding the approval.  Archer Asa, CPhT

## 2020-10-03 ENCOUNTER — Telehealth (HOSPITAL_COMMUNITY): Payer: Self-pay

## 2020-10-03 NOTE — Telephone Encounter (Signed)
I called Mr Routon to try and set up an initial visit. He did not answer so I left a voicemail with my information and asked that he return my call at his earliest convenience.   Jacqualine Code, EMT 10/03/20

## 2020-10-04 ENCOUNTER — Telehealth (HOSPITAL_COMMUNITY): Payer: Self-pay

## 2020-10-04 NOTE — Telephone Encounter (Signed)
Mr Advani called me to schedule an initial visit. He stated he has been feeling much better since leaving the hospital and was able to obtain a scale for home use. He stated he would be able to meet with me any day next week except the 27th. We agreed to meet on Thursday the 28th at 15:30.  Jacqualine Code, EMT 10/04/20

## 2020-10-04 NOTE — Telephone Encounter (Signed)
I called Mr Shuck to schedule an initial visit. He did not answer so I left a voicemail with my information and reason for calling and requested that he call me back as soon as he is able.   Jacqualine Code, EMT 10/04/20

## 2020-10-09 ENCOUNTER — Encounter (HOSPITAL_COMMUNITY): Payer: Self-pay

## 2020-10-09 ENCOUNTER — Telehealth (HOSPITAL_COMMUNITY): Payer: Self-pay

## 2020-10-09 ENCOUNTER — Other Ambulatory Visit: Payer: Self-pay

## 2020-10-09 ENCOUNTER — Other Ambulatory Visit (HOSPITAL_COMMUNITY): Payer: Self-pay

## 2020-10-09 ENCOUNTER — Other Ambulatory Visit (HOSPITAL_COMMUNITY): Payer: Self-pay | Admitting: Cardiology

## 2020-10-09 ENCOUNTER — Ambulatory Visit (HOSPITAL_COMMUNITY)
Admit: 2020-10-09 | Discharge: 2020-10-09 | Disposition: A | Discharge status: Home / Self Care | Payer: Self-pay | Source: Ambulatory Visit | Attending: Cardiology | Admitting: Cardiology

## 2020-10-09 VITALS — BP 122/81 | HR 82 | Wt 259.4 lb

## 2020-10-09 DIAGNOSIS — Z8249 Family history of ischemic heart disease and other diseases of the circulatory system: Secondary | ICD-10-CM | POA: Insufficient documentation

## 2020-10-09 DIAGNOSIS — I428 Other cardiomyopathies: Secondary | ICD-10-CM | POA: Insufficient documentation

## 2020-10-09 DIAGNOSIS — Z7901 Long term (current) use of anticoagulants: Secondary | ICD-10-CM | POA: Insufficient documentation

## 2020-10-09 DIAGNOSIS — F17200 Nicotine dependence, unspecified, uncomplicated: Secondary | ICD-10-CM | POA: Insufficient documentation

## 2020-10-09 DIAGNOSIS — I5022 Chronic systolic (congestive) heart failure: Secondary | ICD-10-CM | POA: Insufficient documentation

## 2020-10-09 DIAGNOSIS — I480 Paroxysmal atrial fibrillation: Secondary | ICD-10-CM | POA: Insufficient documentation

## 2020-10-09 DIAGNOSIS — Z79899 Other long term (current) drug therapy: Secondary | ICD-10-CM | POA: Insufficient documentation

## 2020-10-09 DIAGNOSIS — I471 Supraventricular tachycardia: Secondary | ICD-10-CM | POA: Insufficient documentation

## 2020-10-09 DIAGNOSIS — I110 Hypertensive heart disease with heart failure: Secondary | ICD-10-CM | POA: Insufficient documentation

## 2020-10-09 LAB — BASIC METABOLIC PANEL
Anion gap: 10 (ref 5–15)
BUN: 15 mg/dL (ref 6–20)
CO2: 23 mmol/L (ref 22–32)
Calcium: 9.4 mg/dL (ref 8.9–10.3)
Chloride: 103 mmol/L (ref 98–111)
Creatinine, Ser: 1.17 mg/dL (ref 0.61–1.24)
GFR, Estimated: >60 mL/min (ref >60)
Glucose, Bld: 99 mg/dL (ref 70–99)
Potassium: 4.9 mmol/L (ref 3.5–5.1)
Sodium: 136 mmol/L (ref 135–145)

## 2020-10-09 LAB — CBC
HCT: 44 % (ref 39.0–52.0)
Hemoglobin: 14.2 g/dL (ref 13.0–17.0)
MCH: 32.5 pg (ref 26.0–34.0)
MCHC: 32.3 g/dL (ref 30.0–36.0)
MCV: 100.7 fL — ABNORMAL HIGH (ref 80.0–100.0)
Platelets: 361 10*3/uL (ref 150–400)
RBC: 4.37 MIL/uL (ref 4.22–5.81)
RDW: 13 % (ref 11.5–15.5)
WBC: 2.6 10*3/uL — ABNORMAL LOW (ref 4.0–10.5)
nRBC: 0 % (ref 0.0–0.2)

## 2020-10-09 MED ORDER — AMIODARONE HCL 200 MG PO TABS
200.0000 mg | ORAL_TABLET | Freq: Every day | ORAL | 3 refills | Status: DC
Start: 2020-10-09 — End: 2020-10-31

## 2020-10-09 MED ORDER — DAPAGLIFLOZIN PROPANEDIOL 10 MG PO TABS: 10.0000 mg | ORAL_TABLET | Freq: Every day | ORAL | 3 refills | Status: DC

## 2020-10-09 MED FILL — FARXIGA 10 MG TABLET: 10 | 30 days supply | Qty: 30 | Fill #0

## 2020-10-09 NOTE — Progress Notes (Signed)
Antonio Tapia was seen in the clinic today with Robbie Lis, PA-C. He reported feeling generally well and stated he has been compliant with his medications. He also stated he has not been drinking or smoking at all since being discharged from the hospital. While in the hospital, in-patient social work applied for [facility] for him but he has not heard back yet. At present he is not working and is without healthcare coverage. We will work on coverage as open enrollment approaches. Per [place] he has a work restriction of "no heavy lifting" but is otherwise cleared to work. At today's appointment he was started on Farxiga 10 mg and his amiodarone was decreased to 200 mg daily (previously BID). I will follow up tomorrow at his home for our initial home visit.   Jacqualine Code, EMT 10/09/20

## 2020-10-09 NOTE — Progress Notes (Signed)
Advanced Heart Failure Clinic Note   Referring Physician: PCP: [name], FNP PCP-Cardiologist: Dr. Gala Romney   Reason for Visit: Post hospital f/u for systolic heart failure   HPI:  Antonio Tapia is a 39 year old with history of ETOH abuse, A flutter, tobacco abuse, and chronic systolic heart failure.   Admitted from the HF clinic 09/17/20 with A/C decompensated heart failure and marked volume overload. On the day of admit he continued to decompensate with SVT. Underwent Emergent DC-CV and intubation. Placed on Norepi + milrinone+ amio drip. CCM consulted for acute respiratory failure.  Hospital course complicated by septic shock from aspiration PNA and AKI.  Gradually improved and RHC/LHC on 09/30/20 with normal coronaries, EF 40-45% and well compensated filling pressures.D/c wt was 261 lb.   He returns to clinic today for post hospital f/u. Doing well. Wt stable, down 3 lb at  258 lb. Denies dyspnea w/ ADLs. No orthopnea, PND or LEE. Denies palpitations. EKG today shows  NSR. BP well controlled. Reports full med compliance. No intolerances or side effects. He reports to me that he has not smoked nor had ETOH since his hospital d/c.   Cardiac Studies - Echo 2019: EF 30-25%, RV normal - Echo 10/21: EF 20%, RV moderately reduced  - [facility] 10/21 Findings:  Ao = 123/86 (104) LV = 126/8 RA =  2 RV = 33/8 PA = 32/5 (21) PCW = 4 Fick cardiac output/index = 8.2/3.3 PVR = 2.6 WU FA sat = 99% PA sat = 76%, 77%  Assessment: 1. 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 Well-compensated filling pressures   Review of systems complete and found to be negative unless listed in HPI.     Past Medical History:  Diagnosis Date  . Acute congestive heart failure (HCC)   . Alcohol use   . Atrial flutter (HCC)   . Essential hypertension   . Tobacco use     Current Outpatient Medications  Medication Sig Dispense Refill  . amiodarone (PACERONE) 200 MG tablet Take 1 tablet  (200 mg total) by mouth 2 (two) times daily. 45 tablet 6  . apixaban (ELIQUIS) 5 MG TABS tablet Take 1 tablet (5 mg total) by mouth 2 (two) times daily. 60 tablet 6  . carvedilol (COREG) 3.125 MG tablet Take 1 tablet (3.125 mg total) by mouth 2 (two) times daily with a meal. 60 tablet 6  . hydrALAZINE (APRESOLINE) 50 MG tablet Take 1 tablet (50 mg total) by mouth every 8 (eight) hours. 90 tablet 6  . isosorbide mononitrate (IMDUR) 30 MG 24 hr tablet Take 1 tablet (30 mg total) by mouth daily. 30 tablet 6  . sacubitril-valsartan (ENTRESTO) 97-103 MG Take 1 tablet by mouth 2 (two) times daily. 60 tablet 6  . spironolactone (ALDACTONE) 25 MG tablet Take 1 tablet (25 mg total) by mouth daily. 30 tablet 6   No current facility-administered medications for this encounter.    No Known Allergies    Social History   Socioeconomic History  . Marital status: Single    Spouse name: Not on file  . Number of children: Not on file  . Years of education: Not on file  . Highest education level: Not on file  Occupational History  . Not on file  Tobacco Use  . Smoking status: Current Every Day Smoker  . Smokeless tobacco: Never Used  Substance and Sexual Activity  . Alcohol use: Yes    Comment: 1 pint liquor/day  . Drug  use: No  . Sexual activity: Not on file  Other Topics Concern  . Not on file  Social History Narrative  . Not on file   Social Determinants of Health   Financial Resource Strain: Low Risk   . Difficulty of Paying Living Expenses: Not very hard  Food Insecurity: No Food Insecurity  . Worried About Programme researcher, broadcasting/film/video in the Last Year: Never true  . Ran Out of Food in the Last Year: Never true  Transportation Needs: No Transportation Needs  . Lack of Transportation (Medical): No  . Lack of Transportation (Non-Medical): No  Physical Activity:   . Days of Exercise per Week: Not on file  . Minutes of Exercise per Session: Not on file  Stress:   . Feeling of Stress : Not on  file  Social Connections:   . Frequency of Communication with Friends and Family: Not on file  . Frequency of Social Gatherings with Friends and Family: Not on file  . Attends Religious Services: Not on file  . Active Member of Clubs or Organizations: Not on file  . Attends Banker Meetings: Not on file  . Marital Status: Not on file  Intimate Partner Violence:   . Fear of Current or Ex-Partner: Not on file  . Emotionally Abused: Not on file  . Physically Abused: Not on file  . Sexually Abused: Not on file      Family History  Problem Relation Age of Onset  . Hypertension Mother   . Hypertension Father   . Diabetes Paternal Grandmother   . Breast cancer Paternal Grandfather     Vitals:   10/09/20 1327  BP: 122/81  Pulse: 82  SpO2: 98%  Weight: 117.7 kg     PHYSICAL EXAM: General:  Well appearing, young male. No respiratory difficulty HEENT: normal Neck: supple. no JVD. Carotids 2+ bilat; no bruits. No lymphadenopathy or thyromegaly appreciated. Cor: PMI nondisplaced. Regular rate & rhythm. No rubs, gallops or murmurs. Lungs: clear Abdomen: soft, nontender, nondistended. No hepatosplenomegaly. No bruits or masses. Good bowel sounds. Extremities: no cyanosis, clubbing, rash, edema Neuro: alert & oriented x 3, cranial nerves grossly intact. moves all 4 extremities w/o difficulty. Affect pleasant.  ECG: NSR w/ 1st degree AVB 76 bpm    ASSESSMENT & PLAN:  1. 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  - recent admit for a/c decompensated HF 10/21 c/b SVT leading to cardiogenic shock  - Echo 10/21: EF 20%, RV moderately reduced  - R/LHC showed normal coronaries, EF 40-45%, and compensated filling pressures. Fick cardiac output/index = 8.2/3.3. D/c Wt 261 lb - doing well post discharge. NYHA class II. Wt stable, down 3 lb. BP well  controlled. - Continue Entresto 97-103 mg bid - Continue Spiro 25 mg daily  - Continue Coreg 3.125 mg bid - Continue Imdur 30 mg daily  - Continue hydralazine 50 mg tid - Add Farxiga 10 mg daily. Check Hgb A1c  - Check BMP today and again in 7 days - refer to paramedicine to help w/ med compliance   2. SVT - recent admit 10/21 w/ hemodynamically unstable SVT requiring emergent DCCV  - EKG today shows NSR, denies palpitations - continue  blocker - he has quit drinking, which should help reduce risk of recurrence  - if recurrence, would refer to EP for ablation   3. PAF - NSR on  EKG today  - on Eliquis for stroke prophylaxis. Denies abnormal bleeding. Check CBC   - will ultimately need sleep study once he has insurance (h/o snoring and HTN)  4. HTN: controlled on current regimen, 122/81 today  - continue regimen per above  - check BMP   5. ETOH Abuse - he has not had alcohol since his discharge - congratulated on efforts   6. Tobacco Abuse  - he has quit smoking - congratulated on efforts   F/u w/ PharmD in 4 weeks, APP in 8 weeks and Dr. Gala Romney in 12 weeks    Robbie Lis, PA-C 10/09/20

## 2020-10-09 NOTE — Addendum Note (Signed)
Encounter addended by: Burna Sis, LCSW on: 10/09/2020 3:43 PM  Actions taken: Clinical Note Signed

## 2020-10-09 NOTE — Progress Notes (Signed)
CSW met with pt in clinic to check in.  Was able to update patient that [employer] has submitted medicaid application on his behalf on 10/25.  They had said they would also refer to [facility] but pt has not heard anything regarding this- CSW will assist in following up.  CSW inquired how pt is doing at home without drinking.  Pt states he has been doing well and has not smoked a cigarette or had a sip of alcohol since hospital discharge.  Feels like he is doing really well not drinking and does not need additional support for this currently.  No further needs reported at this time   H. , LCSW Clinical Social Worker Advanced Heart Failure Clinic Desk#: [phone] Cell#: [phone] \  

## 2020-10-09 NOTE — Telephone Encounter (Signed)
Error

## 2020-10-09 NOTE — Patient Instructions (Signed)
START Farxiga 10 mg, one tab daily DECREASE Amiodarone to 200 mg, one tab daily   Labs today We will only contact you if something comes back abnormal or we need to make some changes. Otherwise no news is good news!  Labs needed in 7-10 days  Your physician recommends that you schedule a follow-up appointment in: 4 weeks with pharmacy team, 8 weeks  in the Advanced Practitioners (PA/NP) Clinic, and 12 weeks with Dr Gala Romney  If you have any questions or concerns before your next appointment please send [place] a message through [place] or call our office at [phone].    TO LEAVE A MESSAGE FOR THE NURSE SELECT OPTION 2, PLEASE LEAVE A MESSAGE INCLUDING: . YOUR NAME . DATE OF BIRTH . CALL BACK NUMBER . REASON FOR CALL**this is important as we prioritize the call backs  YOU WILL RECEIVE A CALL BACK THE SAME DAY AS LONG AS YOU CALL BEFORE 4:00 PM

## 2020-10-15 ENCOUNTER — Encounter: Payer: Self-pay | Admitting: Internal Medicine

## 2020-10-15 ENCOUNTER — Telehealth (INDEPENDENT_AMBULATORY_CARE_PROVIDER_SITE_OTHER): Payer: Self-pay | Admitting: Internal Medicine

## 2020-10-15 ENCOUNTER — Other Ambulatory Visit: Payer: Self-pay

## 2020-10-15 DIAGNOSIS — F32A Depression, unspecified: Secondary | ICD-10-CM

## 2020-10-15 DIAGNOSIS — I5022 Chronic systolic (congestive) heart failure: Secondary | ICD-10-CM

## 2020-10-15 DIAGNOSIS — F419 Anxiety disorder, unspecified: Secondary | ICD-10-CM

## 2020-10-15 DIAGNOSIS — Z7689 Persons encountering health services in other specified circumstances: Secondary | ICD-10-CM

## 2020-10-15 DIAGNOSIS — Z09 Encounter for follow-up examination after completed treatment for conditions other than malignant neoplasm: Secondary | ICD-10-CM

## 2020-10-15 DIAGNOSIS — J9601 Acute respiratory failure with hypoxia: Secondary | ICD-10-CM

## 2020-10-15 NOTE — Progress Notes (Signed)
Virtual Visit via Telephone Note  I connected with Antonio Tapia, on 10/15/2020 at 9:42 AM by telephone due to the COVID-19 pandemic and verified that I am speaking with the correct person using two identifiers.   Consent: I discussed the limitations, risks, security and privacy concerns of performing an evaluation and management service by telephone and the availability of in person appointments. I also discussed with the patient that there may be a patient responsible charge related to this service. The patient expressed understanding and agreed to proceed.   Location of Patient: Home   Location of Provider: Clinic    Persons participating in Telemedicine visit: Jeremaine Maraj [facility] Dr. Earlene Plater  Patient's girlfriend      History of Present Illness: Patient has a visit for hospital f/u and to re-establish care. See below for hospital course summary. Was admitted from 10/5-10/18 for acute on chronic systolic HF with cardiogenic shock and septic shock related to aspiration PNA. Patient reports he is feeling great and doesn't have "any of those symptoms anymore". Was seen by cardiology for f/u 10/27. Has lab visit tomorrow to monitor BMET. No problems with getting medications or medication compliance. Monitoring weight daily each morning. Reports has been stable. Has been between 255-257lbs for the past week. No chest pain or SOB.     HOSPITAL COURSE:  Antonio Tapia is a 39 year old with history of ETOH abuse, A flutter, tobacco abuse, and chronic systolic heart failure.   Admitted from the HF clinic with A/C decompensated heart failure and marked volume overload. On the day of admit he continued to decompensate with SVT. Underwent Emergent DC-CV and intubation. Placed on Norepi + milrinone+ amio drip. CCM consulted for acute respiratory failure.  Hospital course complicated by septic shock and AKI.  Gradually improved and RHC/LHC on 09/30/20 with normal coronaries, EF 40-45% and  well compensated filling pressures.   He will continue to be followed closely in the HF clinic. All meds provided through HF fund. Plan to check EKG, CBC, BMET at his follow up. See below for detailed problem list.       1. A/C Systolic HF -->Cardiogenic Shock + Septic Shock  - ECHO EF 20% with moderate RV failure. - Initially on pressors but gradually weaned off.  - Diuresed with IV lasix and later stopped. Overall diuresed 19 pounds.  - GDMT initiated.  -Continue spiro 25 mg daily  - Continue hydralazine 50 mg tid and imdur 30 mg daily.  -ContinueEntresto to 97/103 bid - Add SGT2i as an outpatient  - Had RHC/LHC on the day of d/c. This showed normal coronaries, EF 40-45%, and compensated filling pressures.   2. Aspiration PNA with septic shock - sputum cx + MSSA - CT chest 10/9 with dense RML/RLL effusion. No effusion - Bronch 10/11 with removal of secretions and upsize ETT -Treated with IV antibiotics. Completed antibiotic course.  3. Acute Respiratory Failure - Intubated 10/5. CCM following. - treat aspiration PNA as above - Self extubated 09/24/20 and eventually weaned off oxygen.   4. PAF - Has history of A flutter back in 2019. Off xarelto due cost for 1 year.  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  -Placed on amio drip and converted to NSR. Continue amio 200 mg twice a day. - On heparin prior to cath but will be on eliquis starting this evening (09/30/20)    5. SVT - S/p emergent cardioversion 10/5 - no change  5. AKI - Developed worsening renal  function in the setting of shock and ATN. - Creatinine peaked at 3.4 Creatinine improved with inotropic support.  - Creatinine now back down to 1.17  6. ETOH  - aware of need for cessation  7. Tobacco Abuse  - cessation advised   8. Hypokalemia -Resolved.  9. Hyponatremia  - Na 136  10. HTN - Improved with addition of GDMT.    Past Medical History:  Diagnosis Date  . Acute  congestive heart failure (HCC)   . Alcohol use   . Atrial flutter (HCC)   . Essential hypertension   . Tobacco use    No Known Allergies  Current Outpatient Medications on File Prior to Visit  Medication Sig Dispense Refill  . amiodarone (PACERONE) 200 MG tablet Take 1 tablet (200 mg total) by mouth daily. 30 tablet 3  . apixaban (ELIQUIS) 5 MG TABS tablet Take 1 tablet (5 mg total) by mouth 2 (two) times daily. 60 tablet 6  . carvedilol (COREG) 3.125 MG tablet Take 1 tablet (3.125 mg total) by mouth 2 (two) times daily with a meal. 60 tablet 6  . dapagliflozin propanediol (FARXIGA) 10 MG TABS tablet Take 1 tablet (10 mg total) by mouth daily before breakfast. 30 tablet 3  . hydrALAZINE (APRESOLINE) 50 MG tablet Take 1 tablet (50 mg total) by mouth every 8 (eight) hours. 90 tablet 6  . isosorbide mononitrate (IMDUR) 30 MG 24 hr tablet Take 1 tablet (30 mg total) by mouth daily. 30 tablet 6  . sacubitril-valsartan (ENTRESTO) 97-103 MG Take 1 tablet by mouth 2 (two) times daily. 60 tablet 6  . spironolactone (ALDACTONE) 25 MG tablet Take 1 tablet (25 mg total) by mouth daily. 30 tablet 6   No current facility-administered medications on file prior to visit.    Observations/Objective: NAD. Speaking clearly.  Work of breathing normal.  Alert and oriented. Mood appropriate.   Assessment and Plan: 1. Encounter to establish care Reviewed patient's PMH, social history, surgical history, and medications.   2. Hospital discharge follow-up Reviewed hospital course, current medications, ensured proper f/u in place, and addressed concerns.   3. Acute respiratory failure with hypoxia (HCC) Resolved. Patient monitoring pulse ox at home and reports numbers >90% without supplemental O2.   4. Anxiety and depression Has concerns about possible anxiety and depression. Would be interested in exploring treatment options. Used to self medicate with alcohol, now sober. Will have patient return for  GAD-7 and PHQ-9 for screening and further discussion of concerns and potential management.   5. Chronic systolic congestive heart failure (HCC) Stable. Followed by heart failure clinic. Discussed diet appropriate with history of CHF, particularly avoidance of Na. Discussed daily weights. Advised on symptoms/signs of acute CHF exacerbation.      Follow Up Instructions: F/u appointment for anxiety/depression    I discussed the assessment and treatment plan with the patient. The patient was provided an opportunity to ask questions and all were answered. The patient agreed with the plan and demonstrated an understanding of the instructions.   The patient was advised to call back or seek an in-person evaluation if the symptoms worsen or if the condition fails to improve as anticipated.     I provided 32 minutes total of non-face-to-face time during this encounter including median intraservice time, reviewing previous notes, investigations, ordering medications, medical decision making, coordinating care and patient verbalized understanding at the end of the visit.    Marcy Siren, D.O. Primary Care at [facility]  10/15/2020, 9:42 AM

## 2020-10-15 NOTE — Patient Instructions (Signed)
Thank you for choosing Primary Care at [facility] to be your medical home!    Suhail Peloquin was seen by De Hollingshead, DO today.   Bevelyn Buckles primary care provider is Marcy Siren, DO.   For the best care possible, you should try to see Marcy Siren, DO whenever you come to the clinic.   We look forward to seeing you again soon!  If you have any questions about your visit today, please call us at [phone] or feel free to reach your primary care provider via MyChart.

## 2020-10-16 ENCOUNTER — Telehealth (HOSPITAL_COMMUNITY): Payer: Self-pay | Admitting: Pharmacy Technician

## 2020-10-16 ENCOUNTER — Other Ambulatory Visit (HOSPITAL_COMMUNITY): Payer: Self-pay

## 2020-10-16 ENCOUNTER — Ambulatory Visit (HOSPITAL_COMMUNITY)
Admission: RE | Admit: 2020-10-16 | Discharge: 2020-10-16 | Disposition: A | Discharge status: Home / Self Care | Payer: Self-pay | Source: Ambulatory Visit | Attending: Internal Medicine | Admitting: Internal Medicine

## 2020-10-16 ENCOUNTER — Other Ambulatory Visit: Payer: Self-pay

## 2020-10-16 ENCOUNTER — Telehealth (HOSPITAL_COMMUNITY): Payer: Self-pay | Admitting: Licensed Clinical Social Worker

## 2020-10-16 DIAGNOSIS — I5022 Chronic systolic (congestive) heart failure: Secondary | ICD-10-CM | POA: Insufficient documentation

## 2020-10-16 LAB — BASIC METABOLIC PANEL
Anion gap: 12 (ref 5–15)
BUN: 14 mg/dL (ref 6–20)
CO2: 23 mmol/L (ref 22–32)
Calcium: 9 mg/dL (ref 8.9–10.3)
Chloride: 103 mmol/L (ref 98–111)
Creatinine, Ser: 1.04 mg/dL (ref 0.61–1.24)
GFR, Estimated: >60 mL/min (ref >60)
Glucose, Bld: 97 mg/dL (ref 70–99)
Potassium: 4 mmol/L (ref 3.5–5.1)
Sodium: 138 mmol/L (ref 135–145)

## 2020-10-16 NOTE — Progress Notes (Signed)
Paramedicine Encounter    Patient ID: Antonio Tapia, male    DOB: April 11, 1981, 39 y.o.   MRN: [id number]   Patient Care Team: Arvilla Market, DO as PCP - General (Family Medicine)  Patient Active Problem List   Diagnosis Date Noted  . Hypoxia   . Acute respiratory failure with hypoxia (HCC)   . Acute on chronic systolic (congestive) heart failure (HCC) 09/17/2020  . CHF (congestive heart failure) (HCC) 10/27/2018  . Acute congestive heart failure (HCC)   . Typical atrial flutter (HCC)   . Essential hypertension     Current Outpatient Medications:  .  amiodarone (PACERONE) 200 MG tablet, Take 1 tablet (200 mg total) by mouth daily., Disp: 30 tablet, Rfl: 3 .  apixaban (ELIQUIS) 5 MG TABS tablet, Take 1 tablet (5 mg total) by mouth 2 (two) times daily., Disp: 60 tablet, Rfl: 6 .  carvedilol (COREG) 3.125 MG tablet, Take 1 tablet (3.125 mg total) by mouth 2 (two) times daily with a meal., Disp: 60 tablet, Rfl: 6 .  dapagliflozin propanediol (FARXIGA) 10 MG TABS tablet, Take 1 tablet (10 mg total) by mouth daily before breakfast., Disp: 30 tablet, Rfl: 3 .  hydrALAZINE (APRESOLINE) 50 MG tablet, Take 1 tablet (50 mg total) by mouth every 8 (eight) hours., Disp: 90 tablet, Rfl: 6 .  isosorbide mononitrate (IMDUR) 30 MG 24 hr tablet, Take 1 tablet (30 mg total) by mouth daily., Disp: 30 tablet, Rfl: 6 .  sacubitril-valsartan (ENTRESTO) 97-103 MG, Take 1 tablet by mouth 2 (two) times daily., Disp: 60 tablet, Rfl: 6 .  spironolactone (ALDACTONE) 25 MG tablet, Take 1 tablet (25 mg total) by mouth daily., Disp: 30 tablet, Rfl: 6 No Known Allergies    Social History   Socioeconomic History  . Marital status: Single    Spouse name: Not on file  . Number of children: Not on file  . Years of education: Not on file  . Highest education level: Not on file  Occupational History  . Not on file  Tobacco Use  . Smoking status: Current Every Day Smoker  . Smokeless tobacco: Never Used   Substance and Sexual Activity  . Alcohol use: Yes    Comment: 1 pint liquor/day  . Drug use: No  . Sexual activity: Not on file  Other Topics Concern  . Not on file  Social History Narrative  . Not on file   Social Determinants of Health   Financial Resource Strain: Low Risk   . Difficulty of Paying Living Expenses: Not very hard  Food Insecurity: No Food Insecurity  . Worried About Programme researcher, broadcasting/film/video in the Last Year: Never true  . Ran Out of Food in the Last Year: Never true  Transportation Needs: No Transportation Needs  . Lack of Transportation (Medical): No  . Lack of Transportation (Non-Medical): No  Physical Activity:   . Days of Exercise per Week: Not on file  . Minutes of Exercise per Session: Not on file  Stress:   . Feeling of Stress : Not on file  Social Connections:   . Frequency of Communication with Friends and Family: Not on file  . Frequency of Social Gatherings with Friends and Family: Not on file  . Attends Religious Services: Not on file  . Active Member of Clubs or Organizations: Not on file  . Attends Banker Meetings: Not on file  . Marital Status: Not on file  Intimate Partner Violence:   . Fear of  Current or Ex-Partner: Not on file  . Emotionally Abused: Not on file  . Physically Abused: Not on file  . Sexually Abused: Not on file    Physical Exam Cardiovascular:     Rate and Rhythm: Normal rate and regular rhythm.     Pulses: Normal pulses.  Pulmonary:     Effort: Pulmonary effort is normal.     Breath sounds: Normal breath sounds.  Musculoskeletal:        General: Normal range of motion.     Right lower leg: No edema.     Left lower leg: No edema.  Skin:    General: Skin is warm and dry.     Capillary Refill: Capillary refill takes less than 2 seconds.  Neurological:     Mental Status: He is alert and oriented to person, place, and time.  Psychiatric:        Mood and Affect: Mood normal.         Future  Appointments  Date Time Provider Department Center  10/16/2020  2:30 PM [facility] LAB [facility] None  10/21/2020  4:10 PM Arvilla Market, DO [facility] None  11/06/2020 12:00 PM [facility] PHARMACY [facility] None  12/16/2020  3:00 PM [facility] PA/NP [facility] None  01/15/2021  3:00 PM Bensimhon, Bevelyn Buckles, MD [facility] None    BP (!) 129/92 (BP Location: Left Arm, Patient Position: Sitting, Cuff Size: Large)   Pulse 86   Resp 16   Wt 254 lb (115.2 kg)   SpO2 97%   BMI 31.95 kg/m   Weight yesterday- 255 lb Last visit weight- 259 lb  Antonio Tapia was seen at home today and reported feeling generally well. He denied chest pain, SOB, headache, dizziness, orthopnea, fever or cough over the past week. He reported being compliant with his medications and his weight has been trending down. Today we discussed each of his medications and why he takes them. We discussed heart failure at length and he seemed to gain a little more understanding of his disease process. He was changed from Xarelto to Eliquis but does not have insurance so I contacted Zachery Dauer with the pharmacy team at the HF clinic and she prepared the assistance paperwork. I will see him tomorrow and have him sign the necessary papers. His medications were verified and his pillbox was refilled.    Jacqualine Code, EMT 10/16/20  ACTION: Home visit completed Next visit planned for 1 week

## 2020-10-16 NOTE — Telephone Encounter (Signed)
Patient is currently uninsured and started on Eliquis. Started an application for [employer] patient assistance.  Will fax once signatures are obtained.

## 2020-10-16 NOTE — Telephone Encounter (Signed)
CSW received call from [employer] regarding adding patient name to upcoming nutrition pilot to assist with healthy eating education- CSW emailed colleague to place on list of perspective participants.  CSW then inquired with patient if he remembered his appt today with [facility] to apply for disability.  Pt has misplaced his appt info so CSW able to speak with Layla Barter at [facility] and confirm appt information (2pm this afternoon).  [facility] made [facility] and [facility] to confirm [facility]t details.  Will continue to follow and assist as needed  Burna Sis, LCSW Clinical Social Worker Advanced [facility] Failure Clinic Desk#: [phone] Cell#: [phone]

## 2020-10-21 ENCOUNTER — Ambulatory Visit: Payer: Self-pay | Admitting: Internal Medicine

## 2020-10-23 ENCOUNTER — Other Ambulatory Visit (HOSPITAL_COMMUNITY): Payer: Self-pay

## 2020-10-23 NOTE — Progress Notes (Signed)
Paramedicine Encounter    Patient ID: Anchor Dwan, male    DOB: 1981/02/06, 39 y.o.   MRN: [id number]   Patient Care Team: Arvilla Market, DO as PCP - General (Family Medicine)  Patient Active Problem List   Diagnosis Date Noted  . Hypoxia   . Acute respiratory failure with hypoxia (HCC)   . Acute on chronic systolic (congestive) heart failure (HCC) 09/17/2020  . CHF (congestive heart failure) (HCC) 10/27/2018  . Acute congestive heart failure (HCC)   . Typical atrial flutter (HCC)   . Essential hypertension     Current Outpatient Medications:  .  amiodarone (PACERONE) 200 MG tablet, Take 1 tablet (200 mg total) by mouth daily., Disp: 30 tablet, Rfl: 3 .  apixaban (ELIQUIS) 5 MG TABS tablet, Take 1 tablet (5 mg total) by mouth 2 (two) times daily., Disp: 60 tablet, Rfl: 6 .  carvedilol (COREG) 3.125 MG tablet, Take 1 tablet (3.125 mg total) by mouth 2 (two) times daily with a meal., Disp: 60 tablet, Rfl: 6 .  dapagliflozin propanediol (FARXIGA) 10 MG TABS tablet, Take 1 tablet (10 mg total) by mouth daily before breakfast., Disp: 30 tablet, Rfl: 3 .  hydrALAZINE (APRESOLINE) 50 MG tablet, Take 1 tablet (50 mg total) by mouth every 8 (eight) hours., Disp: 90 tablet, Rfl: 6 .  isosorbide mononitrate (IMDUR) 30 MG 24 hr tablet, Take 1 tablet (30 mg total) by mouth daily., Disp: 30 tablet, Rfl: 6 .  sacubitril-valsartan (ENTRESTO) 97-103 MG, Take 1 tablet by mouth 2 (two) times daily., Disp: 60 tablet, Rfl: 6 .  spironolactone (ALDACTONE) 25 MG tablet, Take 1 tablet (25 mg total) by mouth daily., Disp: 30 tablet, Rfl: 6 No Known Allergies    Social History   Socioeconomic History  . Marital status: Single    Spouse name: Not on file  . Number of children: Not on file  . Years of education: Not on file  . Highest education level: Not on file  Occupational History  . Not on file  Tobacco Use  . Smoking status: Current Every Day Smoker  . Smokeless tobacco: Never Used   Substance and Sexual Activity  . Alcohol use: Yes    Comment: 1 pint liquor/day  . Drug use: No  . Sexual activity: Not on file  Other Topics Concern  . Not on file  Social History Narrative  . Not on file   Social Determinants of Health   Financial Resource Strain: Low Risk   . Difficulty of Paying Living Expenses: Not very hard  Food Insecurity: No Food Insecurity  . Worried About Programme researcher, broadcasting/film/video in the Last Year: Never true  . Ran Out of Food in the Last Year: Never true  Transportation Needs: No Transportation Needs  . Lack of Transportation (Medical): No  . Lack of Transportation (Non-Medical): No  Physical Activity:   . Days of Exercise per Week: Not on file  . Minutes of Exercise per Session: Not on file  Stress:   . Feeling of Stress : Not on file  Social Connections:   . Frequency of Communication with Friends and Family: Not on file  . Frequency of Social Gatherings with Friends and Family: Not on file  . Attends Religious Services: Not on file  . Active Member of Clubs or Organizations: Not on file  . Attends Banker Meetings: Not on file  . Marital Status: Not on file  Intimate Partner Violence:   . Fear of  Current or Ex-Partner: Not on file  . Emotionally Abused: Not on file  . Physically Abused: Not on file  . Sexually Abused: Not on file    Physical Exam Cardiovascular:     Rate and Rhythm: Normal rate and regular rhythm.     Pulses: Normal pulses.  Pulmonary:     Effort: Pulmonary effort is normal.     Breath sounds: Normal breath sounds.  Musculoskeletal:        General: Normal range of motion.     Right lower leg: No edema.     Left lower leg: No edema.  Skin:    General: Skin is warm and dry.     Capillary Refill: Capillary refill takes less than 2 seconds.  Neurological:     Mental Status: He is alert and oriented to person, place, and time.  Psychiatric:        Mood and Affect: Mood normal.         Future  Appointments  Date Time Provider Department Center  10/24/2020  4:10 PM Arvilla Market, DO [facility] None  11/06/2020 12:00 PM [facility] PHARMACY [facility] None  12/16/2020  3:00 PM [facility] PA/NP [facility] None  01/15/2021  3:00 PM Bensimhon, Bevelyn Buckles, MD [facility] None    BP (!) 141/95 (BP Location: Left Arm, Patient Position: Sitting, Cuff Size: Large)   Pulse 92   Resp 16   Wt 256 lb (116.1 kg)   SpO2 99%   BMI 32.20 kg/m   Weight yesterday- 255 lb Last visit weight- 254 lb  Mr Witty was seen at his parent's house today and reported feeling well. He denied chest pain, SOB, headache, dizziness, orthopnea, fever or cough. He stated he has been compliant with his medications over the past week and his weight has been stable. His blood pressure was slightly elevated today but he stated he had not taken his afternoon hydralazine because he left his pillbox at home. We will think on a way to fix this issue moving forward. He reported that he has been sleeping well but when asked about snoring he stated he does snore, just not a bad as he used to before he was in the hospital. He is without insurance currently but when that is resolved I will speak to the clinic about setting him up for a sleep study. I will follow up next week.   Jacqualine Code, EMT 10/23/20  ACTION: Home visit completed Next visit planned for 1 week

## 2020-10-24 ENCOUNTER — Ambulatory Visit (INDEPENDENT_AMBULATORY_CARE_PROVIDER_SITE_OTHER): Payer: Self-pay | Admitting: Internal Medicine

## 2020-10-24 ENCOUNTER — Other Ambulatory Visit: Payer: Self-pay

## 2020-10-24 ENCOUNTER — Encounter: Payer: Self-pay | Admitting: Internal Medicine

## 2020-10-24 VITALS — BP 128/86 | HR 84 | Temp 97.2°F | Resp 17 | Wt 263.0 lb

## 2020-10-24 DIAGNOSIS — F32A Depression, unspecified: Secondary | ICD-10-CM

## 2020-10-24 DIAGNOSIS — F419 Anxiety disorder, unspecified: Secondary | ICD-10-CM

## 2020-10-24 MED ORDER — CARVEDILOL 3.125 MG PO TABS
3.1250 mg | ORAL_TABLET | Freq: Two times a day (BID) | ORAL | 6 refills | Status: DC
Start: 2020-10-24 — End: 2020-10-31

## 2020-10-24 MED ORDER — SERTRALINE HCL 25 MG PO TABS: ORAL_TABLET | ORAL | 1 refills | Status: DC

## 2020-10-24 MED ORDER — ISOSORBIDE MONONITRATE ER 30 MG PO TB24
30.0000 mg | ORAL_TABLET | Freq: Every day | ORAL | 6 refills | Status: DC
Start: 2020-10-24 — End: 2020-10-31

## 2020-10-24 MED ORDER — SPIRONOLACTONE 25 MG PO TABS
25.0000 mg | ORAL_TABLET | Freq: Every day | ORAL | 6 refills | Status: DC
Start: 2020-10-24 — End: 2020-10-31

## 2020-10-24 NOTE — Progress Notes (Signed)
°  Subjective:    Antonio Tapia - 39 y.o. male MRN [id number]  Date of birth: May 14, 1981  HPI  Antonio Tapia is here for concerns about anxiety and depression. Patient reports he is interested in starting a medication. Would also be open to counseling.   Depression screen [facility] 2/9 10/24/2020 10/15/2020 10/10/2018  Decreased Interest 1 0 0  Down, Depressed, Hopeless 0 0 0  PHQ - 2 Score 1 0 0  Altered sleeping 3 - 3  Tired, decreased energy 3 - 0  Change in appetite 2 - 3  Feeling bad or failure about yourself  2 - 3  Trouble concentrating 0 - 3  Moving slowly or fidgety/restless 3 - 0  Suicidal thoughts 0 - 0  PHQ-9 Score 14 - 12   GAD 7 : Generalized Anxiety Score 10/24/2020 10/10/2018  Nervous, Anxious, on Edge 2 0  Control/stop worrying 1 0  Worry too much - different things 1 0  Trouble relaxing 2 0  Restless 2 0  Easily annoyed or irritable 0 0  Afraid - awful might happen 0 0  Total GAD 7 Score 8 0       Health Maintenance:  Health Maintenance Due  Topic Date Due   Hepatitis C Screening  Never done    -  reports that he has been smoking. He has never used smokeless tobacco. - Review of Systems: Per HPI. - Past Medical History: Patient Active Problem List   Diagnosis Date Noted   Hypoxia    Acute respiratory failure with hypoxia (HCC)    Acute on chronic systolic (congestive) heart failure (HCC) 09/17/2020   CHF (congestive heart failure) (HCC) 10/27/2018   Acute congestive heart failure (HCC)    Typical atrial flutter (HCC)    Essential hypertension    - Medications: reviewed and updated   Objective:   Physical Exam BP 128/86    Pulse 84    Temp (!) 97.2 F (36.2 C) (Temporal)    Resp 17    Wt 263 lb (119.3 kg)    SpO2 97%    BMI 33.08 kg/m  Physical Exam Constitutional:      General: He is not in acute distress.    Appearance: He is not diaphoretic.  Cardiovascular:     Rate and Rhythm: Normal rate.  Pulmonary:     Effort: Pulmonary effort is  normal. No respiratory distress.  Musculoskeletal:        General: Normal range of motion.  Skin:    General: Skin is warm and dry.  Neurological:     Mental Status: He is alert and oriented to person, place, and time.  Psychiatric:        Mood and Affect: Affect normal.        Judgment: Judgment normal.            Assessment & Plan:   1. Anxiety and depression PHQ-9 score consistent with moderate depression and GAD-7 score consistent with mild anxiety. Will start Zoloft. Discussed how to take medication, potential side effects, time to expected efficacy. Discussed dual benefit of counseling with medication. Agreeable for f/u with Jenel Lucks, LCSW. Follow up in 4-6 weeks with this provider.  - sertraline (ZOLOFT) 25 MG tablet; Take one tablet daily. If tolerating, increase to two tablets in one week.  Dispense: 60 tablet; Refill: 1      Marcy Siren, D.O. 11/06/2020, 9:36 AM Primary Care at [facility]

## 2020-10-25 NOTE — Telephone Encounter (Signed)
Sent in application via fax Wednesday 11/10.  Will follow up.

## 2020-10-28 NOTE — Telephone Encounter (Signed)
Called [employer] to check the status of the patient's application. Representative stated that the patient portion is missing.   Will refax.

## 2020-10-29 ENCOUNTER — Ambulatory Visit (INDEPENDENT_AMBULATORY_CARE_PROVIDER_SITE_OTHER): Payer: Self-pay | Admitting: Licensed Clinical Social Worker

## 2020-10-29 ENCOUNTER — Other Ambulatory Visit: Payer: Self-pay

## 2020-10-29 DIAGNOSIS — F4323 Adjustment disorder with mixed anxiety and depressed mood: Secondary | ICD-10-CM

## 2020-10-29 NOTE — BH Specialist Note (Signed)
Integrated Behavioral Health Visit via Telemedicine (Telephone)  10/29/2020 Antonio Tapia [id number]  Number of Integrated Behavioral Health visits: 1 Session Start time: 3:40 PM  Session End time: 4:00 PM Total time: 20 minutes  Referring Provider: Dr. Earlene Plater Type of Service: Individual Patient location: Home [facility] Provider location: Office All persons participating in visit: LCSW, Patient, Antonio Tapia [phone] (emergency contact)    I connected with Antonio Tapia  by telephone and verified that I am speaking with the correct person using two identifiers.   Discussed confidentiality: Yes   Confirmed demographics & insurance:  Yes   I discussed that engaging in this virtual visit, they consent to the provision of behavioral healthcare and the services will be billed under their insurance.   Patient and/or legal guardian expressed understanding and consented to virtual visit: Yes   PRESENTING CONCERNS: Patient or family reports the following symptoms/concerns: Pt reports difficulty managing ADD and depression and anxiety symptoms triggered by recent hospitalization Duration of problem: 1 month; Severity of problem: moderate  STRENGTHS (Protective Factors/Coping Skills): Social connections, Social and Emotional competence, Concrete supports in place (healthy food, safe environments, etc.) and Sense of purpose Haven't smoked in a month and had an occasional drink  Participating in [employer] and [facility]  ASSESSMENT: Patient currently experiencing increase in depression and anxiety symptoms triggered by stress associated with recent hospitalization.  Pt will benefit from continued medication management and therapy.    GOALS ADDRESSED: Patient will: 1.  Reduce symptoms of: anxiety, depression and stress  Pt agreed to comply with medication management 2.  Increase knowledge and/or ability of: coping skills Pt agreed to utilize healthy coping skills 3.   Demonstrate ability to: Increase healthy adjustment to current life circumstances and Increase adequate support systems for patient/family Pt agreed to apply for [facility] Financial Assistance, in the case he is not awarded [employer]  Progress of Goals: Ongoing  INTERVENTIONS: Interventions utilized:  Solution-Focused Strategies, Supportive Counseling and Link to [employer] Assessments completed & reviewed: Not Needed   OUTCOME: Patient Response: Pt was engaged during session and identified strategies to assist with management of symptoms. LCSW messaged PCP regarding side effects (headaches) to medication, per pt request.    PLAN: 1. Follow up with behavioral health clinician on : Contact LCSW with any additional behavioral health and/or resource needs 2. Behavioral recommendations: Utilize strategies and resources discussed. Comply with med management 3. Referral(s): Integrated Art gallery manager (In Clinic) and [employer] [facility] (LME/Outside Clinic)  I discussed the assessment and treatment plan with the patient and/or parent/guardian. They were provided an opportunity to ask questions and all were answered. They agreed with the plan and demonstrated an understanding of the instructions.   They were advised to call back or seek an in-person evaluation as appropriate.  I discussed that the purpose of this visit is to provide behavioral health care while limiting exposure to the novel coronavirus.  Discussed there is a possibility of technology failure and discussed alternative modes of communication if that failure occurs.  Bridgett Larsson, LCSW 11/05/2020 10:19 AM

## 2020-10-29 NOTE — Telephone Encounter (Signed)
Re-faxed application to [employer].   Will follow up.

## 2020-10-30 ENCOUNTER — Other Ambulatory Visit (HOSPITAL_COMMUNITY): Payer: Self-pay | Admitting: Internal Medicine

## 2020-10-30 NOTE — Telephone Encounter (Signed)
Advanced Heart Failure Patient Advocate Encounter   Patient was approved to receive Eliquis from [employer]  Patient ID: [id number] Effective dates: 10/30/20 through 10/29/20  Called and spoke with patient regarding approval.  Archer Asa, CPhT

## 2020-10-31 ENCOUNTER — Other Ambulatory Visit (HOSPITAL_COMMUNITY): Payer: Self-pay | Admitting: *Deleted

## 2020-10-31 MED ORDER — DAPAGLIFLOZIN PROPANEDIOL 10 MG PO TABS
10.0000 mg | ORAL_TABLET | Freq: Every day | ORAL | 3 refills | Status: DC
Start: 2020-10-31 — End: 2021-03-27

## 2020-10-31 MED ORDER — SPIRONOLACTONE 25 MG PO TABS
25.0000 mg | ORAL_TABLET | Freq: Every day | ORAL | 6 refills | Status: DC
Start: 2020-10-31 — End: 2021-03-27

## 2020-10-31 MED ORDER — CARVEDILOL 3.125 MG PO TABS
3.1250 mg | ORAL_TABLET | Freq: Two times a day (BID) | ORAL | 6 refills | Status: DC
Start: 2020-10-31 — End: 2020-11-14

## 2020-10-31 MED ORDER — AMIODARONE HCL 200 MG PO TABS
200.0000 mg | ORAL_TABLET | Freq: Every day | ORAL | 3 refills | Status: DC
Start: 2020-10-31 — End: 2021-03-27

## 2020-10-31 MED ORDER — ISOSORBIDE MONONITRATE ER 30 MG PO TB24
30.0000 mg | ORAL_TABLET | Freq: Every day | ORAL | 6 refills | Status: DC
Start: 2020-10-31 — End: 2021-03-27

## 2020-10-31 MED ORDER — HYDRALAZINE HCL 50 MG PO TABS
50.0000 mg | ORAL_TABLET | Freq: Three times a day (TID) | ORAL | 6 refills | Status: DC
Start: 2020-10-31 — End: 2021-03-27

## 2020-10-31 MED FILL — CARVEDILOL 3.125 MG TABLET: 3.125 | 34 days supply | Qty: 68 | Fill #0

## 2020-10-31 MED FILL — SPIRONOLACTONE 25 MG TABS: 25 | 34 days supply | Qty: 34 | Fill #0

## 2020-10-31 MED FILL — FARXIGA 10 MG TABLET: 10 | 34 days supply | Qty: 34 | Fill #0

## 2020-10-31 MED FILL — AMIODARONE HCL 200 MG TAB: 200 | 34 days supply | Qty: 34 | Fill #0

## 2020-10-31 MED FILL — hydrALAZINE HCL 50 MG TABS: 50 | 34 days supply | Qty: 102 | Fill #0

## 2020-10-31 MED FILL — ISOSORBIDE MN ER 30 MG TAB: 30 | 34 days supply | Qty: 34 | Fill #0

## 2020-11-01 ENCOUNTER — Other Ambulatory Visit (HOSPITAL_COMMUNITY): Payer: Self-pay

## 2020-11-01 NOTE — Progress Notes (Signed)
Paramedicine Encounter    Patient ID: Antonio Tapia, male    DOB: January 25, 1981, 39 y.o.   MRN: [id number]   Patient Care Team: Arvilla Market, DO as PCP - General (Family Medicine)  Patient Active Problem List   Diagnosis Date Noted  . Hypoxia   . Acute respiratory failure with hypoxia (HCC)   . Acute on chronic systolic (congestive) heart failure (HCC) 09/17/2020  . CHF (congestive heart failure) (HCC) 10/27/2018  . Acute congestive heart failure (HCC)   . Typical atrial flutter (HCC)   . Essential hypertension     Current Outpatient Medications:  .  amiodarone (PACERONE) 200 MG tablet, Take 1 tablet (200 mg total) by mouth daily., Disp: 30 tablet, Rfl: 3 .  apixaban (ELIQUIS) 5 MG TABS tablet, Take 1 tablet (5 mg total) by mouth 2 (two) times daily., Disp: 60 tablet, Rfl: 6 .  carvedilol (COREG) 3.125 MG tablet, Take 1 tablet (3.125 mg total) by mouth 2 (two) times daily with a meal., Disp: 60 tablet, Rfl: 6 .  dapagliflozin propanediol (FARXIGA) 10 MG TABS tablet, Take 1 tablet (10 mg total) by mouth daily before breakfast., Disp: 30 tablet, Rfl: 3 .  hydrALAZINE (APRESOLINE) 50 MG tablet, Take 1 tablet (50 mg total) by mouth every 8 (eight) hours., Disp: 90 tablet, Rfl: 6 .  isosorbide mononitrate (IMDUR) 30 MG 24 hr tablet, Take 1 tablet (30 mg total) by mouth daily., Disp: 30 tablet, Rfl: 6 .  sacubitril-valsartan (ENTRESTO) 97-103 MG, Take 1 tablet by mouth 2 (two) times daily., Disp: 60 tablet, Rfl: 6 .  sertraline (ZOLOFT) 25 MG tablet, Take one tablet daily. If tolerating, increase to two tablets in one week., Disp: 60 tablet, Rfl: 1 .  spironolactone (ALDACTONE) 25 MG tablet, Take 1 tablet (25 mg total) by mouth daily., Disp: 30 tablet, Rfl: 6 No Known Allergies    Social History   Socioeconomic History  . Marital status: Single    Spouse name: Not on file  . Number of children: Not on file  . Years of education: Not on file  . Highest education level: Not on  file  Occupational History  . Not on file  Tobacco Use  . Smoking status: Current Every Day Smoker  . Smokeless tobacco: Never Used  Substance and Sexual Activity  . Alcohol use: Yes    Comment: 1 pint liquor/day  . Drug use: No  . Sexual activity: Not on file  Other Topics Concern  . Not on file  Social History Narrative  . Not on file   Social Determinants of Health   Financial Resource Strain: Low Risk   . Difficulty of Paying Living Expenses: Not very hard  Food Insecurity: No Food Insecurity  . Worried About Programme researcher, broadcasting/film/video in the Last Year: Never true  . Ran Out of Food in the Last Year: Never true  Transportation Needs: No Transportation Needs  . Lack of Transportation (Medical): No  . Lack of Transportation (Non-Medical): No  Physical Activity:   . Days of Exercise per Week: Not on file  . Minutes of Exercise per Session: Not on file  Stress:   . Feeling of Stress : Not on file  Social Connections:   . Frequency of Communication with Friends and Family: Not on file  . Frequency of Social Gatherings with Friends and Family: Not on file  . Attends Religious Services: Not on file  . Active Member of Clubs or Organizations: Not on file  .  Attends Banker Meetings: Not on file  . Marital Status: Not on file  Intimate Partner Violence:   . Fear of Current or Ex-Partner: Not on file  . Emotionally Abused: Not on file  . Physically Abused: Not on file  . Sexually Abused: Not on file    Physical Exam Cardiovascular:     Rate and Rhythm: Normal rate and regular rhythm.     Pulses: Normal pulses.  Pulmonary:     Effort: Pulmonary effort is normal.     Breath sounds: Normal breath sounds.  Musculoskeletal:        General: Normal range of motion.     Right lower leg: No edema.     Left lower leg: No edema.  Skin:    General: Skin is warm and dry.     Capillary Refill: Capillary refill takes less than 2 seconds.  Neurological:     Mental Status:  He is alert and oriented to person, place, and time.  Psychiatric:        Mood and Affect: Mood normal.         Future Appointments  Date Time Provider Department Center  11/06/2020 12:00 PM [facility] None  12/05/2020  1:30 PM Arvilla Market, DO [facility] None  12/16/2020  3:00 PM [facility] PA/NP [facility] None  01/15/2021  3:00 PM Bensimhon, Bevelyn Buckles, MD [facility] None    BP (!) 150/99 (BP Location: Left Arm, Patient Position: Sitting, Cuff Size: Large)   Pulse 84   Resp 16   Wt 256 lb (116.1 kg)   SpO2 99%   BMI 32.20 kg/m   Weight yesterday- 257 lb Last visit weight- 263 lb  Antonio Tapia was seen at home today and rpeorted feeling well. He denied chest pain, SOB, headache, dizziness, orthopnea, fever or cough. He stated he has been taking his medications with the exception of carvedilol and isosorbide which he ran out of yesterday. I contacted the pharmacy and they advised all his medications were available for pick up and he could come get them before 18:00. I relayed this information and he state he would go immediately after I left. His pillbox was checked for accuracy and a few small errors were noted. We will continue to work on this moving forward. I will follow up in two weeks.  Jacqualine Code, EMT 11/01/20  ACTION: Home visit completed Next visit planned for 2 weeks

## 2020-11-04 ENCOUNTER — Telehealth: Payer: Self-pay

## 2020-11-04 NOTE — Telephone Encounter (Signed)
Left voice mail to call back 

## 2020-11-04 NOTE — Telephone Encounter (Signed)
-----  Message from Nicolette Bang, DO sent at 11/01/2020  8:45 AM EST ----- Regarding: FW: Medication Side Effect Can you please call patient to follow up on his side effects. This can be a typical side effect when first starting the medication. Would recommend taking with meal in AM. If already doing this and still having headaches, could try to switch to taking Rx at nighttime.  ----- Message ----- From: Rebekah Chesterfield, LCSW Sent: 10/29/2020   5:15 PM EST To: Nicolette Bang, DO Subject: Medication Side Effect                         Hi Dr. Juleen China! I met with the patient and his girlfriend today for an initial [place] appointment. Pt endorsed moderate-severe headaches after taking sertraline in the mornings. He has taken 3 doses, so far and would like for you to advise on next steps. Thanks so much.

## 2020-11-06 ENCOUNTER — Telehealth (HOSPITAL_COMMUNITY): Payer: Self-pay

## 2020-11-06 ENCOUNTER — Inpatient Hospital Stay (HOSPITAL_COMMUNITY)
Admission: RE | Admit: 2020-11-06 | Discharge: 2020-11-06 | Disposition: A | Discharge status: Home / Self Care | Payer: Self-pay | Source: Ambulatory Visit

## 2020-11-06 NOTE — Telephone Encounter (Signed)
I called Mr Arko to remind him of his appointment today. He stated he was aware and would be there at 12:00. I asked if he would need a ride and he said no. I will try to be at his appointment but will follow up next week if I am unable to make it.  Jacqualine Code, EMT 11/06/20

## 2020-11-06 NOTE — Progress Notes (Signed)
Referring Physician: PYK:DXIPJA, Godfrey Pick, FNP PCP-Cardiologist:Dr. Gala Romney  HPI:  Antonio Tapia is a 39 year old with history of ETOH abuse, atrial flutter, tobacco abuse, and chronic systolic heart failure.   Admitted from the HF clinicon 10/5/21with A/C decompensated heart failure and marked volume overload. On the day of admit, he continued to decompensate withSVT. He underwent emergent DC-CV and intubation. Placed on norepinephrine, milrinone, and amiodarone infusions.CCM was consulted for acute respiratory failure. Hospital course was complicated by septic shockfrom aspiration PNAand AKI. Gradually improved and RHC/LHC on 09/30/20 with normal coronaries, EF 40-45% and well compensated filling pressures. RV normal on ECHO. Discharge weight was 261 lbs.   He returned to clinic on 10/09/20 for post-hospital follow-up with Antonio Lis, PA.He was doing well.Weight wasstable, down 3 lbs since discharge. Denied dyspnea w/ ADLs. No orthopnea, PND or LEE. Denied palpitations. NSR on EKG. BP was well controlled. Reported full med compliance with no intolerances or side effects. He reported that he had not smoked or consumed ETOH since hospital discharge.   Today he returns to HF clinic for pharmacist medication titration. At last visit with APP clinic, Farxiga 10 mg daily was added. Overall he is feeling well today. No dizziness, lightheadedness, chest pain or palpitations. No SOB/DOE. Can walk 30-45 minutes before feeling fatigued. Weight increased ~10 lbs from last visit, but he believes this is from his diet. Notes weights have been stable on home scale. Not taking any diuretic. No LEE, PND or orthopnea. Taking all medications as prescribed and tolerating all medications.     HF Medications: Carvedilol 3.125 mg BID Entresto 97/103 mg BID Spironolactone 25 mg daily Hydralazine 50 mg TID Isosorbide mononitrate 30 mg daily Farxiga 10 mg daily  Has the patient been  experiencing any side effects to the medications prescribed?  no  Does the patient have any problems obtaining medications due to transportation or finances?   no insurance: Eliquis from [employer] and Entresto from [employer]. Uses Heart Failure fund through [facility] for other heart failure medications.   Understanding of regimen: good Understanding of indications: good Potential of compliance: good - Followed by paramedicine Timothy Lasso) Patient understands to avoid NSAIDs. Patient understands to avoid decongestants.   Pertinent Lab Values (10/16/20):  Serum creatinine 1.04, BUN 14, Potassium 4, Sodium 138   Vital Signs:  Weight: 270.4 lbs (last clinic weight: 259.4 lbs)  Blood pressure: 138/68   Heart rate: 91   Assessment: 1. 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 - recent admit for a/c decompensated HF 10/21 c/b SVT leading to cardiogenic shock  - Echo 10/21: EF 40-45%, RV normal. - R/LHC showednormal coronaries, EF 40-45%, and compensated filling pressures.Fick cardiac output/index = 8.2/3.3. D/c Wt 261 lb - NYHA class II. Euvolemic on exam.  - Increase carvedilol to 6.25 mg BID.  - Continue Entresto 97/103 mg BID - Continue spironolactone 25 mg daily - Continue hydralazine 50 mg TID - Continue isosorbide mononitrate 30 mg daily - Continue Farxiga 10 mg daily - Continue paramedicine to help w/ med compliance   2. SVT - Recently admitted 10/21 w/ hemodynamically unstable SVT requiring emergent DCCV  - Most recent EKG showed NSR, denied palpitations - Increase carvedilol as above - He has quit drinking, which should help reduce risk of recurrence  - Prior plans noted to refer to EP for ablation if SVT recurs  3. PAF - NSR on recent  EKG  - Continue Eliquis for stroke prophylaxis.  - Plans noted for sleep study once he has insurance (h/o snoring and  HTN)  4. HTN: BP elevated to 138/68 in clinic today but he did not take his morning medications before appointment.  - Increase carvedilol to 6.25 mg BID.  - Continue Entresto 97/103 mg BID - Continue spironolactone 25 mg daily - Continue hydralazine 50 mg TID and Imdur 30 mg daily.   5. ETOH Abuse - He has not had alcohol since his discharge - Congratulated on efforts   6. Tobacco Abuse  - He has quit smoking - Congratulated on efforts   Plan: 1) Medication changes: Based on clinical presentation, vital signs and recent labs will increase carvedilol to 6.25 mg BID 2) Follow-up: 1 month with [facility]   Karle Plumber, PharmD, BCPS, BCCP, CPP Heart Failure Clinic Pharmacist [phone]

## 2020-11-13 ENCOUNTER — Telehealth (HOSPITAL_COMMUNITY): Payer: Self-pay

## 2020-11-13 NOTE — Telephone Encounter (Signed)
I called Mr Lenker to remind him of his appointment tomorrow. He stated he would be there and did not need help with transportation. He was also told to bring all of his medications and pillbox so we could handle that when the appointment is finished. He was understanding and agreeable.   Jacqualine Code, EMT 11/13/20

## 2020-11-14 ENCOUNTER — Other Ambulatory Visit (HOSPITAL_COMMUNITY): Payer: Self-pay

## 2020-11-14 ENCOUNTER — Ambulatory Visit (HOSPITAL_COMMUNITY)
Admission: RE | Admit: 2020-11-14 | Discharge: 2020-11-14 | Disposition: A | Discharge status: Home / Self Care | Payer: Self-pay | Source: Ambulatory Visit | Attending: Pharmacist | Admitting: Pharmacist

## 2020-11-14 ENCOUNTER — Other Ambulatory Visit: Payer: Self-pay

## 2020-11-14 ENCOUNTER — Other Ambulatory Visit (HOSPITAL_COMMUNITY): Payer: Self-pay | Admitting: Internal Medicine

## 2020-11-14 VITALS — BP 138/68 | HR 91 | Wt 270.4 lb

## 2020-11-14 DIAGNOSIS — Z79899 Other long term (current) drug therapy: Secondary | ICD-10-CM | POA: Insufficient documentation

## 2020-11-14 DIAGNOSIS — I4892 Unspecified atrial flutter: Secondary | ICD-10-CM | POA: Insufficient documentation

## 2020-11-14 DIAGNOSIS — Z7901 Long term (current) use of anticoagulants: Secondary | ICD-10-CM | POA: Insufficient documentation

## 2020-11-14 DIAGNOSIS — Z87891 Personal history of nicotine dependence: Secondary | ICD-10-CM | POA: Insufficient documentation

## 2020-11-14 DIAGNOSIS — F1010 Alcohol abuse, uncomplicated: Secondary | ICD-10-CM | POA: Insufficient documentation

## 2020-11-14 DIAGNOSIS — I5022 Chronic systolic (congestive) heart failure: Secondary | ICD-10-CM | POA: Insufficient documentation

## 2020-11-14 DIAGNOSIS — I471 Supraventricular tachycardia: Secondary | ICD-10-CM | POA: Insufficient documentation

## 2020-11-14 DIAGNOSIS — I480 Paroxysmal atrial fibrillation: Secondary | ICD-10-CM | POA: Insufficient documentation

## 2020-11-14 DIAGNOSIS — I110 Hypertensive heart disease with heart failure: Secondary | ICD-10-CM | POA: Insufficient documentation

## 2020-11-14 MED ORDER — CARVEDILOL 6.25 MG PO TABS
6.2500 mg | ORAL_TABLET | Freq: Two times a day (BID) | ORAL | 11 refills | Status: DC
Start: 2020-11-14 — End: 2020-11-14

## 2020-11-14 MED FILL — CARVEDILOL 6.25 MG TABLET: 6.25 | 30 days supply | Qty: 60 | Fill #0

## 2020-11-14 NOTE — Progress Notes (Signed)
Paramedicine Encounter   Patient ID: Antonio Tapia , male,   DOB: 02/04/1981,39 y.o.,  MRN: [id number]   Mr Chavira was seen in the HF clinic today with Karle Plumber, PharmD. He reported feeling well, denying any symptoms worrisome for fluid overload. Per Leotis Shames, he is to increase coreg to 6.25 mg BID. This change was made to his pillbox and Lauren advised she would send in the new prescription. Nothing further was needed this week so I will follow up next week.   Jacqualine Code, EMT 11/14/2020

## 2020-11-14 NOTE — Patient Instructions (Signed)
It was a pleasure seeing you today!  MEDICATIONS: -We are changing your medications today -Increase carvedilol to 6.25 mg (1 tablet) twice daily. -Call if you have questions about your medications.   NEXT APPOINTMENT: Return to clinic in 1 month with [facility].  In general, to take care of your heart failure: -Limit your fluid intake to 2 Liters (half-gallon) per day.   -Limit your salt intake to ideally 2-3 grams (2000-3000 mg) per day. -Weigh yourself daily and record, and bring that "weight diary" to your next appointment.  (Weight gain of 2-3 pounds in 1 day typically means fluid weight.) -The medications for your heart are to help your heart and help you live longer.   -Please contact us before stopping any of your heart medications.  Call the clinic at [phone] with questions or to reschedule future appointments.  

## 2020-11-20 ENCOUNTER — Telehealth (HOSPITAL_COMMUNITY): Payer: Self-pay

## 2020-11-20 NOTE — Telephone Encounter (Signed)
I called Mr Shuck to schedule an appointment. He did not answer so I left a message requesting he call me back.  Jacqualine Code, EMT  11/20/20

## 2020-11-22 ENCOUNTER — Other Ambulatory Visit (HOSPITAL_COMMUNITY): Payer: Self-pay

## 2020-11-22 NOTE — Progress Notes (Signed)
Paramedicine Encounter    Patient ID: Antonio Tapia, male    DOB: 12/01/1981, 39 y.o.   MRN: [id number]   Patient Care Team: Arvilla Market, DO as PCP - General (Family Medicine)  Patient Active Problem List   Diagnosis Date Noted  . Hypoxia   . Acute respiratory failure with hypoxia (HCC)   . Acute on chronic systolic (congestive) heart failure (HCC) 09/17/2020  . CHF (congestive heart failure) (HCC) 10/27/2018  . Acute congestive heart failure (HCC)   . Typical atrial flutter (HCC)   . Essential hypertension     Current Outpatient Medications:  .  amiodarone (PACERONE) 200 MG tablet, Take 1 tablet (200 mg total) by mouth daily., Disp: 30 tablet, Rfl: 3 .  apixaban (ELIQUIS) 5 MG TABS tablet, Take 1 tablet (5 mg total) by mouth 2 (two) times daily., Disp: 60 tablet, Rfl: 6 .  carvedilol (COREG) 6.25 MG tablet, Take 1 tablet (6.25 mg total) by mouth 2 (two) times daily with a meal., Disp: 60 tablet, Rfl: 11 .  dapagliflozin propanediol (FARXIGA) 10 MG TABS tablet, Take 1 tablet (10 mg total) by mouth daily before breakfast., Disp: 30 tablet, Rfl: 3 .  hydrALAZINE (APRESOLINE) 50 MG tablet, Take 1 tablet (50 mg total) by mouth every 8 (eight) hours., Disp: 90 tablet, Rfl: 6 .  isosorbide mononitrate (IMDUR) 30 MG 24 hr tablet, Take 1 tablet (30 mg total) by mouth daily., Disp: 30 tablet, Rfl: 6 .  sacubitril-valsartan (ENTRESTO) 97-103 MG, Take 1 tablet by mouth 2 (two) times daily., Disp: 60 tablet, Rfl: 6 .  sertraline (ZOLOFT) 25 MG tablet, Take one tablet daily. If tolerating, increase to two tablets in one week., Disp: 60 tablet, Rfl: 1 .  spironolactone (ALDACTONE) 25 MG tablet, Take 1 tablet (25 mg total) by mouth daily., Disp: 30 tablet, Rfl: 6 No Known Allergies    Social History   Socioeconomic History  . Marital status: Single    Spouse name: Not on file  . Number of children: Not on file  . Years of education: Not on file  . Highest education level: Not on  file  Occupational History  . Not on file  Tobacco Use  . Smoking status: Current Every Day Smoker  . Smokeless tobacco: Never Used  Substance and Sexual Activity  . Alcohol use: Yes    Comment: 1 pint liquor/day  . Drug use: No  . Sexual activity: Not on file  Other Topics Concern  . Not on file  Social History Narrative  . Not on file   Social Determinants of Health   Financial Resource Strain: Low Risk   . Difficulty of Paying Living Expenses: Not very hard  Food Insecurity: No Food Insecurity  . Worried About Programme researcher, broadcasting/film/video in the Last Year: Never true  . Ran Out of Food in the Last Year: Never true  Transportation Needs: No Transportation Needs  . Lack of Transportation (Medical): No  . Lack of Transportation (Non-Medical): No  Physical Activity: Not on file  Stress: Not on file  Social Connections: Not on file  Intimate Partner Violence: Not on file    Physical Exam Cardiovascular:     Rate and Rhythm: Normal rate and regular rhythm.     Pulses: Normal pulses.  Pulmonary:     Effort: Pulmonary effort is normal.     Breath sounds: Normal breath sounds.  Musculoskeletal:        General: Normal range of motion.  Right lower leg: No edema.     Left lower leg: No edema.  Skin:    General: Skin is warm and dry.     Capillary Refill: Capillary refill takes less than 2 seconds.  Neurological:     Mental Status: He is alert and oriented to person, place, and time.  Psychiatric:        Mood and Affect: Mood normal.         Future Appointments  Date Time Provider Department Center  11/29/2020  3:45 PM [facility]  12/05/2020  1:30 PM Arvilla Market, DO [facility] None  12/16/2020  3:00 PM [facility] PA/NP [facility] None  01/15/2021  3:00 PM Bensimhon, Bevelyn Buckles, MD [facility] None    BP 126/81 (BP Location: Left Arm, Patient Position: Sitting, Cuff Size: Large)   Pulse 88   Resp 16   Wt 260 lb (117.9 kg)   SpO2 97%    BMI 32.70 kg/m   Weight yesterday- did not weigh  Last visit weight- 270  Antonio Tapia was seen at home today and reported feeling generally well. He denied chest pain, SOB, headache, dizziness, orthopnea, fever or cough over the past week. He stated he has been compliant with his medications and his weight has been stable. His medications were verified and his pillbox was refilled. Per Antonio Tapia's girlfriend he has been snoring at night so she obtained a pulse oximeter and reported his oxygen saturations dropping into the 60's at night while he sleeps. I reported this to the HF clinic and a sleep study is in the process of being ordered. Additionally he sttae he needs help with alcohol abuse so I will look into residential and outpatient programs for him to look into. I will follow up next week.   Jacqualine Code, EMT 11/22/20  ACTION: Home visit completed Next visit planned for 1 week

## 2020-11-29 ENCOUNTER — Other Ambulatory Visit (HOSPITAL_COMMUNITY): Payer: Self-pay

## 2020-11-29 ENCOUNTER — Ambulatory Visit: Payer: Self-pay | Attending: Internal Medicine

## 2020-11-29 DIAGNOSIS — Z23 Encounter for immunization: Secondary | ICD-10-CM

## 2020-11-29 MED FILL — ISOSORBIDE MN ER 30 MG TAB: 30 | 34 days supply | Qty: 34 | Fill #1

## 2020-11-29 MED FILL — SPIRONOLACTONE 25 MG TABS: 25 | 34 days supply | Qty: 34 | Fill #1

## 2020-11-29 NOTE — Progress Notes (Signed)
° °  Covid-19 Vaccination Clinic  Name:  Yasmin Dibello    MRN: [id number] DOB: 11-Nov-1981  11/29/2020  Mr. Sutcliffe was observed post Covid-19 immunization for 15 minutes without incident. He was provided with Vaccine Information Sheet and instruction to access the V-Safe system.   Mr. Hanway was instructed to call 911 with any severe reactions post vaccine:  Difficulty breathing   Swelling of face and throat   A fast heartbeat   A bad rash all over body   Dizziness and weakness   Immunizations Administered    Name Date Dose VIS Date Route   Pfizer COVID-19 Vaccine 11/29/2020  3:37 PM 0.3 mL 10/02/2020 Intramuscular   Manufacturer: [employer], [employer]   Lot: [id number]   NDC: [address]-5

## 2020-11-29 NOTE — Progress Notes (Signed)
Paramedicine Encounter    Patient ID: Antonio Tapia, male    DOB: 12/01/1981, 39 y.o.   MRN: [id number]   Patient Care Team: Arvilla Market, DO as PCP - General (Family Medicine)  Patient Active Problem List   Diagnosis Date Noted  . Hypoxia   . Acute respiratory failure with hypoxia (HCC)   . Acute on chronic systolic (congestive) heart failure (HCC) 09/17/2020  . CHF (congestive heart failure) (HCC) 10/27/2018  . Acute congestive heart failure (HCC)   . Typical atrial flutter (HCC)   . Essential hypertension     Current Outpatient Medications:  .  amiodarone (PACERONE) 200 MG tablet, Take 1 tablet (200 mg total) by mouth daily., Disp: 30 tablet, Rfl: 3 .  apixaban (ELIQUIS) 5 MG TABS tablet, Take 1 tablet (5 mg total) by mouth 2 (two) times daily., Disp: 60 tablet, Rfl: 6 .  carvedilol (COREG) 6.25 MG tablet, Take 1 tablet (6.25 mg total) by mouth 2 (two) times daily with a meal., Disp: 60 tablet, Rfl: 11 .  dapagliflozin propanediol (FARXIGA) 10 MG TABS tablet, Take 1 tablet (10 mg total) by mouth daily before breakfast., Disp: 30 tablet, Rfl: 3 .  hydrALAZINE (APRESOLINE) 50 MG tablet, Take 1 tablet (50 mg total) by mouth every 8 (eight) hours., Disp: 90 tablet, Rfl: 6 .  isosorbide mononitrate (IMDUR) 30 MG 24 hr tablet, Take 1 tablet (30 mg total) by mouth daily., Disp: 30 tablet, Rfl: 6 .  sacubitril-valsartan (ENTRESTO) 97-103 MG, Take 1 tablet by mouth 2 (two) times daily., Disp: 60 tablet, Rfl: 6 .  sertraline (ZOLOFT) 25 MG tablet, Take one tablet daily. If tolerating, increase to two tablets in one week., Disp: 60 tablet, Rfl: 1 .  spironolactone (ALDACTONE) 25 MG tablet, Take 1 tablet (25 mg total) by mouth daily., Disp: 30 tablet, Rfl: 6 No Known Allergies    Social History   Socioeconomic History  . Marital status: Single    Spouse name: Not on file  . Number of children: Not on file  . Years of education: Not on file  . Highest education level: Not on  file  Occupational History  . Not on file  Tobacco Use  . Smoking status: Current Every Day Smoker  . Smokeless tobacco: Never Used  Substance and Sexual Activity  . Alcohol use: Yes    Comment: 1 pint liquor/day  . Drug use: No  . Sexual activity: Not on file  Other Topics Concern  . Not on file  Social History Narrative  . Not on file   Social Determinants of Health   Financial Resource Strain: Low Risk   . Difficulty of Paying Living Expenses: Not very hard  Food Insecurity: No Food Insecurity  . Worried About Programme researcher, broadcasting/film/video in the Last Year: Never true  . Ran Out of Food in the Last Year: Never true  Transportation Needs: No Transportation Needs  . Lack of Transportation (Medical): No  . Lack of Transportation (Non-Medical): No  Physical Activity: Not on file  Stress: Not on file  Social Connections: Not on file  Intimate Partner Violence: Not on file    Physical Exam Cardiovascular:     Rate and Rhythm: Normal rate and regular rhythm.     Pulses: Normal pulses.  Pulmonary:     Effort: Pulmonary effort is normal.     Breath sounds: Normal breath sounds.  Musculoskeletal:        General: Normal range of motion.  Right lower leg: No edema.     Left lower leg: No edema.  Skin:    General: Skin is warm and dry.     Capillary Refill: Capillary refill takes less than 2 seconds.  Neurological:     Mental Status: He is alert and oriented to person, place, and time.  Psychiatric:        Mood and Affect: Mood normal.         Future Appointments  Date Time Provider Department Center  11/29/2020  3:45 PM [facility]  12/05/2020  1:30 PM Arvilla Market, DO [facility] None  12/16/2020  3:00 PM [facility] PA/NP [facility] None  01/15/2021  3:00 PM Bensimhon, Bevelyn Buckles, MD [facility] None    BP 108/67 (BP Location: Right Arm, Patient Position: Sitting, Cuff Size: Large)   Pulse 91   Resp 16   Wt 268 lb (121.6 kg)   SpO2 96%    BMI 33.71 kg/m   Weight yesterday- 267 lb Last visit weight- 271 lb  [name] was seen at home today and reported feeling well. He denied chest pain, SOB, headache, dizziness, orthopnea, fever or cough. He stated he has been compliant with his medications however he had missed several afternoon doses of hydralazine. Additionally he is still drinking heavily. We spent time on the phone today with [facility] getting information regarding outpatient treatment. He stated he would be able to go there for a walk in appointment and begin the process of enrolling in treatment for alcoholism. His medications were verified and his pillbox was refilled. I will follow up next week.    Jacqualine Code, EMT 11/29/20  ACTION: Home visit completed Next visit planned for 1 week

## 2020-12-03 ENCOUNTER — Telehealth (HOSPITAL_COMMUNITY): Payer: Self-pay | Admitting: Licensed Clinical Social Worker

## 2020-12-03 NOTE — Telephone Encounter (Signed)
CSW informed by [employer] that pt has started drinking again and is interested in outpatient rehab options.  CSW called pt to discuss.  Pt states he is currently drinking 5-6 liquor drinks a night most nights of the week.  Does not think he is experiencing detox symptoms during the day and denies the "need" to drink.    Pt is interested in rehab but is against inpatient treatment.  CSW emailed pt list of local self-pay options for outpatient rehab- pt feels comfortable calling to inquire about services on his own- CSW to check in regarding progress next week.  CSW also talked with pt about ways to start reducing at home.  Pt states him and his girlfriend purchase alcohol to bring into the home.  Pt states he feels comfortable talking to his girlfriend about them not purchasing alcohol for the house to prevent it being as accessible in the home.  Will continue to follow and assist as needed  Burna Sis, LCSW Clinical Social Worker [facility] Desk#: [phone] Cell#: [phone]

## 2020-12-05 ENCOUNTER — Encounter: Payer: Self-pay | Admitting: Internal Medicine

## 2020-12-05 ENCOUNTER — Other Ambulatory Visit: Payer: Self-pay

## 2020-12-05 ENCOUNTER — Telehealth (INDEPENDENT_AMBULATORY_CARE_PROVIDER_SITE_OTHER): Payer: Self-pay | Admitting: Internal Medicine

## 2020-12-05 DIAGNOSIS — F419 Anxiety disorder, unspecified: Secondary | ICD-10-CM

## 2020-12-05 DIAGNOSIS — F32A Depression, unspecified: Secondary | ICD-10-CM

## 2020-12-05 MED ORDER — SERTRALINE HCL 50 MG PO TABS: 50.0000 mg | ORAL_TABLET | Freq: Every day | ORAL | 1 refills | Status: DC

## 2020-12-05 NOTE — Progress Notes (Signed)
Virtual Visit via Telephone Note  I connected with Antonio Tapia, on 12/05/2020 at 1:40 PM by telephone due to the COVID-19 pandemic and verified that I am speaking with the correct person using two identifiers.   Consent: I discussed the limitations, risks, security and privacy concerns of performing an evaluation and management service by telephone and the availability of in person appointments. I also discussed with the patient that there may be a patient responsible charge related to this service. The patient expressed understanding and agreed to proceed.   Location of Patient: Home   Location of Provider: Clinic    Persons participating in Telemedicine visit: Maude Gloor [facility] Dr. Earlene Plater      History of Present Illness: Patient has a visit to follow up on anxiety and depression. Taking Zoloft 50 mg daily. Says mood has been good. No concerns at all. Would like to see Jenel Lucks, LCSW again for other therapy session.   GAD 7 : Generalized Anxiety Score 12/05/2020 10/24/2020 10/10/2018  Nervous, Anxious, on Edge 1 2 0  Control/stop worrying 1 1 0  Worry too much - different things 0 1 0  Trouble relaxing 1 2 0  Restless 0 2 0  Easily annoyed or irritable 0 0 0  Afraid - awful might happen 0 0 0  Total GAD 7 Score 3 8 0   Depression screen [facility] 2/9 12/05/2020 10/24/2020 10/15/2020  Decreased Interest 0 1 0  Down, Depressed, Hopeless 0 0 0  PHQ - 2 Score 0 1 0  Altered sleeping 0 3 -  Tired, decreased energy 1 3 -  Change in appetite 0 2 -  Feeling bad or failure about yourself  0 2 -  Trouble concentrating 0 0 -  Moving slowly or fidgety/restless 0 3 -  Suicidal thoughts 0 0 -  PHQ-9 Score 1 14 -     Past Medical History:  Diagnosis Date  . Acute congestive heart failure (HCC)   . Alcohol use   . Atrial flutter (HCC)   . Essential hypertension   . Hypertension    [employer] 10/29/2020  . Tobacco use    No Known Allergies  Current Outpatient  Medications on File Prior to Visit  Medication Sig Dispense Refill  . amiodarone (PACERONE) 200 MG tablet Take 1 tablet (200 mg total) by mouth daily. 30 tablet 3  . apixaban (ELIQUIS) 5 MG TABS tablet Take 1 tablet (5 mg total) by mouth 2 (two) times daily. 60 tablet 6  . carvedilol (COREG) 6.25 MG tablet Take 1 tablet (6.25 mg total) by mouth 2 (two) times daily with a meal. 60 tablet 11  . dapagliflozin propanediol (FARXIGA) 10 MG TABS tablet Take 1 tablet (10 mg total) by mouth daily before breakfast. 30 tablet 3  . hydrALAZINE (APRESOLINE) 50 MG tablet Take 1 tablet (50 mg total) by mouth every 8 (eight) hours. 90 tablet 6  . isosorbide mononitrate (IMDUR) 30 MG 24 hr tablet Take 1 tablet (30 mg total) by mouth daily. 30 tablet 6  . sacubitril-valsartan (ENTRESTO) 97-103 MG Take 1 tablet by mouth 2 (two) times daily. 60 tablet 6  . sertraline (ZOLOFT) 25 MG tablet Take one tablet daily. If tolerating, increase to two tablets in one week. 60 tablet 1  . spironolactone (ALDACTONE) 25 MG tablet Take 1 tablet (25 mg total) by mouth daily. 30 tablet 6   No current facility-administered medications on file prior to visit.    Observations/Objective: NAD. Speaking clearly.  Work of breathing normal.  Alert and oriented. Mood appropriate.   Assessment and Plan: 1. Anxiety and depression PHQ-9 and GAD-7 significantly improved without concerns for uncontrolled anxiety or depression. No SI. Continue Zoloft at 50 mg dose. Will arrange f/u with Jenel Lucks, LCSW. Follow up with this provider in 3 months or sooner if has concerns about mood, medication side effects, etc.  - sertraline (ZOLOFT) 50 MG tablet; Take 1 tablet (50 mg total) by mouth daily. Take one tablet daily. If tolerating, increase to two tablets in one week.  Dispense: 90 tablet; Refill: 1   Follow Up Instructions: 3 month f/u    I discussed the assessment and treatment plan with the patient. The patient was provided an  opportunity to ask questions and all were answered. The patient agreed with the plan and demonstrated an understanding of the instructions.   The patient was advised to call back or seek an in-person evaluation if the symptoms worsen or if the condition fails to improve as anticipated.     I provided 7 minutes total of non-face-to-face time during this encounter including median intraservice time, reviewing previous notes, investigations, ordering medications, medical decision making, coordinating care and patient verbalized understanding at the end of the visit.    Marcy Siren, D.O. Primary Care at [facility]  12/05/2020, 1:40 PM

## 2020-12-06 ENCOUNTER — Telehealth (HOSPITAL_COMMUNITY): Payer: Self-pay

## 2020-12-06 NOTE — Telephone Encounter (Signed)
I called [name] to see if he was in need of anything before the weekend. He did not answer so I left a message requesting he call me back.  Jacqualine Code, EMT 12/06/20

## 2020-12-12 ENCOUNTER — Telehealth (HOSPITAL_COMMUNITY): Payer: Self-pay

## 2020-12-12 ENCOUNTER — Telehealth (HOSPITAL_COMMUNITY): Payer: Self-pay | Admitting: Licensed Clinical Social Worker

## 2020-12-12 NOTE — Telephone Encounter (Signed)
CSW called pt to check on status of finding substance abuse counseling- unable to reach- left VM requesting return call  Burna Sis, LCSW Clinical Social Worker [facility] Desk#: [phone] Cell#: [phone]

## 2020-12-12 NOTE — Telephone Encounter (Signed)
I called Mr Longest to schedule and appointment. He did not answer so I left a message requesting him to call me back.   Jacqualine Code, EMT 12/12/20

## 2020-12-16 ENCOUNTER — Encounter (HOSPITAL_COMMUNITY): Payer: Self-pay

## 2020-12-17 ENCOUNTER — Telehealth (HOSPITAL_COMMUNITY): Payer: Self-pay | Admitting: Licensed Clinical Social Worker

## 2020-12-17 ENCOUNTER — Telehealth (HOSPITAL_COMMUNITY): Payer: Self-pay

## 2020-12-17 NOTE — Telephone Encounter (Signed)
I called Antonio Tapia to schedule an appointment. He did not answer so I left a message requesting he call me back.   Jacqualine Code, EMT 12/17/20

## 2020-12-17 NOTE — Telephone Encounter (Signed)
CSW called pt to check in- unable to reach- left VM and sent text message requesting return call  Burna Sis, LCSW Clinical Social Worker [facility] Desk#: [phone] Cell#: [phone]

## 2020-12-18 ENCOUNTER — Telehealth (HOSPITAL_COMMUNITY): Payer: Self-pay

## 2020-12-18 NOTE — Telephone Encounter (Signed)
I received a call from Antonio Tapia regarding an appointment. He stated he was sorry for not getting back with me sooner and would be available any time this week. We agreed to meet tomorrow at 12:00.  Jacqualine Code, EMT  12/18/20

## 2020-12-19 ENCOUNTER — Other Ambulatory Visit (HOSPITAL_COMMUNITY): Payer: Self-pay

## 2020-12-19 MED FILL — AMIODARONE HCL 200 MG TAB: 200 | 34 days supply | Qty: 34 | Fill #1

## 2020-12-19 MED FILL — CARVEDILOL 6.25 MG TABLET: 6.25 | 30 days supply | Qty: 60 | Fill #1

## 2020-12-19 MED FILL — FARXIGA 10 MG TABLET: 10 | 34 days supply | Qty: 34 | Fill #1

## 2020-12-19 MED FILL — hydrALAZINE HCL 50 MG TABS: 50 | 34 days supply | Qty: 102 | Fill #1

## 2020-12-19 NOTE — Progress Notes (Signed)
Paramedicine Encounter    Patient ID: Antonio Tapia, male    DOB: December 12, 1981, 40 y.o.   MRN: [id number]   Patient Care Team: Arvilla Market, DO as PCP - General (Family Medicine)  Patient Active Problem List   Diagnosis Date Noted  . Hypoxia   . Acute respiratory failure with hypoxia (HCC)   . Acute on chronic systolic (congestive) heart failure (HCC) 09/17/2020  . CHF (congestive heart failure) (HCC) 10/27/2018  . Acute congestive heart failure (HCC)   . Typical atrial flutter (HCC)   . Essential hypertension     Current Outpatient Medications:  .  amiodarone (PACERONE) 200 MG tablet, Take 1 tablet (200 mg total) by mouth daily., Disp: 30 tablet, Rfl: 3 .  apixaban (ELIQUIS) 5 MG TABS tablet, Take 1 tablet (5 mg total) by mouth 2 (two) times daily., Disp: 60 tablet, Rfl: 6 .  carvedilol (COREG) 6.25 MG tablet, Take 1 tablet (6.25 mg total) by mouth 2 (two) times daily with a meal., Disp: 60 tablet, Rfl: 11 .  dapagliflozin propanediol (FARXIGA) 10 MG TABS tablet, Take 1 tablet (10 mg total) by mouth daily before breakfast., Disp: 30 tablet, Rfl: 3 .  hydrALAZINE (APRESOLINE) 50 MG tablet, Take 1 tablet (50 mg total) by mouth every 8 (eight) hours., Disp: 90 tablet, Rfl: 6 .  isosorbide mononitrate (IMDUR) 30 MG 24 hr tablet, Take 1 tablet (30 mg total) by mouth daily., Disp: 30 tablet, Rfl: 6 .  sacubitril-valsartan (ENTRESTO) 97-103 MG, Take 1 tablet by mouth 2 (two) times daily., Disp: 60 tablet, Rfl: 6 .  sertraline (ZOLOFT) 50 MG tablet, Take 1 tablet (50 mg total) by mouth daily. Take one tablet daily. If tolerating, increase to two tablets in one week., Disp: 90 tablet, Rfl: 1 .  spironolactone (ALDACTONE) 25 MG tablet, Take 1 tablet (25 mg total) by mouth daily., Disp: 30 tablet, Rfl: 6 No Known Allergies    Social History   Socioeconomic History  . Marital status: Single    Spouse name: Not on file  . Number of children: Not on file  . Years of education: Not  on file  . Highest education level: Not on file  Occupational History  . Not on file  Tobacco Use  . Smoking status: Current Every Day Smoker  . Smokeless tobacco: Never Used  Substance and Sexual Activity  . Alcohol use: Yes    Comment: 1 pint liquor/day  . Drug use: No  . Sexual activity: Not on file  Other Topics Concern  . Not on file  Social History Narrative  . Not on file   Social Determinants of Health   Financial Resource Strain: Low Risk   . Difficulty of Paying Living Expenses: Not very hard  Food Insecurity: No Food Insecurity  . Worried About Programme researcher, broadcasting/film/video in the Last Year: Never true  . Ran Out of Food in the Last Year: Never true  Transportation Needs: No Transportation Needs  . Lack of Transportation (Medical): No  . Lack of Transportation (Non-Medical): No  Physical Activity: Not on file  Stress: Not on file  Social Connections: Not on file  Intimate Partner Violence: Not on file    Physical Exam Cardiovascular:     Rate and Rhythm: Normal rate and regular rhythm.     Pulses: Normal pulses.  Pulmonary:     Effort: Pulmonary effort is normal.     Breath sounds: Normal breath sounds.  Abdominal:     General:  Abdomen is flat.  Musculoskeletal:        General: Normal range of motion.     Right lower leg: No edema.     Left lower leg: No edema.  Skin:    General: Skin is warm and dry.     Capillary Refill: Capillary refill takes less than 2 seconds.  Neurological:     Mental Status: He is alert and oriented to person, place, and time.  Psychiatric:        Mood and Affect: Mood normal.         Future Appointments  Date Time Provider Department Center  01/02/2021  4:00 PM Jenel Lucks D, LCSW [facility] None  01/15/2021  3:00 PM Bensimhon, Bevelyn Buckles, MD [facility] None    BP 133/86 (BP Location: Left Arm, Patient Position: Sitting, Cuff Size: Large)   Pulse 91   Resp 16   Wt 267 lb (121.1 kg)   SpO2 98%   BMI 33.58 kg/m   Weight  yesterday- 267 lb Last visit weight- 268 lb  Mr Atkerson was seen at home today and reported feeling well. He denied chest pain, SOB, headache, dizziness, orthopnea, fever or cough since our last visit. He advised he has not had a drink in 4 days but does not have a plan for staying sober. I provided him with a list of substance abuse centers and asked that he call before we meet next week. We spoke about his living situation and he advised that his girlfriend is drinking a lot and agreed that their shared home is not the best place for him to achieve sobriety be he remains reluctant to try in-patient rehab.   He stated he has been compliant with his medications since our last visit but a pill count of his medications proved that to be false. He has missed countless days of medications over the holiday season but was unable to give an answer as to why. I stressed the importance of staying in touch with me and taking his medications every day and he expressed understanding.   His medications were verified and his pillbox was refilled. I will follow up next week.   Jacqualine Code, EMT 12/19/20  ACTION: Home visit completed Next visit planned for 1 week

## 2021-01-02 ENCOUNTER — Other Ambulatory Visit: Payer: Self-pay

## 2021-01-02 ENCOUNTER — Ambulatory Visit (INDEPENDENT_AMBULATORY_CARE_PROVIDER_SITE_OTHER): Payer: Self-pay | Admitting: Licensed Clinical Social Worker

## 2021-01-02 ENCOUNTER — Telehealth (HOSPITAL_COMMUNITY): Payer: Self-pay

## 2021-01-02 DIAGNOSIS — F411 Generalized anxiety disorder: Secondary | ICD-10-CM

## 2021-01-02 NOTE — Telephone Encounter (Signed)
Spoke to Antonio Tapia who reports he is doing good and has all his medications and reports his weight is 267lbs today. Shin states he took his medications today. I asked if I could come out for home visit and he reports he was exposed to covid and is wanting to get tested. I assisted him in letting him know where to get tested and he agreed. He reports he lives with his girlfriend who has had sinus congestion cough with loss of taste and smell. I gave them advise for her for OTC medications and vitamins to assist. I will call Erby early next week to plan home visit for [place]. Garyn agreed. Call complete.

## 2021-01-02 NOTE — Telephone Encounter (Signed)
I called Mr Muhl to see how he has been and to try to get him scheduled for an appointment with either Florentina Addison or Herbert Seta. He did not answer so I left a message requesting he call me back.  Jacqualine Code, EMT 01/02/21

## 2021-01-08 ENCOUNTER — Telehealth (HOSPITAL_COMMUNITY): Payer: Self-pay

## 2021-01-08 NOTE — Telephone Encounter (Signed)
Spoke to Antonio Tapia who reports he is feeling better but not 100% with upper respiratory and congestion type symptoms. I offered to come out for home visit and he reports he doesn't want me to at this time. I reviewed medications one by one with him over the phone. He denied any increased swelling, shortness of breath or dizziness. He needs refills for hydralazine, I will call in same today. I told him I would reach out again next week. Antonio Tapia was agreeable.  Call complete.

## 2021-01-10 NOTE — BH Specialist Note (Signed)
Integrated Behavioral Health via Telemedicine Visit  01/02/21 Antonio Tapia [id number]  Number of Integrated Behavioral Health visits: 2 Session Start time: 4:10 PM  Session End time: 4:35 PM Total time: 25  Referring Provider: Dr. Earlene Plater Patient/Family location: Home [facility] Provider location: Office All persons participating in visit: LCSW Types of Service: Individual psychotherapy  I connected with Antonio Tapia by Telephone  (Video is Caregility application) and verified that I am speaking with the correct person using two identifiers.Discussed confidentiality: Yes   I discussed the limitations of telemedicine and the availability of in person appointments.  Discussed there is a possibility of technology failure and discussed alternative modes of communication if that failure occurs.  I discussed that engaging in this telemedicine visit, they consent to the provision of behavioral healthcare and the services will be billed under their insurance.  Patient and/or legal guardian expressed understanding and consented to Telemedicine visit: Yes   Presenting Concerns: Patient and/or family reports the following symptoms/concerns: Pt reports stress triggered by difficulty managing medical conditions. Pt reports that he has significantly decreased alcohol use from three to one "stiff drinks" daily. He has quit smoking and caffeine Duration of problem: Oct 2021; Severity of problem: moderate  Patient and/or Family's Strengths/Protective Factors: Social connections, Social and Emotional competence, Concrete supports in place (healthy food, safe environments, etc.) and Sense of purpose Pt receives strong support from family and Paramedicine through [employer]  Goals Addressed: Patient will: 1.  Reduce symptoms of: anxiety Pt agreed to continue medication management 2.  Demonstrate ability to: Increase adequate support systems for patient/family Pt agreed to follow up with Dr. Gala Romney regarding  progress in health management and interest in returning to work  Progress towards Goals: Ongoing  Interventions: Interventions utilized:  Solution-Focused Strategies and Supportive Counseling Standardized Assessments completed: Not Needed  Patient Response: Pt was engaged during session and was successful in identifying strategies to assist with management of symptoms  Assessment: Patient currently experiencing anxiety symptoms triggered by chronic health conditions. Pt reports improvement in energy and feeling more productive since the start of Zoloft approximately 8 weeks ago.  Patient may benefit from continued medication management and participation in supportive resources through [place].  Plan: 1. Follow up with behavioral health clinician on : LCSW encouraged pt to contact her with any additional behavioral health and/or resource needs 2. Behavioral recommendations: Utilize strategies discussed, continue with medication compliance, and follow up with upcoming appointments 3. Referral(s): Integrated [employer] (In Clinic)  I discussed the assessment and treatment plan with the patient and/or parent/guardian. They were provided an opportunity to ask questions and all were answered. They agreed with the plan and demonstrated an understanding of the instructions.   They were advised to call back or seek an in-person evaluation if the symptoms worsen or if the condition fails to improve as anticipated.  Bridgett Larsson, LCSW  01/10/2021 7:56 AM

## 2021-01-15 ENCOUNTER — Other Ambulatory Visit: Payer: Self-pay

## 2021-01-15 ENCOUNTER — Ambulatory Visit (HOSPITAL_COMMUNITY)
Admission: RE | Admit: 2021-01-15 | Discharge: 2021-01-15 | Disposition: A | Discharge status: Home / Self Care | Payer: Self-pay | Source: Ambulatory Visit | Attending: Internal Medicine | Admitting: Internal Medicine

## 2021-01-15 DIAGNOSIS — I5022 Chronic systolic (congestive) heart failure: Secondary | ICD-10-CM

## 2021-01-15 DIAGNOSIS — I110 Hypertensive heart disease with heart failure: Secondary | ICD-10-CM

## 2021-01-15 DIAGNOSIS — Z87891 Personal history of nicotine dependence: Secondary | ICD-10-CM

## 2021-01-15 DIAGNOSIS — F1010 Alcohol abuse, uncomplicated: Secondary | ICD-10-CM

## 2021-01-15 DIAGNOSIS — I471 Supraventricular tachycardia: Secondary | ICD-10-CM

## 2021-01-15 DIAGNOSIS — I10 Essential (primary) hypertension: Secondary | ICD-10-CM

## 2021-01-15 DIAGNOSIS — F17200 Nicotine dependence, unspecified, uncomplicated: Secondary | ICD-10-CM

## 2021-01-15 NOTE — Progress Notes (Signed)
Heart Failure TeleHealth Note  Due to national recommendations of social distancing due to COVID 19, Audio/video telehealth visit is felt to be most appropriate for this patient at this time.  See MyChart message from today for patient consent regarding telehealth for [facility]. The patient was identified personally using two identifiers.   Date:  01/15/2021   ID:  Antonio Tapia, DOB 07/26/1981, MRN [id number]  Location: Home  Provider location: [place] Type of Visit: Established patient  PCP:  Arvilla Market, DO  Cardiologist:  No primary care provider on file. Primary HF: [place]  Chief Complaint: Heart Failure follow-up   History of Present Illness:  Antonio Tapia is a 40 year old with history of ETOH abuse, A flutter, tobacco abuse, and chronic systolic heart failure.   Admitted from the HF clinic 09/17/20 with A/C decompensated heart failure and marked volume overload. On the day of admit he continued to decompensate withSVT. Underwent Emergent DC-CV and intubation. Placed on Norepi + milrinone+ amio drip.CCM consulted for acute respiratory failure. Hospital course complicated by septic shock from aspiration PNA and AKI. Gradually improved and RHC/LHC on 09/30/20 with normal coronaries, EF 40-45% and well compensated filling pressures.D/c wt was 261 lb.   He presents via Web designer for a telehealth visit today.  Walking 20 mins on most days. Feels much better. Gets SOB if he really pushes it. Otherwise fine. No edema, orthopnea or PND. Compliant with meds. Has cut ETOH intake way down. No tobacco since 09/14/20  Antonio Tapia denies symptoms worrisome for COVID 19.   Cardiac Studies - Echo 2019: EF 30-25%, RV normal - Echo 10/21: EF 20%, RV moderately reduced  - [facility] 10/21 Findings:  Ao = 123/86 (104) LV = 126/8 RA = 2 RV = 33/8 PA = 32/5 (21) PCW = 4 Fick cardiac output/index = 8.2/3.3 PVR = 2.6 WU FA  sat = 99% PA sat = 76%, 77%  Assessment: 1. 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 Well-compensated filling pressures     Past Medical History:  Diagnosis Date  . Acute congestive heart failure (HCC)   . Alcohol use   . Atrial flutter (HCC)   . Essential hypertension   . Hypertension    Phreesia 10/29/2020  . Tobacco use    Past Surgical History:  Procedure Laterality Date  . RIGHT/LEFT HEART CATH AND CORONARY ANGIOGRAPHY N/A 09/30/2020   Procedure: RIGHT/LEFT HEART CATH AND CORONARY ANGIOGRAPHY;  Surgeon: Dolores Patty, MD;  Location: [facility] INVASIVE CV LAB;  Service: Cardiovascular;  Laterality: N/A;     Current Outpatient Medications  Medication Sig Dispense Refill  . amiodarone (PACERONE) 200 MG tablet Take 1 tablet (200 mg total) by mouth daily. 30 tablet 3  . apixaban (ELIQUIS) 5 MG TABS tablet Take 1 tablet (5 mg total) by mouth 2 (two) times daily. 60 tablet 6  . carvedilol (COREG) 6.25 MG tablet Take 1 tablet (6.25 mg total) by mouth 2 (two) times daily with a meal. 60 tablet 11  . dapagliflozin propanediol (FARXIGA) 10 MG TABS tablet Take 1 tablet (10 mg total) by mouth daily before breakfast. 30 tablet 3  . hydrALAZINE (APRESOLINE) 50 MG tablet Take 1 tablet (50 mg total) by mouth every 8 (eight) hours. 90 tablet 6  . isosorbide mononitrate (IMDUR) 30 MG 24 hr tablet Take 1 tablet (30 mg total) by mouth daily. 30 tablet 6  . sacubitril-valsartan (ENTRESTO) 97-103 MG Take 1 tablet  by mouth 2 (two) times daily. 60 tablet 6  . sertraline (ZOLOFT) 50 MG tablet Take 1 tablet (50 mg total) by mouth daily. Take one tablet daily. If tolerating, increase to two tablets in one week. 90 tablet 1  . spironolactone (ALDACTONE) 25 MG tablet Take 1 tablet (25 mg total) by mouth daily. 30 tablet 6   No current facility-administered medications for this encounter.    Allergies:   Patient has no known allergies.   Social History:  The patient  reports that he has been  smoking. He has never used smokeless tobacco. He reports current alcohol use. He reports that he does not use drugs.   Family History:  The patient's family history includes Breast cancer in his paternal grandfather; Diabetes in his paternal grandmother; Hypertension in his father and mother.   ROS:  Please see the history of present illness.   All other systems are personally reviewed and negative.   Exam:  (Video/Tele Health Call; Exam is subjective and or/visual.) General:  Speaks in full sentences. No resp difficulty. Lungs: Normal respiratory effort with conversation.  Abdomen: Non-distended per patient report Extremities: Pt denies edema. Neuro: Alert & oriented x 3.   Recent Labs: 09/17/2020: B Natriuretic Peptide 1,001.0; TSH 4.471 09/24/2020: ALT 21 09/30/2020: Magnesium 2.4 10/09/2020: Hemoglobin 14.2; Platelets 361 10/16/2020: BUN 14; Creatinine, Ser 1.04; Potassium 4.0; Sodium 138  Personally reviewed   Wt Readings from Last 3 Encounters:  12/19/20 121.1 kg (267 lb)  11/29/20 121.6 kg (268 lb)  11/22/20 123.1 kg (271 lb 6.4 oz)      ASSESSMENT AND PLAN:  1. 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  - Admit for a/c decompensated HF 10/21 c/b SVT leading to cardiogenic shock  - Echo 10/21: EF 20%, RV moderately reduced  - R/LHC showed normal coronaries, EF 40-45%, and compensated filling pressures.Fick cardiac output/index = 8.2/3.3. D/c Wt 261 lb - doing well post discharge.  - Stable NYHA II. Volume status ok. Weight today 269 (fully clothed) - Continue Entresto 97-103 mg bid - Continue Spiro 25 mg daily  - Continue Coreg 3.125 mg bid - Continue Imdur 30 mg daily  - Continue hydralazine 50 mg tid - Continue Farxiga 10 mg daily.  - See back in 1 month with echo   2. SVT - recent admit 10/21 w/ hemodynamically unstable SVT requiring  emergent DCCV  - continue ? blocker - he has cut back ETOH significantly.   3. PAF - Has not recurred - on Eliquis for stroke prophylaxis. Denies abnormal bleeding.  - will ultimately need sleep study once he has insurance (h/o snoring and HTN)  4. HTN:  - Blood pressure well controlled. Continue current regimen.  5. ETOH Abuse - he is drinking mild amounts again. Strongly suggested complet sobriety - congratulated on efforts   6. Tobacco Abuse  - he has quit smoking  - congratulated on efforts   COVID screen The patient does not have any symptoms that suggest any further testing/ screening at this time.  Social distancing reinforced today.  Recommended follow-up:  As above  Relevant cardiac medications were reviewed at length with the patient today.   The patient does not have concerns regarding their medications at this time.   The following changes were made today:  As above  Today, I have spent 15 minutes with the patient with telehealth technology discussing  the above issues .    Signed, Arvilla Meres, MD  01/15/2021 3:09 PM  [facility] [address] [facility] [place] [address] [phone] (office) [phone] (fax)

## 2021-01-15 NOTE — Patient Instructions (Signed)
Your physician recommends that you schedule a follow-up appointment in: 1 month with an echocardiogram  Your physician has requested that you have an echocardiogram. Echocardiography is a painless test that uses sound waves to create images of your heart. It provides your doctor with information about the size and shape of your heart and how well your heart's chambers and valves are working. This procedure takes approximately one hour. There are no restrictions for this procedure.  Our office will be in touch about scheduling this appointment  If you have any questions or concerns before your next appointment please send [place] a message through [place] or call our office at [phone].    TO LEAVE A MESSAGE FOR THE NURSE SELECT OPTION 2, PLEASE LEAVE A MESSAGE INCLUDING: . YOUR NAME . DATE OF BIRTH . CALL BACK NUMBER . REASON FOR CALL**this is important as we prioritize the call backs  YOU WILL RECEIVE A CALL BACK THE SAME DAY AS LONG AS YOU CALL BEFORE 4:00 PM

## 2021-01-15 NOTE — Addendum Note (Signed)
Encounter addended by: Samara Snide, RN on: 01/15/2021 3:22 PM  Actions taken: Order list changed, Diagnosis association updated, Clinical Note Signed

## 2021-01-20 ENCOUNTER — Telehealth (HOSPITAL_COMMUNITY): Payer: Self-pay | Admitting: Licensed Clinical Social Worker

## 2021-01-20 NOTE — Telephone Encounter (Signed)
CSW received call from pt inquiring about disability application status.  States he applied a few months ago and has received paperwork in the mail from them but hasn't heard anything since.  CSW stated he needed to call assigned SSA case worker which pt confirmed is on his paperwork to inquire regarding status.  CSW then asked how patient was doing with his alcohol use- admits to still drinking though has been trying to cut back.  Has never followed up with counselors we informed pt about but is now agreeable to looking into rehab options including inpatient.  Without insurance the local option is [facility]- CSW provided pt the number and encouraged him to call them right now to inquire about possible admission.  CSW will follow up with pt tomorrow to see if we was able to get admission date or if they needed information from [place] before moving forward.  CSW will continue to follow and assist as needed  Burna Sis, LCSW Clinical Social Worker [facility] Desk#: [phone] Cell#: [phone]

## 2021-01-21 ENCOUNTER — Telehealth (HOSPITAL_COMMUNITY): Payer: Self-pay

## 2021-01-21 ENCOUNTER — Telehealth (HOSPITAL_COMMUNITY): Payer: Self-pay | Admitting: Licensed Clinical Social Worker

## 2021-01-21 NOTE — Telephone Encounter (Signed)
I called Antonio Tapia to see if he had called [facility] for inpatient services for alcohol abuse. He did not answer so I left a message requesting he call me back at his earliest convenience.   Jacqualine Code, EMT 01/21/21

## 2021-01-21 NOTE — Telephone Encounter (Signed)
CSW attempted to call pt to follow up regarding inpatient alcohol rehab.  No answer- sent text message requesting return call  Will continue to follow and assist as needed  Burna Sis, LCSW Clinical Social Worker [facility] Desk#: [phone] Cell#: [phone]

## 2021-01-27 ENCOUNTER — Telehealth (HOSPITAL_COMMUNITY): Payer: Self-pay

## 2021-01-27 NOTE — Telephone Encounter (Signed)
I called [name] to see if he had followed through on contacting [facility]. He did not answer the phone and when I attempted to leave a message his line hung up. I will try to reach out again later this week.   Jacqualine Code, EMT 01/27/21

## 2021-01-30 ENCOUNTER — Telehealth (HOSPITAL_COMMUNITY): Payer: Self-pay | Admitting: Licensed Clinical Social Worker

## 2021-01-30 NOTE — Telephone Encounter (Addendum)
CSW attempted to call patient to check in- unable to reach or leave VM.  CSW called pts mom who is listed as emergency contact and she confirms she spoke to him a couple of days ago- she let him know we were trying to reach him and he called CSW back soon after.  Pt did not follow up with [facility] and states right now he isn't interested in going to rehab as he is trying to prioritize getting a job.  CSW explained that paramedic had also been trying to get a hold of him.  Pt confirms he still wants to be seen by paramedic and CSW explained that if he is not returning our calls in the future we would have to discharge him from the program- pt expresses understanding.  Will continue to follow and assist as needed  Burna Sis, LCSW Clinical Social Worker [facility] Desk#: [phone] Cell#: [phone]

## 2021-02-12 ENCOUNTER — Telehealth (HOSPITAL_COMMUNITY): Payer: Self-pay | Admitting: Licensed Clinical Social Worker

## 2021-02-12 MED FILL — CARVEDILOL 6.25 MG TABLET: 6.25 | 30 days supply | Qty: 60 | Fill #2

## 2021-02-12 MED FILL — FARXIGA 10 MG TABLET: 10 | 34 days supply | Qty: 34 | Fill #2

## 2021-02-12 MED FILL — ISOSORBIDE MN ER 30 MG TAB: 30 | 34 days supply | Qty: 34 | Fill #2

## 2021-02-12 MED FILL — hydrALAZINE HCL 50 MG TABS: 50 | 34 days supply | Qty: 102 | Fill #2

## 2021-02-12 MED FILL — AMIODARONE HCL 200 MG TAB: 200 | 34 days supply | Qty: 34 | Fill #2

## 2021-02-12 MED FILL — SPIRONOLACTONE 25 MG TABS: 25 | 34 days supply | Qty: 34 | Fill #2

## 2021-02-12 NOTE — Telephone Encounter (Signed)
Pt being discharged from [employer] due to failure to communicate.  Pt was warned on 2/17 that he would be discharged from paramedicine if he failed to communicate and Paramedic has made several attempts since that time.  Will continue to follow through clinic and assist as needed  Burna Sis, LCSW Clinical Social Worker [facility] Desk#: [phone] Cell#: [phone]

## 2021-03-17 ENCOUNTER — Other Ambulatory Visit (HOSPITAL_COMMUNITY): Payer: Self-pay | Admitting: Internal Medicine

## 2021-03-17 ENCOUNTER — Other Ambulatory Visit (HOSPITAL_COMMUNITY): Payer: Self-pay

## 2021-03-17 MED FILL — Carvedilol Tab 6.25 MG: ORAL | 30 days supply | Qty: 60 | Fill #0 | Status: AC

## 2021-03-19 ENCOUNTER — Other Ambulatory Visit (HOSPITAL_COMMUNITY): Payer: Self-pay

## 2021-03-19 MED ORDER — ISOSORBIDE MONONITRATE ER 30 MG PO TB24
30.0000 mg | ORAL_TABLET | Freq: Every day | ORAL | 2 refills | Status: DC
Start: 2021-03-19 — End: 2021-09-04
  Filled 2021-03-19: qty 34, 34d supply, fill #0
  Filled 2021-04-29: qty 34, 34d supply, fill #1
  Filled 2021-07-10: qty 34, 34d supply, fill #2

## 2021-03-19 MED ORDER — DAPAGLIFLOZIN PROPANEDIOL 10 MG PO TABS
10.0000 mg | ORAL_TABLET | Freq: Every day | ORAL | 2 refills | Status: DC
Start: 2021-03-19 — End: 2021-09-04
  Filled 2021-03-19: qty 34, 34d supply, fill #0
  Filled 2021-04-29: qty 34, 34d supply, fill #1
  Filled 2021-07-10: qty 34, 34d supply, fill #2

## 2021-03-19 MED ORDER — SPIRONOLACTONE 25 MG PO TABS
25.0000 mg | ORAL_TABLET | Freq: Every day | ORAL | 2 refills | Status: DC
Start: 2021-03-19 — End: 2021-09-04
  Filled 2021-03-19: qty 34, 34d supply, fill #0
  Filled 2021-04-29: qty 34, 34d supply, fill #1

## 2021-03-19 MED ORDER — AMIODARONE HCL 200 MG PO TABS
200.0000 mg | ORAL_TABLET | Freq: Every day | ORAL | 2 refills | Status: DC
Start: 2021-03-19 — End: 2021-03-27
  Filled 2021-03-19: qty 34, 34d supply, fill #0

## 2021-03-19 MED ORDER — HYDRALAZINE HCL 50 MG PO TABS
50.0000 mg | ORAL_TABLET | Freq: Three times a day (TID) | ORAL | 2 refills | Status: DC
Start: 2021-03-19 — End: 2021-09-04
  Filled 2021-03-19: qty 102, 34d supply, fill #0
  Filled 2021-04-29: qty 102, 34d supply, fill #1
  Filled 2021-07-10: qty 102, 34d supply, fill #2

## 2021-03-20 ENCOUNTER — Other Ambulatory Visit (HOSPITAL_COMMUNITY): Payer: Self-pay

## 2021-03-27 ENCOUNTER — Ambulatory Visit (HOSPITAL_COMMUNITY)
Admission: RE | Admit: 2021-03-27 | Discharge: 2021-03-27 | Disposition: A | Discharge status: Home / Self Care | Payer: Self-pay | Source: Ambulatory Visit | Attending: Internal Medicine | Admitting: Internal Medicine

## 2021-03-27 ENCOUNTER — Encounter (HOSPITAL_COMMUNITY): Payer: Self-pay | Admitting: Internal Medicine

## 2021-03-27 ENCOUNTER — Ambulatory Visit (HOSPITAL_BASED_OUTPATIENT_CLINIC_OR_DEPARTMENT_OTHER)
Admission: RE | Admit: 2021-03-27 | Discharge: 2021-03-27 | Disposition: A | Discharge status: Home / Self Care | Payer: Self-pay | Source: Ambulatory Visit | Attending: Internal Medicine | Admitting: Internal Medicine

## 2021-03-27 ENCOUNTER — Other Ambulatory Visit: Payer: Self-pay

## 2021-03-27 VITALS — BP 128/80 | HR 77 | Wt 283.0 lb

## 2021-03-27 DIAGNOSIS — I483 Typical atrial flutter: Secondary | ICD-10-CM

## 2021-03-27 DIAGNOSIS — Z87891 Personal history of nicotine dependence: Secondary | ICD-10-CM | POA: Insufficient documentation

## 2021-03-27 DIAGNOSIS — I110 Hypertensive heart disease with heart failure: Secondary | ICD-10-CM | POA: Insufficient documentation

## 2021-03-27 DIAGNOSIS — I5022 Chronic systolic (congestive) heart failure: Secondary | ICD-10-CM | POA: Insufficient documentation

## 2021-03-27 DIAGNOSIS — I480 Paroxysmal atrial fibrillation: Secondary | ICD-10-CM

## 2021-03-27 DIAGNOSIS — F1010 Alcohol abuse, uncomplicated: Secondary | ICD-10-CM

## 2021-03-27 DIAGNOSIS — F17200 Nicotine dependence, unspecified, uncomplicated: Secondary | ICD-10-CM

## 2021-03-27 DIAGNOSIS — I10 Essential (primary) hypertension: Secondary | ICD-10-CM

## 2021-03-27 LAB — ECHOCARDIOGRAM COMPLETE
AR max vel: 3.01 cm2
AV Area VTI: 3.06 cm2
AV Area mean vel: 3.15 cm2
AV Mean grad: 4 mmHg
AV Peak grad: 7.5 mmHg
Ao pk vel: 1.37 m/s
Area-P 1/2: 2.68 cm2
MV VTI: 3.51 cm2
S' Lateral: 2.9 cm

## 2021-03-27 LAB — COMPREHENSIVE METABOLIC PANEL
ALT: 19 U/L (ref 0–44)
AST: 21 U/L (ref 15–41)
Albumin: 4.2 g/dL (ref 3.5–5.0)
Alkaline Phosphatase: 58 U/L (ref 38–126)
Anion gap: 9 (ref 5–15)
BUN: 16 mg/dL (ref 6–20)
CO2: 26 mmol/L (ref 22–32)
Calcium: 9.5 mg/dL (ref 8.9–10.3)
Chloride: 103 mmol/L (ref 98–111)
Creatinine, Ser: 1.22 mg/dL (ref 0.61–1.24)
GFR, Estimated: >60 mL/min (ref >60)
Glucose, Bld: 86 mg/dL (ref 70–99)
Potassium: 4.2 mmol/L (ref 3.5–5.1)
Sodium: 138 mmol/L (ref 135–145)
Total Bilirubin: 0.6 mg/dL (ref 0.3–1.2)
Total Protein: 8.1 g/dL (ref 6.5–8.1)

## 2021-03-27 LAB — CBC
HCT: 45.3 % (ref 39.0–52.0)
Hemoglobin: 14.9 g/dL (ref 13.0–17.0)
MCH: 33.9 pg (ref 26.0–34.0)
MCHC: 32.9 g/dL (ref 30.0–36.0)
MCV: 103.2 fL — ABNORMAL HIGH (ref 80.0–100.0)
Platelets: 237 10*3/uL (ref 150–400)
RBC: 4.39 MIL/uL (ref 4.22–5.81)
RDW: 11.9 % (ref 11.5–15.5)
WBC: 3.6 10*3/uL — ABNORMAL LOW (ref 4.0–10.5)
nRBC: 0 % (ref 0.0–0.2)

## 2021-03-27 LAB — T4, FREE: Free T4: 0.88 ng/dL (ref 0.61–1.12)

## 2021-03-27 LAB — TSH: TSH: 2.445 u[IU]/mL (ref 0.350–4.500)

## 2021-03-27 MED ORDER — AMIODARONE HCL 200 MG PO TABS
100.0000 mg | ORAL_TABLET | Freq: Every day | ORAL | 3 refills | Status: DC
Start: 2021-03-27 — End: 2021-09-04

## 2021-03-27 NOTE — Patient Instructions (Signed)
Decrease Amiodarone to 100 mg (1/2 tab) Daily  Labs done today, we will call you for abnormal results  Your physician recommends that you schedule a follow-up appointment in: 4 months  If you have any questions or concerns before your next appointment please send [place] a message through [place] or call our office at [phone].    TO LEAVE A MESSAGE FOR THE NURSE SELECT OPTION 2, PLEASE LEAVE A MESSAGE INCLUDING: . YOUR NAME . DATE OF BIRTH . CALL BACK NUMBER . REASON FOR CALL**this is important as we prioritize the call backs  YOU WILL RECEIVE A CALL BACK THE SAME DAY AS LONG AS YOU CALL BEFORE 4:00 PM  At the [facility], you and your health needs are our priority. As part of our continuing mission to provide you with exceptional heart care, we have created designated Provider Care Teams. These Care Teams include your primary Cardiologist (physician) and Advanced Practice Providers (APPs- Physician Assistants and Nurse Practitioners) who all work together to provide you with the care you need, when you need it.   You may see any of the following providers on your designated Care Team at your next follow up: Marland Kitchen Dr Arvilla Meres . Dr Marca Ancona . Dr Thornell Mule . Tonye Becket, NP . Robbie Lis, PA . Shanda Bumps [place],NP . Karle Plumber, PharmD   Please be sure to bring in all your medications bottles to every appointment.

## 2021-03-27 NOTE — Progress Notes (Incomplete)
  Echocardiogram 2D Echocardiogram has been performed.  Antonio Tapia 03/27/2021, 1:45 PM

## 2021-03-27 NOTE — Progress Notes (Signed)
Advanced Heart Failure Clinic Note    Date:  03/27/2021   ID:  Domingo Dimes, DOB 1981-10-31, MRN [id number]  Location: Home  Provider location: [place] [facility] Type of Visit: Established patient  PCP:  Arvilla Market, DO  Cardiologist:  No primary care provider on file. Primary HF: Roxi Hlavaty  Chief Complaint: Heart Failure follow-up   History of Present Illness:  Mr Dilauro is a 40 year old with history of ETOH abuse, A flutter, tobacco abuse, and chronic systolic heart failure.   Admitted from the HF clinic 09/17/20 with A/C decompensated heart failure and marked volume overload. On the day of admit he continued to decompensate withSVT. Underwent Emergent DC-CV and intubation. Placed on Norepi + milrinone+ amio drip.CCM consulted for acute respiratory failure. Hospital course complicated by septic shock from aspiration PNA and AKI. Gradually improved and RHC/LHC on 09/30/20 with normal coronaries, EF 40-45% and well compensated filling pressures.D/c wt was 261 lb.   Here for f/u with his girlfriend. Feels great. No SOB, orthopnea or PND. Complaint with meds. Quit smoking 10/21. Hasn't had ETOH for a month. No bleeding on Eliquis. GF says he snores badly.   Echo today 03/27/21 EF 60-65% mod LVH G2DD Personally reviewed   Cardiac Studies - Echo 2019: EF 30-25%, RV normal - Echo 10/21: EF 20%, RV moderately reduced  - [facility] 10/21 Findings:  Ao = 123/86 (104) LV = 126/8 RA = 2 RV = 33/8 PA = 32/5 (21) PCW = 4 Fick cardiac output/index = 8.2/3.3 PVR = 2.6 WU FA sat = 99% PA sat = 76%, 77%  Assessment: 1. 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 Well-compensated filling pressures     Past Medical History:  Diagnosis Date  . Acute congestive heart failure (HCC)   . Alcohol use   . Atrial flutter (HCC)   . Essential hypertension   . Hypertension    Phreesia 10/29/2020  . Tobacco use    Past Surgical History:   Procedure Laterality Date  . RIGHT/LEFT HEART CATH AND CORONARY ANGIOGRAPHY N/A 09/30/2020   Procedure: RIGHT/LEFT HEART CATH AND CORONARY ANGIOGRAPHY;  Surgeon: Dolores Patty, MD;  [facility];  Service: Cardiovascular;  Laterality: N/A;     Current Outpatient Medications  Medication Sig Dispense Refill  . amiodarone (PACERONE) 200 MG tablet Take 1 tablet (200 mg total) by mouth daily. 30 tablet 3  . apixaban (ELIQUIS) 5 MG TABS tablet TAKE 1 TABLET (5 MG TOTAL) BY MOUTH TWO TIMES DAILY. 60 tablet 6  . carvedilol (COREG) 6.25 MG tablet Take 1 tablet (6.25 mg total) by mouth 2 (two) times daily with a meal. 60 tablet 11  . dapagliflozin propanediol (FARXIGA) 10 MG TABS tablet Take 1 tablet (10 mg total) by mouth daily. 34 tablet 2  . hydrALAZINE (APRESOLINE) 50 MG tablet Take 1 tablet (50 mg total) by mouth 3 (three) times daily. 102 tablet 2  . isosorbide mononitrate (IMDUR) 30 MG 24 hr tablet Take 1 tablet (30 mg total) by mouth daily. 34 tablet 2  . sacubitril-valsartan (ENTRESTO) 97-103 MG Take 1 tablet by mouth 2 (two) times daily. 60 tablet 6  . sertraline (ZOLOFT) 50 MG tablet Take 100 mg by mouth daily.    Marland Kitchen spironolactone (ALDACTONE) 25 MG tablet Take 1 tablet (25 mg total) by mouth daily. 34 tablet 2   No current facility-administered medications for this encounter.    Allergies:   Patient has  no known allergies.   Social History:  The patient  reports that he has been smoking. He has never used smokeless tobacco. He reports current alcohol use. He reports that he does not use drugs.   Family History:  The patient's family history includes Breast cancer in his paternal grandfather; Diabetes in his paternal grandmother; Hypertension in his father and mother.   ROS:  Please see the history of present illness.   All other systems are personally reviewed and negative.   Vitals:   03/27/21 1448  Weight: 128.4 kg (283 lb)    Exam:   General:  Well  appearing. No resp difficulty HEENT: normal Neck: supple. no JVD. Carotids 2+ bilat; no bruits. No lymphadenopathy or thryomegaly appreciated. Cor: PMI nondisplaced. Regular rate & rhythm. No rubs, gallops or murmurs. Lungs: clear Abdomen: soft, nontender, nondistended. No hepatosplenomegaly. No bruits or masses. Good bowel sounds. Extremities: no cyanosis, clubbing, rash, edema Neuro: alert & orientedx3, cranial nerves grossly intact. moves all 4 extremities w/o difficulty. Affect pleasant    Recent Labs: 09/17/2020: B Natriuretic Peptide 1,001.0; TSH 4.471 09/24/2020: ALT 21 09/30/2020: Magnesium 2.4 10/09/2020: Hemoglobin 14.2; Platelets 361 10/16/2020: BUN 14; Creatinine, Ser 1.04; Potassium 4.0; Sodium 138  Personally reviewed   Wt Readings from Last 3 Encounters:  03/27/21 128.4 kg (283 lb)  12/19/20 121.1 kg (267 lb)  11/29/20 121.6 kg (268 lb)      ASSESSMENT AND PLAN:  1. 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  - Admit for a/c decompensated HF 10/21 c/b SVT leading to cardiogenic shock  - Echo 10/21: EF 20%, RV moderately reduced  - R/LHC showed normal coronaries, EF 40-45%, and compensated filling pressures.Fick cardiac output/index = 8.2/3.3. D/c Wt 261 lb -Echo today 03/27/21 EF 60-65 mod LVH G2DD Personally reviewed - Doing great NYHA I. Volume status looks good.  - Continue Entresto 97-103 mg bid - Continue Spiro 25 mg daily  - Continue Coreg 3.125 mg bid - Continue Imdur 30 mg daily  - Continue hydralazine 50 mg tid - Continue Farxiga 10 mg daily.   2. SVT - recent admit 10/21 w/ hemodynamically unstable SVT requiring emergent DCCV  - continue ? blocker - he stopped ETOH  3. PAF - Has not recurred - Will cut amio to 100 daily and stop at next visit  - Continue Eliquis  - will ultimately need sleep study once he has insurance  (h/o snoring and HTN)  4. HTN:  - Blood pressure well controlled. Continue current regimen.  5. ETOH Abuse - says he is now abstinent. Congratulated him   6. Tobacco Abuse  - he has quit smoking  - congratulated on efforts     Signed, Arvilla Meres, MD  03/27/2021 2:51 PM  Advanced Heart Failure Clinic [facility] [address] Heart and Vascular Center [place] [address] [phone] (office) [phone] (fax)

## 2021-03-28 LAB — T3, FREE: T3, Free: 2.3 pg/mL (ref 2.0–4.4)

## 2021-03-28 NOTE — Addendum Note (Signed)
Encounter addended by: Dolores Patty, MD on: 03/28/2021 2:48 PM  Actions taken: Level of Service modified, Visit diagnoses modified

## 2021-04-29 ENCOUNTER — Other Ambulatory Visit (HOSPITAL_COMMUNITY): Payer: Self-pay

## 2021-07-10 ENCOUNTER — Other Ambulatory Visit (HOSPITAL_COMMUNITY): Payer: Self-pay

## 2021-07-10 MED FILL — Carvedilol Tab 6.25 MG: ORAL | 30 days supply | Qty: 60 | Fill #1 | Status: AC

## 2021-07-13 DIAGNOSIS — F411 Generalized anxiety disorder: Secondary | ICD-10-CM | POA: Insufficient documentation

## 2021-07-28 ENCOUNTER — Ambulatory Visit: Payer: Self-pay | Admitting: Family Medicine

## 2021-07-31 ENCOUNTER — Encounter (HOSPITAL_COMMUNITY): Payer: Self-pay | Admitting: Internal Medicine

## 2021-09-04 ENCOUNTER — Other Ambulatory Visit (HOSPITAL_COMMUNITY): Payer: Self-pay

## 2021-09-04 ENCOUNTER — Ambulatory Visit (HOSPITAL_COMMUNITY)
Admission: RE | Admit: 2021-09-04 | Discharge: 2021-09-04 | Disposition: A | Discharge status: Home / Self Care | Payer: Self-pay | Source: Ambulatory Visit | Attending: Internal Medicine | Admitting: Internal Medicine

## 2021-09-04 ENCOUNTER — Other Ambulatory Visit: Payer: Self-pay

## 2021-09-04 ENCOUNTER — Encounter (HOSPITAL_COMMUNITY): Payer: Self-pay | Admitting: Internal Medicine

## 2021-09-04 ENCOUNTER — Telehealth (HOSPITAL_COMMUNITY): Payer: Self-pay | Admitting: Pharmacy Technician

## 2021-09-04 VITALS — BP 110/70 | HR 90 | Wt 300.8 lb

## 2021-09-04 DIAGNOSIS — F17210 Nicotine dependence, cigarettes, uncomplicated: Secondary | ICD-10-CM | POA: Insufficient documentation

## 2021-09-04 DIAGNOSIS — Z79899 Other long term (current) drug therapy: Secondary | ICD-10-CM | POA: Insufficient documentation

## 2021-09-04 DIAGNOSIS — I10 Essential (primary) hypertension: Secondary | ICD-10-CM

## 2021-09-04 DIAGNOSIS — I5022 Chronic systolic (congestive) heart failure: Secondary | ICD-10-CM

## 2021-09-04 DIAGNOSIS — I110 Hypertensive heart disease with heart failure: Secondary | ICD-10-CM | POA: Insufficient documentation

## 2021-09-04 DIAGNOSIS — I480 Paroxysmal atrial fibrillation: Secondary | ICD-10-CM

## 2021-09-04 DIAGNOSIS — I429 Cardiomyopathy, unspecified: Secondary | ICD-10-CM | POA: Insufficient documentation

## 2021-09-04 DIAGNOSIS — Z7901 Long term (current) use of anticoagulants: Secondary | ICD-10-CM | POA: Insufficient documentation

## 2021-09-04 DIAGNOSIS — F1010 Alcohol abuse, uncomplicated: Secondary | ICD-10-CM

## 2021-09-04 DIAGNOSIS — I471 Supraventricular tachycardia: Secondary | ICD-10-CM | POA: Insufficient documentation

## 2021-09-04 DIAGNOSIS — Z8249 Family history of ischemic heart disease and other diseases of the circulatory system: Secondary | ICD-10-CM | POA: Insufficient documentation

## 2021-09-04 DIAGNOSIS — Z5900 Homelessness unspecified: Secondary | ICD-10-CM | POA: Insufficient documentation

## 2021-09-04 LAB — BASIC METABOLIC PANEL
Anion gap: 9 (ref 5–15)
BUN: 13 mg/dL (ref 6–20)
CO2: 26 mmol/L (ref 22–32)
Calcium: 9.1 mg/dL (ref 8.9–10.3)
Chloride: 105 mmol/L (ref 98–111)
Creatinine, Ser: 1.16 mg/dL (ref 0.61–1.24)
GFR, Estimated: >60 mL/min (ref >60)
Glucose, Bld: 97 mg/dL (ref 70–99)
Potassium: 4.1 mmol/L (ref 3.5–5.1)
Sodium: 140 mmol/L (ref 135–145)

## 2021-09-04 LAB — CBC
HCT: 43.4 % (ref 39.0–52.0)
Hemoglobin: 14.1 g/dL (ref 13.0–17.0)
MCH: 33.9 pg (ref 26.0–34.0)
MCHC: 32.5 g/dL (ref 30.0–36.0)
MCV: 104.3 fL — ABNORMAL HIGH (ref 80.0–100.0)
Platelets: 232 10*3/uL (ref 150–400)
RBC: 4.16 MIL/uL — ABNORMAL LOW (ref 4.22–5.81)
RDW: 12.1 % (ref 11.5–15.5)
WBC: 3.8 10*3/uL — ABNORMAL LOW (ref 4.0–10.5)
nRBC: 0 % (ref 0.0–0.2)

## 2021-09-04 LAB — BRAIN NATRIURETIC PEPTIDE: B Natriuretic Peptide: 37.7 pg/mL (ref 0.0–100.0)

## 2021-09-04 MED ORDER — SPIRONOLACTONE 25 MG PO TABS
25.0000 mg | ORAL_TABLET | Freq: Every day | ORAL | 2 refills | Status: DC
  Filled 2021-09-04: qty 30, 30d supply, fill #0
  Filled 2021-10-13: qty 30, 30d supply, fill #1
  Filled 2021-11-10: qty 30, 30d supply, fill #2

## 2021-09-04 NOTE — Progress Notes (Signed)
Medication Samples have been provided to the patient.  Drug name: Sherryll Burger       Strength: 49/51 mg        Qty: 2  LOT: [id number]  Exp.Date: 07/24  Dosing instructions: Take 2 tablets Twice daily   The patient has been instructed regarding the correct time, dose, and frequency of taking this medication, including desired effects and most common side effects.   Smitty Cords Anwar Crill 3:58 PM 09/04/2021

## 2021-09-04 NOTE — Progress Notes (Addendum)
Heart and Vascular Care Navigation  09/05/2021  Antonio Tapia October 19, 1981 [id number]  Reason for Referral: CSW consulted to speak with pt about lack of insurance.   Engaged with patient face to face for follow up visit for Heart and Vascular Care Coordination.                                                                                                   Assessment:     Pt has a job which he has been at for 6 months- thinks he can get insurance through them after he has worked for a certain time period but unsure how long he is supposed to work- CSW encouraged him to ask about insurance benefits to see if he is now eligible to apply and if not when their open enrollment is so he can apply then.  Pt was living in his car up until recently but has just signed a lease and is in the process of moving in- no financial concerns with this at this time.  Pt provided with CAFA application to try and help with [facility] bills.  CSW provided with [employer] number so he can call for refill as he is currently out.  Mom expressed concern regarding depression.  Pt was on zoloft and mom reports she could tell a difference but pt does not feel as if it helped and stopped it about a month ago.  CSW discussed that pt also admits to starting smoking again a few weeks ago due to stressors which could be a sign he was benefiting from the medication- pt feels as if this is a valid point and will contact his PCP about sending in another prescription for him.                                 HRT/VAS Care Coordination     Outpatient Care Team [facility]   Community Paramedic Name: Antonio Tapia 3/2 for lack of communication   Social Worker Name: Antonio Tapia- Advanced [facility] Clinic- [phone]   Living arrangements for the past 2 months Single Family Home   Lives with: Significant Other   Patient Current Insurance Coverage Self-Pay   Patient Has Concern With Paying Medical Bills Yes   Patient Concerns With Medical Bills  self pay with large Tapia bill   Medical Bill Referrals: [employer] submitting Medicaid application and sending referral to [place]ia for disability   Does Patient Have Prescription Coverage? No   Patient Prescription Assistance Programs Heart Failure Fund; Patient Assistance Programs   Patient Assistance Programs Medications [employer] app for entresto assistance submitted 10/18   Home Assistive Devices/Equipment None   DME Agency NA       Social History:  SDOH Screenings   Alcohol Screen: Medium Risk   Last Alcohol Screening Score (AUDIT): 11  Depression (PHQ2-9): Low Risk    PHQ-2 Score: 1  Financial Resource Strain: Low Risk    Difficulty of Paying Living Expenses: Not very hard  Food Insecurity: No Food Insecurity   Worried About Programme researcher, broadcasting/film/video in the Last Year: Never true   Ran Out of Food in the Last Year: Never true  Housing: Medium Risk   Last Housing Risk Score: 1  Physical Activity: Not on file  Social Connections: Not on file  Stress: Not on file  Tobacco Use: High Risk   Smoking Tobacco Use: Every Day   Smokeless Tobacco Use: Never  Transportation Needs: No Transportation Needs   Lack of Transportation (Medical): No   Lack of Transportation (Non-Medical): No    SDOH Interventions: Financial Resources:  Financial Strain Interventions: Other (Comment) (referred to job to apply for insurance benefits and provided with CAFA application)   Food Insecurity:  Food Insecurity Interventions: Intervention Not Indicated  Housing Insecurity:  Housing Interventions: Intervention Not Indicated (pt recently started to lease an apartment- no concerns with losing it at this time)  Transportation:   Transportation Interventions: Intervention Not Indicated pt has his own car    Other Care Navigation Interventions:     Inpatient/Outpatient Substance Abuse Counseling/Rehab Options Also admits  to drinking 2-3 times a week but only having a few drinks- feels in control of drinking at this time. Currently refusing resources.  Provided Pharmacy assistance resources Heart Failure Fund, Patient Assistance Programs   Patient expressed Mental Health concerns Yes, Referred to:  has struggled with depression in the past which contributed to drinking problems.  Was on Zoloft and mom reports that she saw a difference in his mood- pt went off of it a few weeks ago and does recognize starting bad habits again since that time (smoking).  Will follow up with PCP about getting refill for this medication  Patient Referred to: Encouraged to follow back up with psychologist who he had seen previously.   Follow-up plan:    Pt to call PCP to get refill on zoloft.  Pt to speak with employer to confirm if he is now eligible to apply for insurance and will work on CAFA to help with outstanding [facility] bills.  Antonio Sis, LCSW Clinical Social Worker Advanced Heart Failure Clinic Desk#: [phone] Cell#: [phone]

## 2021-09-04 NOTE — Telephone Encounter (Signed)
Advanced Heart Failure Patient Advocate Encounter   Received a notice from [employer] that it is time to renew [name] assistance. Patient was seen in clinic today and renewal application has been started. Will fax in once all signatures are obtained.

## 2021-09-04 NOTE — Progress Notes (Signed)
Advanced Heart Failure Clinic Note    Date:  09/04/2021   ID:  Antonio Tapia, DOB [birth year], MRN [id number]  Location: Home  Provider location: [place] [facility] Type of Visit: Established patient  PCP:  Arvilla Market, MD  Cardiologist:  None Primary HF: Antonio Tapia  Chief Complaint: Heart Failure follow-up   History of Present Illness:  [name] is a 40 year old with history of ETOH abuse, A flutter, tobacco abuse, and chronic systolic heart failure.    Admitted 10/21 with ADHF and marked volume overload. On the day of admit he continued to decompensate with SVT.  Underwent Emergent DC-CV and intubation. Placed on Norepi + milrinone+ amio drip. Gradually improved and RHC/LHC on 09/30/20 with normal coronaries, EF 40-45% and well compensated filling pressures. D/c wt was 261 lb.    Last seen in 4/22 and was doing well. Echo with EF 60-65%  Says for past few months was essentially homeless - living out of his car. Signed a lease 2 days ago. Has been off all meds for several months. Says he has felt ok. 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 Denies CP, SOB orthopnea or PND. Still drinking ETOH. Drinking 2 cans of high alcohol beer to go to sleep. Also smoking a few cigarettes.   Echo 03/27/21 EF 60-65% mod LVH G2DD Personally reviewed    Cardiac Studies - Echo 2019: EF 30-25%, RV normal - Echo 10/21: EF 20%, RV moderately reduced  - [facility] 10/21 Findings:   Ao = 123/86 (104) LV = 126/8 RA =  2 RV = 33/8 PA = 32/5 (21) PCW = 4 Fick cardiac output/index = 8.2/3.3 PVR = 2.6 WU FA sat = 99% PA sat = 76%, 77%   Assessment: 1. 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 Well-compensated filling pressures      Past Medical History:  Diagnosis Date   Acute congestive heart failure (HCC)    Alcohol use    Atrial flutter (HCC)    Essential hypertension    Hypertension    Phreesia 10/29/2020   Tobacco use    Past Surgical History:   Procedure Laterality Date   RIGHT/LEFT HEART CATH AND CORONARY ANGIOGRAPHY N/A 09/30/2020   Procedure: RIGHT/LEFT HEART CATH AND CORONARY ANGIOGRAPHY;  Surgeon: Dolores Patty, MD;  Location: [facility] INV[facility];  Service: Cardiovascular;  Laterality: N/A;     Current Outpatient Medications  Medication Sig Dispense Refill   amiodarone (PACERONE) 200 MG tablet Take 0.5 tablets (100 mg total) by mouth daily. 30 tablet 3   apixaban (ELIQUIS) 5 MG TABS tablet TAKE 1 TABLET (5 MG TOTAL) BY MOUTH TWO TIMES DAILY. 60 tablet 6   carvedilol (COREG) 6.25 MG tablet Take 1 tablet (6.25 mg total) by mouth 2 (two) times daily with a meal. 60 tablet 11   dapagliflozin propanediol (FARXIGA) 10 MG TABS tablet Take 1 tablet (10 mg total) by mouth daily. 34 tablet 2   hydrALAZINE (APRESOLINE) 50 MG tablet Take 1 tablet (50 mg total) by mouth 3 (three) times daily. 102 tablet 2   isosorbide mononitrate (IMDUR) 30 MG 24 hr tablet Take 1 tablet (30 mg total) by mouth daily. 34 tablet 2   sacubitril-valsartan (ENTRESTO) 97-103 MG Take 1 tablet by mouth 2 (two) times daily. 60 tablet 6   sertraline (ZOLOFT) 50 MG tablet Take 100 mg by mouth daily.     spironolactone (ALDACTONE) 25 MG tablet Take 1 tablet (25  mg total) by mouth daily. 34 tablet 2   No current facility-administered medications for this encounter.    Allergies:   Patient has no known allergies.   Social History:  The patient  reports that he has been smoking. He has never used smokeless tobacco. He reports current alcohol use. He reports that he does not use drugs.   Family History:  The patient's family history includes Breast cancer in his paternal grandfather; Diabetes in his paternal grandmother; Hypertension in his father and mother.   ROS:  Please see the history of present illness.   All other systems are personally reviewed and negative.   Vitals:   09/04/21 1437  BP: 110/70  Pulse: 90  SpO2: 99%  Weight: (!) 136.4 kg (300 lb  12.8 oz)    Exam:   General:  Well appearing. No resp difficulty HEENT: normal Neck: supple. no JVD. Carotids 2+ bilat; no bruits. No lymphadenopathy or thryomegaly appreciated. Cor: PMI nondisplaced. Regular rate & rhythm. No rubs, gallops or murmurs. Lungs: clear Abdomen: obese soft, nontender, nondistended. No hepatosplenomegaly. No bruits or masses. Good bowel sounds. Extremities: no cyanosis, clubbing, rash, edema Neuro: alert & orientedx3, cranial nerves grossly intact. moves all 4 extremities w/o difficulty. Affect pleasant  ECG: NSR 83 LVH with repol Personally reviewed  Recent Labs: 09/17/2020: B Natriuretic Peptide 1,001.0 09/30/2020: Magnesium 2.4 03/27/2021: ALT 19; BUN 16; Creatinine, Ser 1.22; Hemoglobin 14.9; Platelets 237; Potassium 4.2; Sodium 138; TSH 2.445  Personally reviewed   Wt Readings from Last 3 Encounters:  09/04/21 (!) 136.4 kg (300 lb 12.8 oz)  03/27/21 128.4 kg (283 lb)  12/19/20 121.1 kg (267 lb)      ASSESSMENT AND PLAN:  1. 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  - Admit ADHF 10/21 c/b SVT leading to cardiogenic shock  - Echo 10/21: EF 20%, RV moderately reduced  - R/LHC showed normal coronaries, EF 40-45%, and compensated filling pressures. Fick cardiac output/index = 8.2/3.3. D/c Wt 261 lb - Echo 03/27/21 EF 60-65 mod LVH G2DD  - Has been off all meds due to his social situation. Fortunately he is symptomatically OK. NYHA I - No evidence of volume overload - Will attempt to keep med regimen as simple as possible - Restart Entresto 97-103 mg bid - Restart Spiro 25 mg daily  - Will not restart Coreg 3.125 mg bid, Imdur 30 mg daily, hydralazine 50 mg tid or Farxiga 10 mg st this time - Repeat echo. Check labs   2. SVT - admit 10/21 w/ hemodynamically unstable SVT requiring emergent DCCV  - he has been off amio. in NSR  today.    3. PAF - Has not recurred. Off amio - At risk for recurrence - Wants to defer restarting Eliquis at this time  4. HTN:  - Blood pressure well controlled despite being out of meds   5. ETOH Abuse - encouraged him to quite    6. Tobacco Abuse  - encouraged him to quit  7. Homelessness/SDOH needs - I have referred to HF SW here in clinic who saw him today  Total time spent 35 minutes. Over half that time spent discussing above.   Signed, Arvilla Meres, MD  09/04/2021 2:56 PM  Advanced Heart Failure Clinic [facility] [address] Heart and Vascular [place] [address] [phone] (office) [phone] (fax)

## 2021-09-04 NOTE — Patient Instructions (Addendum)
EKG done today.  Labs done today. We will contact you only if your labs are abnormal.  STOP taking Amiodarone  STOP taking Carvedilol  STOP taking Farxiga  STOP taking Hydralazine  STOP taking Eliquis  STOP taking Imdur  No other medication changes were made. Please continue all current medications as prescribed.  Your physician recommends that you schedule a follow-up appointment in: 3 months with an echo prior to your exam.  If you have any questions or concerns before your next appointment please send [place] a message through [place] or call our office at [phone].    TO LEAVE A MESSAGE FOR THE NURSE SELECT OPTION 2, PLEASE LEAVE A MESSAGE INCLUDING: YOUR NAME DATE OF BIRTH CALL BACK NUMBER REASON FOR CALL**this is important as we prioritize the call backs  YOU WILL RECEIVE A CALL BACK THE SAME DAY AS LONG AS YOU CALL BEFORE 4:00 PM   Do the following things EVERYDAY: Weigh yourself in the morning before breakfast. Write it down and keep it in a log. Take your medicines as prescribed Eat low salt foods--Limit salt (sodium) to 2000 mg per day.  Stay as active as you can everyday Limit all fluids for the day to less than 2 liters   At the Advanced [facility] Failure Clinic, you and your health needs are our priority. As part of our continuing mission to provide you with exceptional heart care, we have created designated Provider Care Teams. These Care Teams include your primary Cardiologist (physician) and Advanced Practice Providers (APPs- Physician Assistants and Nurse Practitioners) who all work together to provide you with the care you need, when you need it.   You may see any of the following providers on your designated Care Team at your next follow up: Dr Arvilla Meres Dr Carron Curie, NP Robbie Lis, [place] Karle Plumber, PharmD   Please be sure to bring in all your medications bottles to every appointment.

## 2021-09-10 NOTE — Telephone Encounter (Signed)
Sent in application via fax.  Will follow up.  

## 2021-09-22 NOTE — Telephone Encounter (Signed)
Advanced Heart Failure Patient Advocate Encounter   Patient was approved to receive Entresto from [employer]  Patient ID: [id number] Effective dates: 09/12/21 through 09/30/22  Archer Asa, CPhT

## 2021-10-09 ENCOUNTER — Telehealth (HOSPITAL_COMMUNITY): Payer: Self-pay | Admitting: Licensed Clinical Social Worker

## 2021-10-09 NOTE — Telephone Encounter (Signed)
CSW reaching out to patient because they are currently receiving medications through Heart Failure Fund.  CSW called to discuss if pt is eligible for any alternative coverage options.  Unable to reach- left VM requesting return call.  CSW will continue to follow to assist as needed.  Tamerra Merkley H. Edelmiro Innocent, LCSW Clinical Social Worker Advanced [facility] Failure Clinic Desk#: [phone] Cell#: [phone]  

## 2021-10-13 ENCOUNTER — Other Ambulatory Visit (HOSPITAL_COMMUNITY): Payer: Self-pay

## 2021-11-10 ENCOUNTER — Other Ambulatory Visit (HOSPITAL_COMMUNITY): Payer: Self-pay

## 2021-12-04 ENCOUNTER — Encounter (HOSPITAL_COMMUNITY): Payer: Self-pay | Admitting: Internal Medicine

## 2021-12-04 ENCOUNTER — Ambulatory Visit (HOSPITAL_BASED_OUTPATIENT_CLINIC_OR_DEPARTMENT_OTHER)
Admission: RE | Admit: 2021-12-04 | Discharge: 2021-12-04 | Disposition: A | Discharge status: Home / Self Care | Payer: Self-pay | Source: Ambulatory Visit | Attending: Internal Medicine | Admitting: Internal Medicine

## 2021-12-04 ENCOUNTER — Ambulatory Visit (HOSPITAL_COMMUNITY)
Admission: RE | Admit: 2021-12-04 | Discharge: 2021-12-04 | Disposition: A | Discharge status: Home / Self Care | Payer: Self-pay | Source: Ambulatory Visit | Attending: Internal Medicine | Admitting: Internal Medicine

## 2021-12-04 ENCOUNTER — Other Ambulatory Visit: Payer: Self-pay

## 2021-12-04 VITALS — BP 140/80 | HR 88 | Wt 296.2 lb

## 2021-12-04 DIAGNOSIS — I509 Heart failure, unspecified: Secondary | ICD-10-CM

## 2021-12-04 DIAGNOSIS — I5022 Chronic systolic (congestive) heart failure: Secondary | ICD-10-CM | POA: Insufficient documentation

## 2021-12-04 DIAGNOSIS — I480 Paroxysmal atrial fibrillation: Secondary | ICD-10-CM | POA: Insufficient documentation

## 2021-12-04 DIAGNOSIS — I110 Hypertensive heart disease with heart failure: Secondary | ICD-10-CM | POA: Insufficient documentation

## 2021-12-04 DIAGNOSIS — Z7901 Long term (current) use of anticoagulants: Secondary | ICD-10-CM | POA: Insufficient documentation

## 2021-12-04 DIAGNOSIS — F1010 Alcohol abuse, uncomplicated: Secondary | ICD-10-CM

## 2021-12-04 DIAGNOSIS — Z09 Encounter for follow-up examination after completed treatment for conditions other than malignant neoplasm: Secondary | ICD-10-CM | POA: Insufficient documentation

## 2021-12-04 DIAGNOSIS — Z79899 Other long term (current) drug therapy: Secondary | ICD-10-CM | POA: Insufficient documentation

## 2021-12-04 DIAGNOSIS — Z87891 Personal history of nicotine dependence: Secondary | ICD-10-CM | POA: Insufficient documentation

## 2021-12-04 DIAGNOSIS — I471 Supraventricular tachycardia: Secondary | ICD-10-CM | POA: Insufficient documentation

## 2021-12-04 DIAGNOSIS — Z5900 Homelessness unspecified: Secondary | ICD-10-CM | POA: Insufficient documentation

## 2021-12-04 DIAGNOSIS — I10 Essential (primary) hypertension: Secondary | ICD-10-CM

## 2021-12-04 LAB — BASIC METABOLIC PANEL
Anion gap: 6 (ref 5–15)
BUN: 14 mg/dL (ref 6–20)
CO2: 26 mmol/L (ref 22–32)
Calcium: 8.7 mg/dL — ABNORMAL LOW (ref 8.9–10.3)
Chloride: 104 mmol/L (ref 98–111)
Creatinine, Ser: 1 mg/dL (ref 0.61–1.24)
GFR, Estimated: >60 mL/min (ref >60)
Glucose, Bld: 88 mg/dL (ref 70–99)
Potassium: 3.9 mmol/L (ref 3.5–5.1)
Sodium: 136 mmol/L (ref 135–145)

## 2021-12-04 LAB — ECHOCARDIOGRAM COMPLETE
Area-P 1/2: 5.27 cm2
Calc EF: 61.1 %
S' Lateral: 2.8 cm
Single Plane A2C EF: 63.3 %
Single Plane A4C EF: 61.2 %

## 2021-12-04 NOTE — Patient Instructions (Signed)
Medication Changes:  No change  Lab Work:  Labs done today, your results will be available in MyChart, we will contact you for abnormal readings.   Testing/Procedures:  none  Referrals:  none  Special Instructions // Education:  none  Follow-Up in: 9 months (September 2023) **Call in August 2023 for follow up appointment**  At the [facility] Clinic, you and your health needs are our priority. We have a designated team specialized in the treatment of Heart Failure. This Care Team includes your primary Heart Failure Specialized Cardiologist (physician), Advanced Practice Providers (APPs- Physician Assistants and Nurse Practitioners), and Pharmacist who all work together to provide you with the care you need, when you need it.   You may see any of the following providers on your designated Care Team at your next follow up:  Dr Arvilla Meres Dr Carron Curie, NP Robbie Lis, [place] [facility], [place] Karle Plumber, PharmD   Please be sure to bring in all your medications bottles to every appointment.   Need to Contact us:  If you have any questions or concerns before your next appointment please send [place] a message through [place] or call our office at [phone].    TO LEAVE A MESSAGE FOR THE NURSE SELECT OPTION 2, PLEASE LEAVE A MESSAGE INCLUDING: YOUR NAME DATE OF BIRTH CALL BACK NUMBER REASON FOR CALL**this is important as we prioritize the call backs  YOU WILL RECEIVE A CALL BACK THE SAME DAY AS LONG AS YOU CALL BEFORE 4:00 PM

## 2021-12-04 NOTE — Progress Notes (Signed)
Advanced [facility] Failure Clinic Note    Date:  12/04/2021   ID:  Antonio Tapia, DOB 1981-02-11, MRN [id number]  Location: Home  Provider location: [facility] Type of Visit: Established patient  PCP:  Arvilla Market, MD  Cardiologist:  None Primary HF: Thelbert Gartin  Chief Complaint: Heart Failure follow-up   History of Present Illness: Antonio Tapia is a 40 year old with history of ETOH abuse, A flutter, tobacco abuse, and chronic systolic heart failure.    Admitted 10/21 with ADHF and marked volume overload. On the day of admit he continued to decompensate with SVT.  Underwent Emergent DC-CV and intubation. Placed on Norepi + milrinone+ amio drip. Gradually improved and RHC/LHC on 09/30/20 with normal coronaries, EF 40-45% and well compensated filling pressures. D/c wt was 261 lb.    Last seen in 4/22 and was doing well. Echo with EF 60-65%  Today he returns for HF follow up.Overall feeling fine. Denies SOB/PND/Orthopnea. Appetite ok. No fever or chills. Weight at home 290 pounds. Taking all medications. Drinking 6 beers a week with 2 shots. Lives in apartment. No longer smoking. Working full time.   Echo 03/27/21 EF 60-65% mod LVH G2DD Personally reviewed    Cardiac Studies - Echo 2019: EF 30-25%, RV normal - Echo 10/21: EF 20%, RV moderately reduced  -Echo 03/27/21 EF 60-65% mod LVH G2DD Personally reviewed  - [facility] 10/21 Findings:   Ao = 123/86 (104) LV = 126/8 RA =  2 RV = 33/8 PA = 32/5 (21) PCW = 4 Fick cardiac output/index = 8.2/3.3 PVR = 2.6 WU FA sat = 99% PA sat = 76%, 77%   Assessment: 1. 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 Well-compensated filling pressures      Past Medical History:  Diagnosis Date   Acute congestive heart failure (HCC)    Alcohol use    Atrial flutter (HCC)    Essential hypertension    Hypertension    Phreesia 10/29/2020   Tobacco use    Past Surgical History:  Procedure  Laterality Date   RIGHT/LEFT HEART CATH AND CORONARY ANGIOGRAPHY N/A 09/30/2020   Procedure: RIGHT/LEFT HEART CATH AND CORONARY ANGIOGRAPHY;  Surgeon: Dolores Patty, MD;  [facility];  Service: Cardiovascular;  Laterality: N/A;     Current Outpatient Medications  Medication Sig Dispense Refill   sacubitril-valsartan (ENTRESTO) 97-103 MG Take 1 tablet by mouth 2 (two) times daily. 60 tablet 6   spironolactone (ALDACTONE) 25 MG tablet Take 1 tablet (25 mg total) by mouth daily. 30 tablet 2   No current facility-administered medications for this encounter.    Allergies:   Patient has no known allergies.   Social History:  The patient  reports that he has been smoking. He has never used smokeless tobacco. He reports current alcohol use. He reports that he does not use drugs.   Family History:  The patient's family history includes Breast cancer in his paternal grandfather; Diabetes in his paternal grandmother; Hypertension in his father and mother.   ROS:  Please see the history of present illness.   All other systems are personally reviewed and negative.   Vitals:   12/04/21 1509  BP: 140/80  Pulse: 88  SpO2: 97%  Weight: 134.4 kg (296 lb 3.2 oz)    Exam:   General:  Well appearing. No resp difficulty HEENT: normal Neck: supple. no JVD. Carotids 2+ bilat; no bruits. No lymphadenopathy or  thryomegaly appreciated. Cor: PMI nondisplaced. Regular rate & rhythm. No rubs, gallops or murmurs. Lungs: clear Abdomen: soft, nontender, nondistended. No hepatosplenomegaly. No bruits or masses. Good bowel sounds. Extremities: no cyanosis, clubbing, rash, edema Neuro: alert & orientedx3, cranial nerves grossly intact. moves all 4 extremities w/o difficulty. Affect pleasant  ECG: SR 80 bpm   Recent Labs: 03/27/2021: ALT 19; TSH 2.445 09/04/2021: B Natriuretic Peptide 37.7; BUN 13; Creatinine, Ser 1.16; Hemoglobin 14.1; Platelets 232; Potassium 4.1; Sodium 140   Personally reviewed   Wt Readings from Last 3 Encounters:  12/04/21 134.4 kg (296 lb 3.2 oz)  09/04/21 (!) 136.4 kg (300 lb 12.8 oz)  03/27/21 128.4 kg (283 lb)      ASSESSMENT AND PLAN:  1. 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  - Admit ADHF 10/21 c/b SVT leading to cardiogenic shock  - Echo 10/21: EF 20%, RV moderately reduced  - R/LHC showed normal coronaries, EF 40-45%, and compensated filling pressures. Fick cardiac output/index = 8.2/3.3. D/c Wt 261 lb - Echo 03/27/21 EF 60-65 mod LVH G2DD  - Echo today EF 60-65%.  - NYHA I. Volume status stable.  - Continue  Entresto 97-103 mg bid - Continue  Spiro 25 mg daily  - Failed SGLT2i in past   2. SVT - admit 10/21 w/ hemodynamically unstable SVT requiring emergent DCCV  - he has been off amio. in NSR today.    3. PAF - In SR today.  - Has not recurred. Off amio - At risk for recurrence - Wants to defer restarting Eliquis at this time  4. HTN:  - Blood pressure well controlled. Compliant with meds  5. ETOH Abuse - has cut way back    6. Tobacco Abuse  - Quit smoking.   7. Homelessness/SDOH needs - Now has an apartment. Working full time.   Follow up in 9 months.   Waneta Martins, NP  12/04/2021 3:40 PM  Advanced Heart Failure Clinic [facility] [address] [facility] [place] [address] [phone] (office) [phone] (fax)  Patient seen and examined with the above-signed Advanced Practice Provider and/or Housestaff. I personally reviewed laboratory data, imaging studies and relevant notes. I independently examined the patient and formulated the important aspects of the plan. I have edited the note to reflect any of my changes or salient points. I have personally discussed the plan with the patient and/or family.  Feels good. Denies CP, SOB. Has  cut ETOH way back  Echo today 12/04/21 EF 60-65%  General:  Well appearing. No resp difficulty HEENT: normal Neck: supple. no JVD. Carotids 2+ bilat; no bruits. No lymphadenopathy or thryomegaly appreciated. Cor: PMI nondisplaced. Regular rate & rhythm. No rubs, gallops or murmurs. Lungs: clear Abdomen: soft, nontender, nondistended. No hepatosplenomegaly. No bruits or masses. Good bowel sounds. Extremities: no cyanosis, clubbing, rash, edema Neuro: alert & orientedx3, cranial nerves grossly intact. moves all 4 extremities w/o difficulty. Affect pleasant  Overall doing well. Stressed need to stop ETOH completely. Remains compliant with Entresto and spiro. Watch BP. Check labs.   Arvilla Meres, MD  4:50 PM

## 2021-12-04 NOTE — Progress Notes (Signed)
°  Echocardiogram 2D Echocardiogram has been performed.  Augustine Radar 12/04/2021, 2:43 PM

## 2021-12-18 ENCOUNTER — Other Ambulatory Visit (HOSPITAL_COMMUNITY): Payer: Self-pay | Admitting: Internal Medicine

## 2021-12-18 ENCOUNTER — Other Ambulatory Visit (HOSPITAL_COMMUNITY): Payer: Self-pay

## 2021-12-18 MED ORDER — SPIRONOLACTONE 25 MG PO TABS
25.0000 mg | ORAL_TABLET | Freq: Every day | ORAL | 6 refills | Status: DC
  Filled 2021-12-18: qty 30, 30d supply, fill #0
  Filled 2022-01-27: qty 30, 30d supply, fill #1
  Filled 2022-03-15: qty 30, 30d supply, fill #2
  Filled 2022-05-01: qty 30, 30d supply, fill #3
  Filled 2022-06-20: qty 30, 30d supply, fill #4
  Filled 2022-08-12: qty 30, 30d supply, fill #5
  Filled 2022-10-16: qty 30, 30d supply, fill #6

## 2022-01-27 ENCOUNTER — Other Ambulatory Visit (HOSPITAL_COMMUNITY): Payer: Self-pay

## 2022-03-16 ENCOUNTER — Other Ambulatory Visit (HOSPITAL_COMMUNITY): Payer: Self-pay

## 2022-04-06 ENCOUNTER — Other Ambulatory Visit (HOSPITAL_COMMUNITY): Payer: Self-pay

## 2022-04-10 ENCOUNTER — Telehealth (HOSPITAL_COMMUNITY): Payer: Self-pay | Admitting: Licensed Clinical Social Worker

## 2022-04-10 NOTE — Telephone Encounter (Signed)
CSW received VM from pt requesting CSW reach out to him- attempted to return call but unable to reach- left VM  ? ?Burna Sis, LCSW ?Clinical Social Worker ?[facility] Failure Clinic ?Desk#: [phone] ?Cell#: [phone] ? ?

## 2022-04-10 NOTE — Telephone Encounter (Signed)
Pt returned call- he was wanting to know how to order more Entresto.  See from patient advocate notes that pt is enrolled through October of this year so provided him with the number to [employer] to order more medication ? ?No further needs at this time ? ?Burna Sis, LCSW ?Clinical Social Worker ?[facility] ?Desk#: [phone] ?Cell#: [phone] ? ?

## 2022-04-26 IMAGING — DX DG CHEST 1V PORT
1 series · 1 of 1 positions shown · non-contrast
Comparison: 09/17/2020

CLINICAL DATA: 9 dyspnea. Respiratory failure. Endotracheal tube
present.

EXAM:
PORTABLE CHEST 1 VIEW

[chest ap]
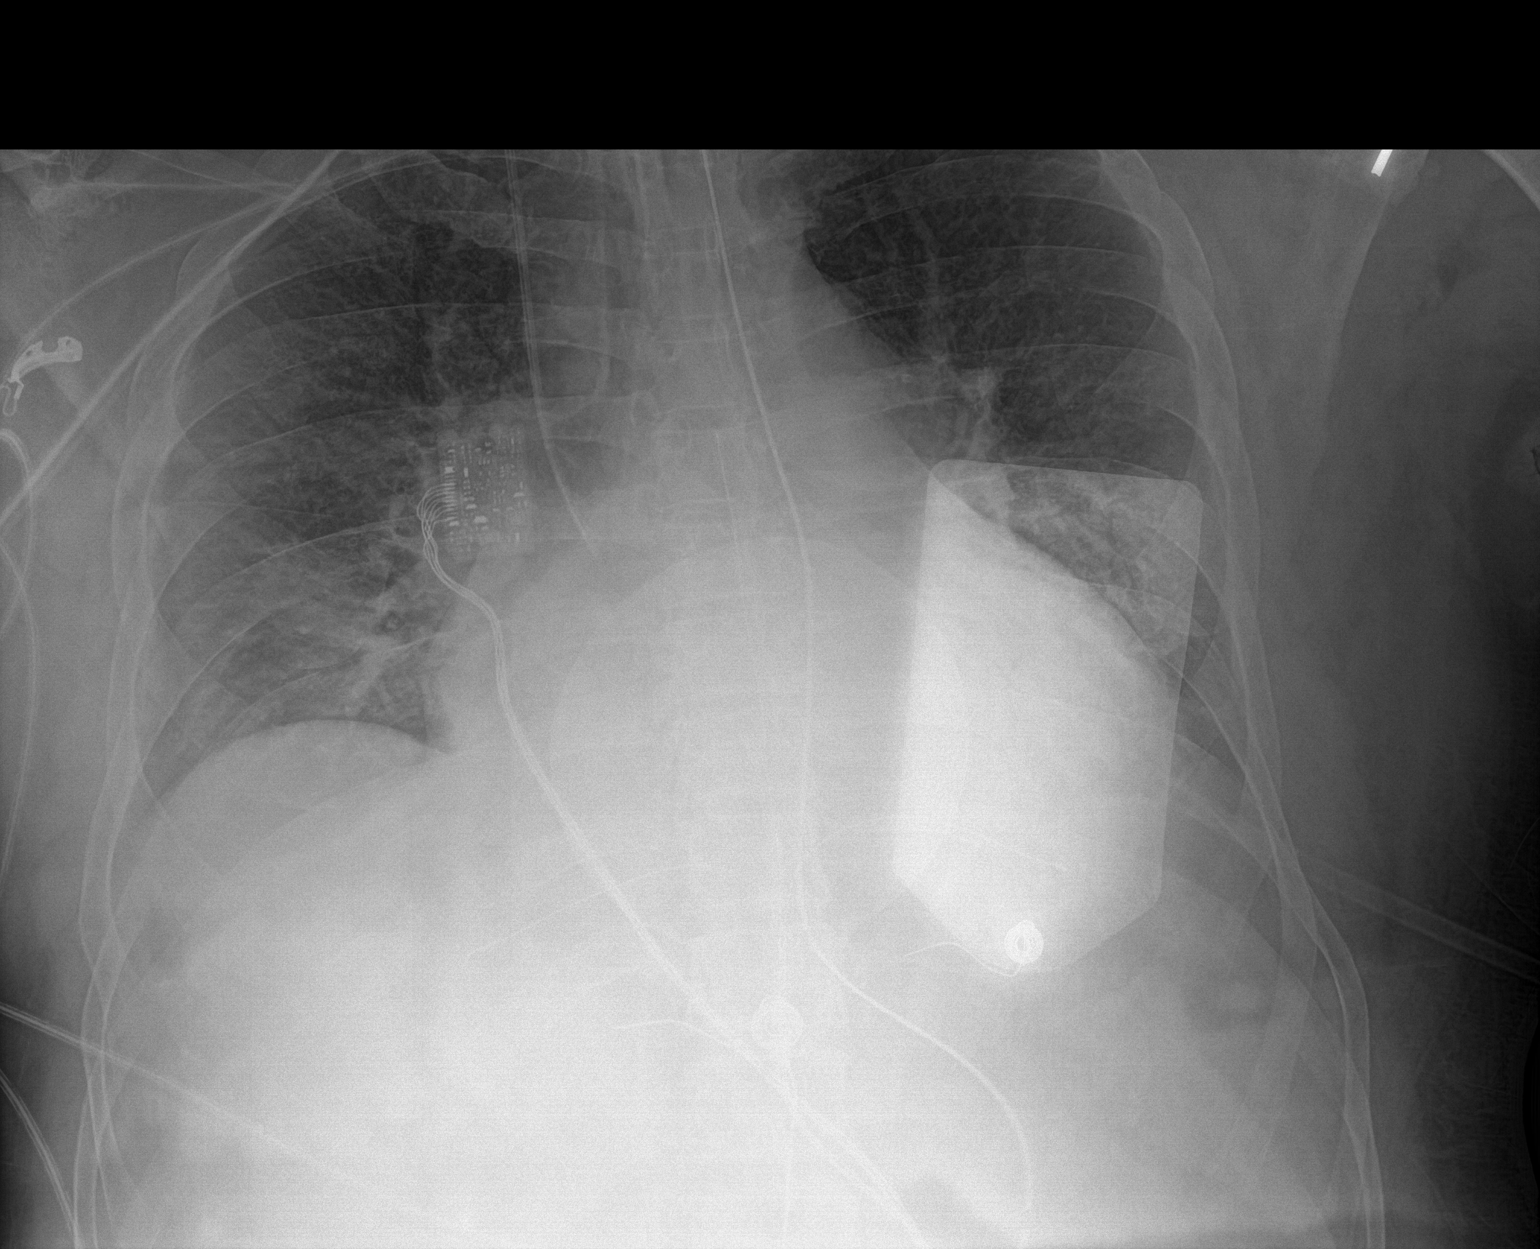

[1 of 1 positions shown; findings below may reference images not displayed]

FINDINGS: Endotracheal tube is in place, tip 4.6 centimeters above the carina.
Nasogastric tube is in place, tip beyond the gastroesophageal
junction. RIGHT IJ central line tip overlies the superior vena cava.

Heart is enlarged and stable in configuration. LEFT lung base is
partially obscured by overlying support apparatus. Mildly prominent
interstitial markings are stable. Patchy opacity at the RIGHT lung
base is consistent atelectasis or early infiltrate.
IMPRESSION: 1. Stable cardiomegaly.
2. RIGHT lower lobe atelectasis or infiltrate.

## 2022-04-29 IMAGING — CT CT CHEST W/O CM
2 of 4 series · 15 of 36 positions shown, 18 images · non-contrast
Comparison: Chest radiograph earlier same day.

CLINICAL DATA: Patient with acute respiratory failure secondary to
pulmonary edema.

EXAM:
CT CHEST WITHOUT CONTRAST
TECHNIQUE: Multidetector CT imaging of the chest was performed following the
standard protocol without IV contrast.

[Series 3: chest wo · axial · 0.79mm/px · z∈[-336,-66]mm · 12 of 161 slices shown, 15 images]
[im 13/161  mediastinal]
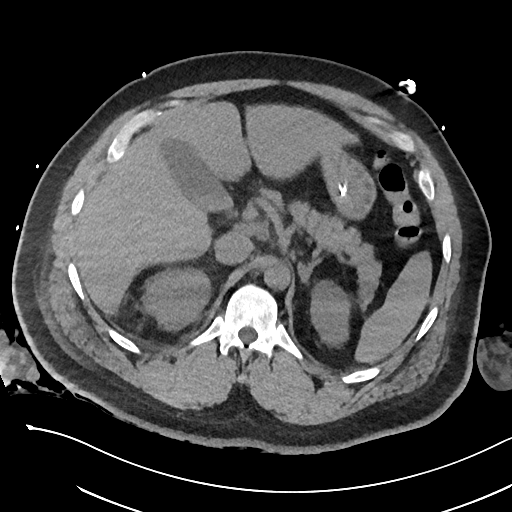
[im 13/161  lung]
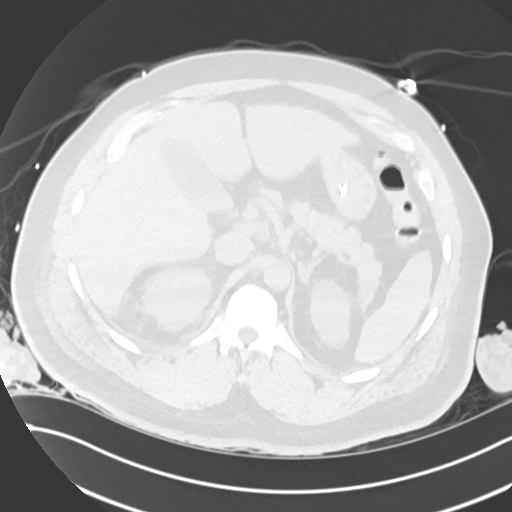
[im 25/161  lung]
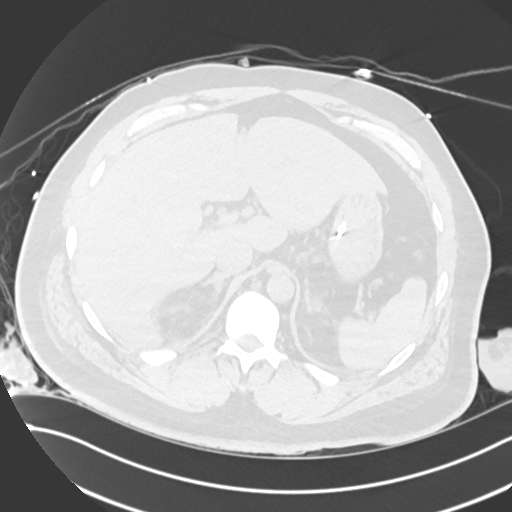
[im 37/161  lung]
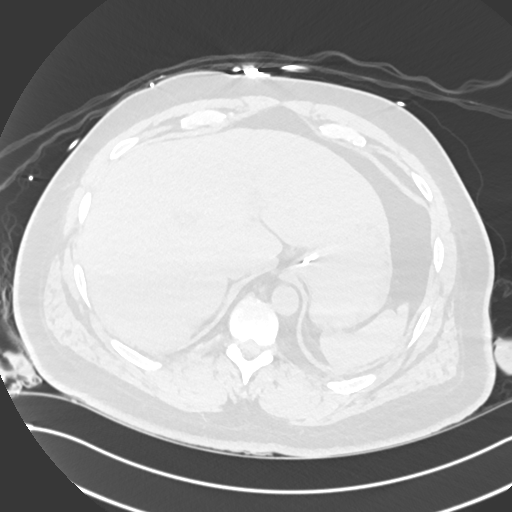
[im 50/161  lung]
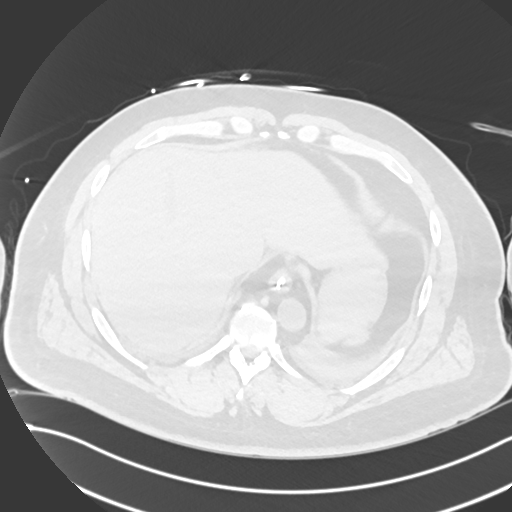
[im 62/161  mediastinal]
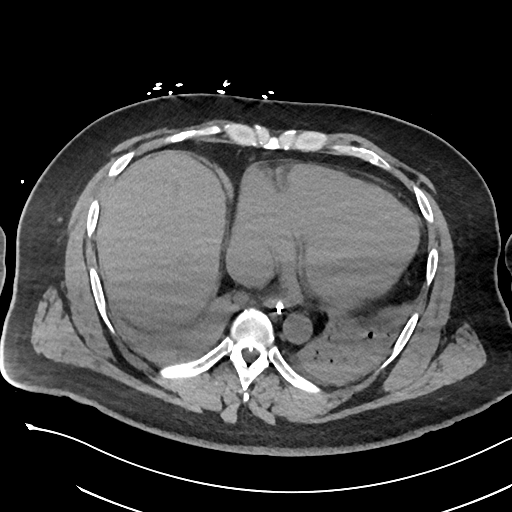
[im 62/161  lung]
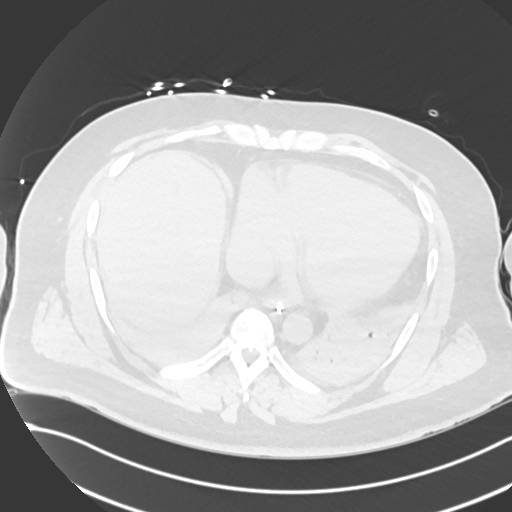
[im 74/161  lung]
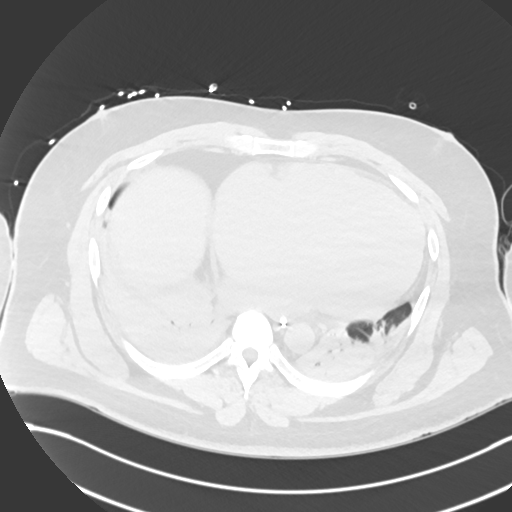
[im 87/161  lung]
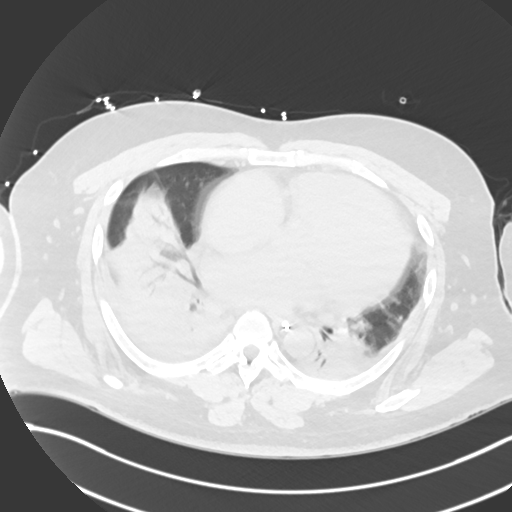
[im 99/161  lung]
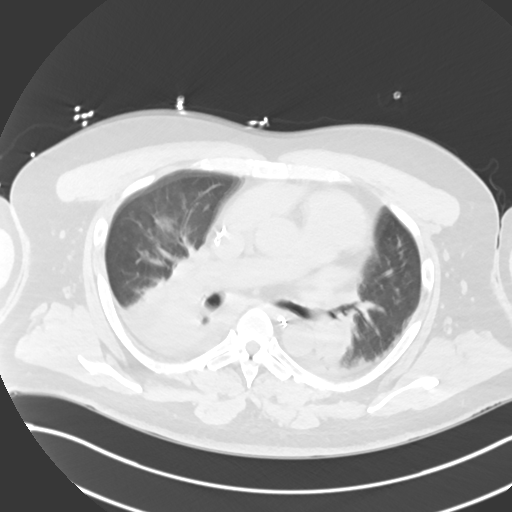
[im 111/161  mediastinal]
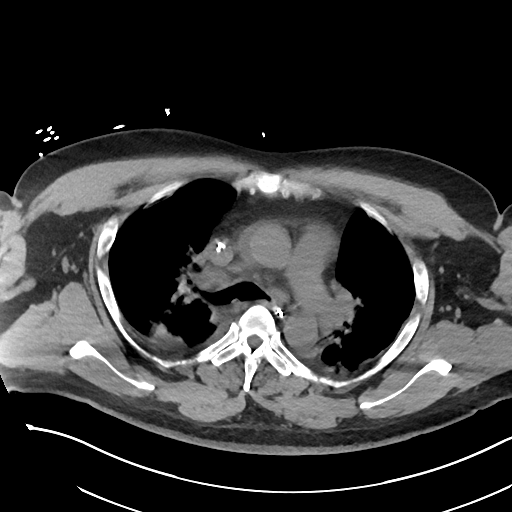
[im 111/161  lung]
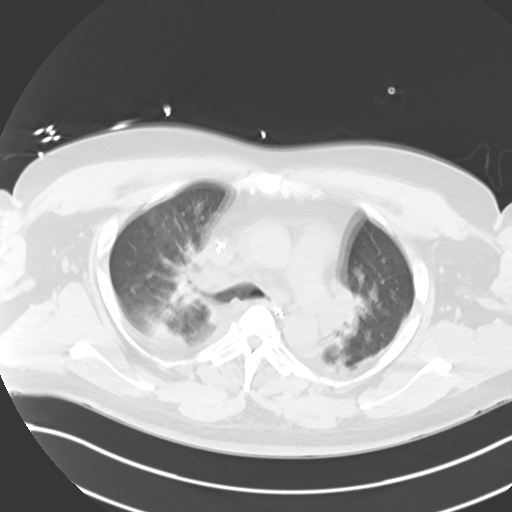
[im 124/161  lung]
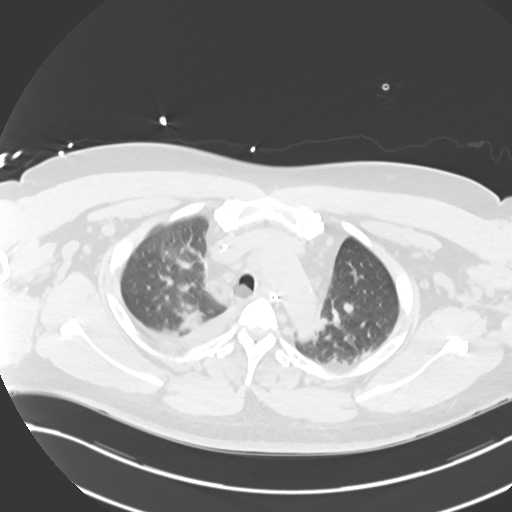
[im 136/161  lung]
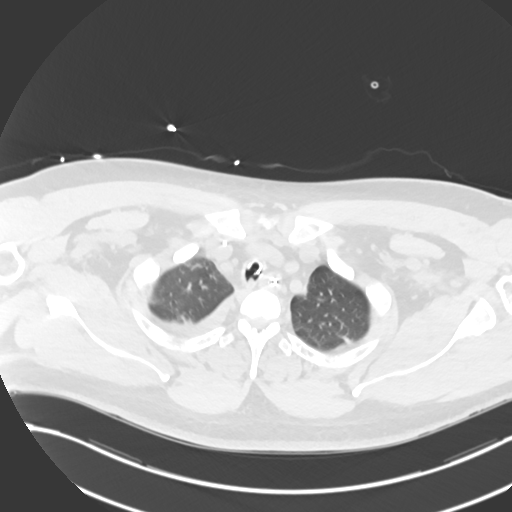
[im 148/161  lung]
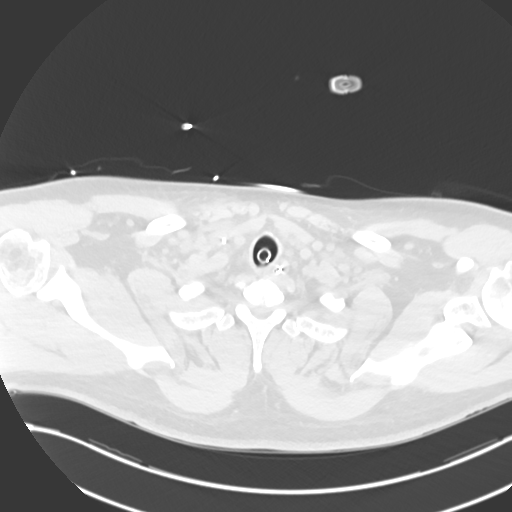

[Series 6: cor · coronal · 0.62mm/px · 3 of 157 slices shown]
[im 32/157  lung]
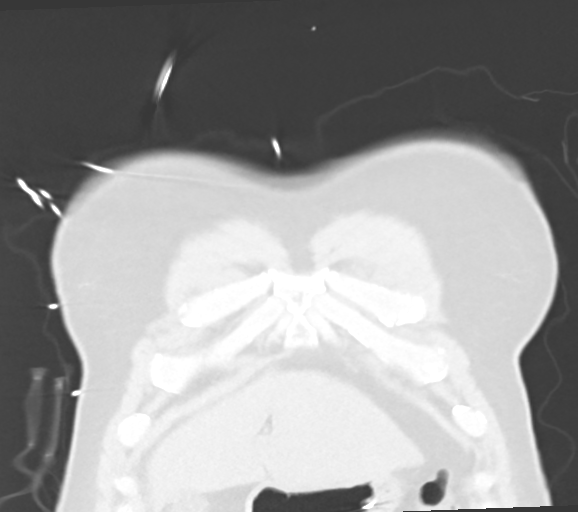
[im 63/157  lung]
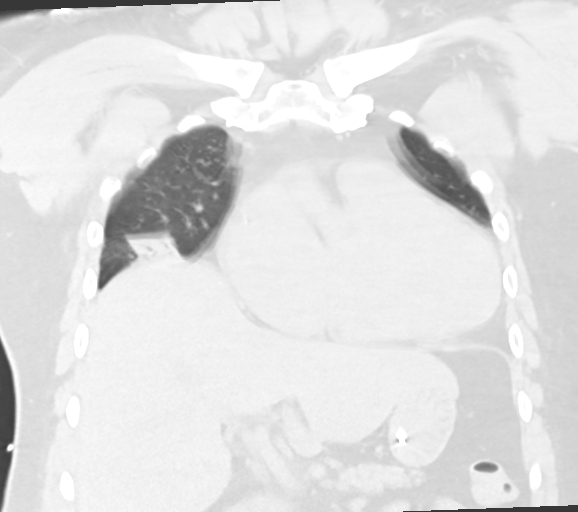
[im 94/157  lung]
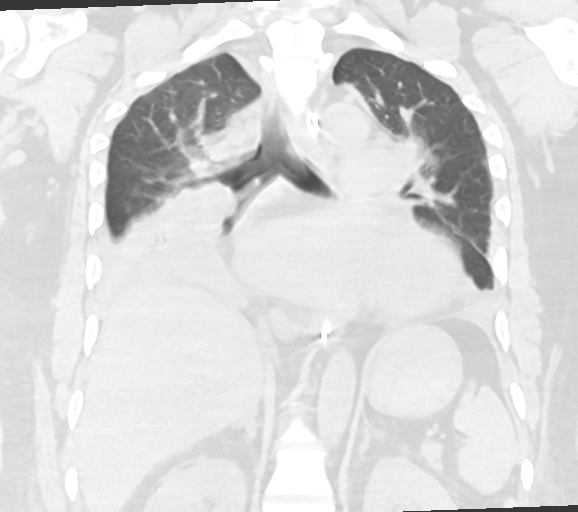

[15 of 36 positions shown; findings below may reference images not displayed]

FINDINGS: Cardiovascular: Right-sided central venous catheter tip terminates
in the superior vena cava. Heart is mildly enlarged. Trace fluid
superior pericardial recess. Thoracic aortic vascular
calcifications.

Mediastinum/Nodes: No axillary adenopathy. Prominent right
paratracheal node measuring 10 mm (image 40; series 3). Enteric tube
terminates in the stomach. Otherwise unremarkable appearance of the
esophagus.

Lungs/Pleura: Central airways are patent. Motion artifact limits
evaluation. Dependent mucus and debris within the distal tracheal
air column. Fairly significant right middle and right lower lobe
consolidation. Consolidation within the left lower lobe. Small
bilateral pleural effusions. No pneumothorax. Patchy ground-glass
opacities within the aerated right upper and right middle lobes.

Upper Abdomen: No acute process.

Musculoskeletal: No aggressive or acute appearing osseous lesions.
IMPRESSION: 1. Fairly significant consolidation within the right middle and
right lower lobes as well as left lower lobe which may represent
atelectasis or infection.
2. Patchy ground-glass opacities within the aerated right upper and
right middle lobes which may represent edema or infection.
3. Mildly prominent mediastinal lymph nodes favored to be reactive
in etiology.
4. Aortic atherosclerosis.

## 2022-05-01 ENCOUNTER — Other Ambulatory Visit (HOSPITAL_COMMUNITY): Payer: Self-pay

## 2022-05-01 IMAGING — DX DG CHEST 1V PORT
1 series · 1 of 1 positions shown · non-contrast
Comparison: 09/21/2020

CLINICAL DATA: Hypoxia

EXAM:
PORTABLE CHEST 1 VIEW

[chest ap]
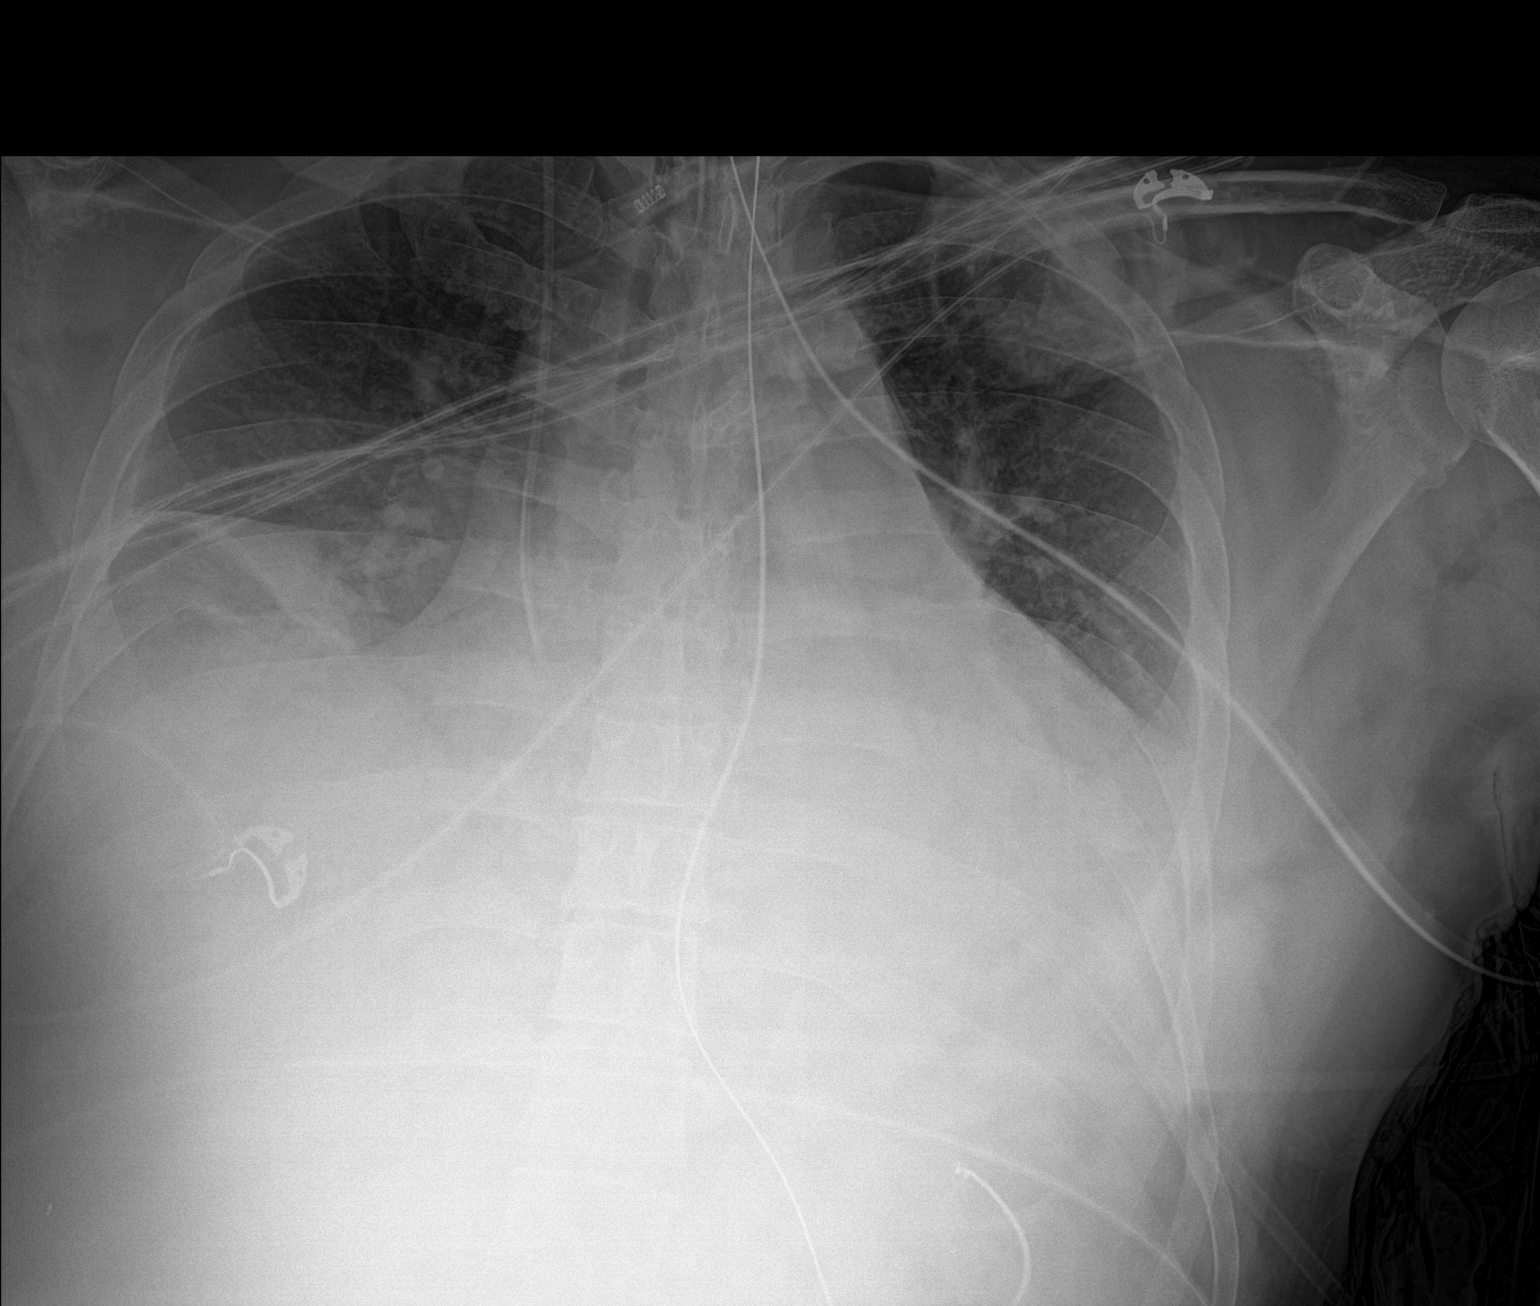

[1 of 1 positions shown; findings below may reference images not displayed]

FINDINGS: The endotracheal tube terminates above the carina. The right-sided
central venous catheter is unchanged. The enteric tube extends below
the left hemidiaphragm. The lung volumes are low. The heart size is
enlarged. There are growing bilateral pleural effusions. There is
persistent bibasilar airspace consolidation. There is no
pneumothorax.
IMPRESSION: 1. Lines and tubes as above.
2. Persistent bibasilar airspace consolidation.
3. Cardiomegaly with growing bilateral pleural effusions.

## 2022-06-22 ENCOUNTER — Other Ambulatory Visit (HOSPITAL_COMMUNITY): Payer: Self-pay

## 2022-08-12 ENCOUNTER — Other Ambulatory Visit (HOSPITAL_COMMUNITY): Payer: Self-pay

## 2022-08-18 ENCOUNTER — Other Ambulatory Visit (HOSPITAL_COMMUNITY): Payer: Self-pay

## 2022-08-18 ENCOUNTER — Telehealth (HOSPITAL_COMMUNITY): Payer: Self-pay

## 2022-08-18 NOTE — Telephone Encounter (Signed)
Patient Advocate Encounter  Received a notice from [employer] that it is time to renew [place] assistance. Left voicemail for patient to call back and start the re-enrollment process.   Burnell Blanks, CPhT Rx Patient Advocate Phone: [phone]

## 2022-10-15 ENCOUNTER — Telehealth (HOSPITAL_COMMUNITY): Payer: Self-pay

## 2022-10-15 NOTE — Telephone Encounter (Signed)
Called and left patient a voice message to confirm/remind patient of their appointment at the [facility] on 10/16/22.   Message was also left for patient to bring all medications and/or complete list.

## 2022-10-16 ENCOUNTER — Encounter (HOSPITAL_COMMUNITY): Payer: Self-pay

## 2022-10-16 ENCOUNTER — Other Ambulatory Visit: Payer: Self-pay

## 2022-10-16 ENCOUNTER — Other Ambulatory Visit (HOSPITAL_COMMUNITY): Payer: Self-pay

## 2022-10-16 ENCOUNTER — Ambulatory Visit (HOSPITAL_COMMUNITY)
Admission: RE | Admit: 2022-10-16 | Discharge: 2022-10-16 | Disposition: A | Discharge status: Home / Self Care | Payer: 59 | Source: Ambulatory Visit | Attending: Family Medicine | Admitting: Family Medicine

## 2022-10-16 VITALS — BP 162/104 | HR 91 | Wt 290.8 lb

## 2022-10-16 DIAGNOSIS — I10 Essential (primary) hypertension: Secondary | ICD-10-CM | POA: Diagnosis not present

## 2022-10-16 DIAGNOSIS — F1010 Alcohol abuse, uncomplicated: Secondary | ICD-10-CM

## 2022-10-16 DIAGNOSIS — I480 Paroxysmal atrial fibrillation: Secondary | ICD-10-CM | POA: Diagnosis not present

## 2022-10-16 DIAGNOSIS — I471 Supraventricular tachycardia, unspecified: Secondary | ICD-10-CM | POA: Diagnosis not present

## 2022-10-16 DIAGNOSIS — I5032 Chronic diastolic (congestive) heart failure: Secondary | ICD-10-CM | POA: Insufficient documentation

## 2022-10-16 DIAGNOSIS — F17200 Nicotine dependence, unspecified, uncomplicated: Secondary | ICD-10-CM

## 2022-10-16 DIAGNOSIS — R0683 Snoring: Secondary | ICD-10-CM

## 2022-10-16 LAB — CBC
HCT: 42.7 % (ref 39.0–52.0)
Hemoglobin: 14.4 g/dL (ref 13.0–17.0)
MCH: 35.1 pg — ABNORMAL HIGH (ref 26.0–34.0)
MCHC: 33.7 g/dL (ref 30.0–36.0)
MCV: 104.1 fL — ABNORMAL HIGH (ref 80.0–100.0)
Platelets: 227 10*3/uL (ref 150–400)
RBC: 4.1 MIL/uL — ABNORMAL LOW (ref 4.22–5.81)
RDW: 12.6 % (ref 11.5–15.5)
WBC: 5 10*3/uL (ref 4.0–10.5)
nRBC: 0 % (ref 0.0–0.2)

## 2022-10-16 LAB — COMPREHENSIVE METABOLIC PANEL
ALT: 36 U/L (ref 0–44)
AST: 39 U/L (ref 15–41)
Albumin: 4 g/dL (ref 3.5–5.0)
Alkaline Phosphatase: 59 U/L (ref 38–126)
Anion gap: 8 (ref 5–15)
BUN: 11 mg/dL (ref 6–20)
CO2: 26 mmol/L (ref 22–32)
Calcium: 8.9 mg/dL (ref 8.9–10.3)
Chloride: 105 mmol/L (ref 98–111)
Creatinine, Ser: 1.02 mg/dL (ref 0.61–1.24)
GFR, Estimated: >60 mL/min (ref >60)
Glucose, Bld: 90 mg/dL (ref 70–99)
Potassium: 4.2 mmol/L (ref 3.5–5.1)
Sodium: 139 mmol/L (ref 135–145)
Total Bilirubin: 0.9 mg/dL (ref 0.3–1.2)
Total Protein: 7.9 g/dL (ref 6.5–8.1)

## 2022-10-16 MED ORDER — SACUBITRIL-VALSARTAN 97-103 MG PO TABS
1.0000 | ORAL_TABLET | Freq: Two times a day (BID) | ORAL | 6 refills | Status: DC
  Filled 2022-10-16: qty 60, 30d supply, fill #0

## 2022-10-16 MED ORDER — SPIRONOLACTONE 25 MG PO TABS
25.0000 mg | ORAL_TABLET | Freq: Every day | ORAL | 6 refills | Status: DC
  Filled 2022-10-16: qty 30, 30d supply, fill #0
  Filled 2022-11-23: qty 30, 30d supply, fill #1
  Filled 2023-01-14: qty 30, 30d supply, fill #2
  Filled 2023-03-04: qty 30, 30d supply, fill #3
  Filled 2023-04-16 – 2023-04-27 (×2): qty 30, 30d supply, fill #4
  Filled 2023-06-04: qty 60, 60d supply, fill #5
  Filled 2023-06-11: qty 30, 30d supply, fill #5
  Filled 2023-09-02: qty 30, 30d supply, fill #6

## 2022-10-16 MED ORDER — BLOOD PRESSURE MONITOR MISC
0 refills | Status: DC
  Filled 2022-10-16: qty 1, 1d supply, fill #0

## 2022-10-16 NOTE — Patient Instructions (Addendum)
EKG done today.  Labs done today. We will contact you only if your labs are abnormal.  Your medications have been refilled.  No other medication changes were made. Please continue all current medications as prescribed.  Your physician has requested that you regularly monitor and record your blood pressure readings at home. Please use the same machine at the same time of day to check your readings and record them to bring to your follow-up visit.  Your provider has recommended that you have a home sleep study.  We have provided you with the equipment in our office today. Please download the app and follow the instructions. YOUR PIN NUMBER IS: [id number]. Once you have completed the test you just dispose of the equipment, the information is automatically uploaded to [place] via blue-tooth technology. If your test is positive for sleep apnea and you need a home CPAP machine you will be contacted by Dr Theodosia Blender office [facility]) to set this up.  Your physician recommends that you schedule a follow-up appointment in: 3-4 weeks with our Pharmacy Clinic and in 6 months with Dr. Haroldine Laws with an echo prior to your exam. Please contact our office in April 2024 to schedule a May 2024 appointment.   If you have any questions or concerns before your next appointment please send [place] a message through [place] or call our office at [phone].    TO LEAVE A MESSAGE FOR THE NURSE SELECT OPTION 2, PLEASE LEAVE A MESSAGE INCLUDING: YOUR NAME DATE OF BIRTH CALL BACK NUMBER REASON FOR CALL**this is important as we prioritize the call backs  YOU WILL RECEIVE A CALL BACK THE SAME DAY AS LONG AS YOU CALL BEFORE 4:00 PM   Do the following things EVERYDAY: Weigh yourself in the morning before breakfast. Write it down and keep it in a log. Take your medicines as prescribed Eat low salt foods--Limit salt (sodium) to 2000 mg per day.  Stay as active as you can everyday Limit all fluids for the day to less than 2  liters   At the [facility], you and your health needs are our priority. As part of our continuing mission to provide you with exceptional heart care, we have created designated Provider Care Teams. These Care Teams include your primary Cardiologist (physician) and Advanced Practice Providers (APPs- Physician Assistants and Nurse Practitioners) who all work together to provide you with the care you need, when you need it.   You may see any of the following providers on your designated Care Team at your next follow up: Dr Glori Bickers Dr Haynes Kerns, NP Lyda Jester, [place] Audry Riles, PharmD   Please be sure to bring in all your medications bottles to every appointment.

## 2022-10-16 NOTE — Progress Notes (Signed)
Advanced Heart Failure Clinic Note   Date:  10/16/2022   ID:  Antonio Tapia, DOB Apr 29, 1981, MRN [id number]  Location: Home  Provider location: [place] [facility] Type of Visit: Established patient  PCP:  Arvilla Market, MD  HF Cardiologist: Bensimhon  Chief Complaint: Heart Failure follow-up   HPI: Antonio Tapia is a 41 y.o. with history of ETOH abuse, A flutter, tobacco abuse, and chronic systolic heart failure.    Admitted 10/21 with ADHF and marked volume overload. On the day of admit he continued to decompensate with SVT.  Underwent Emergent DC-CV and intubation. Placed on Norepi + milrinone+ amio drip. Gradually improved and RHC/LHC on 09/30/20 with normal coronaries, EF 40-45% and well compensated filling pressures. D/c wt was 261 lb.    Last seen in 4/22 and was doing well. Echo with EF 60-65%, mod LVH, Grade II DD.  Today he returns for HF follow up with mother. Overall feeling fine. Denies SOB/PND/Orthopnea. Appetite ok. No fever or chills. Pt not weighing self at home. Taking Entresto intermittently (frequently misses 2nd dose).  Drinking "a couple mixed drinks" 3-4/wk. Lives in apartment. Smoking 4-5 cigs a day while at work. Working full time in [employer] in [employer]. Snores.    Cardiac Studies  - Echo (03/27/21): EF 60-65% mod LVH G2DD   - Echo (10/21): EF 20%, RV moderately reduced   - Echo (2019): EF 30-25%, RV normal  - R/LHC (10/21)   Ao = 123/86 (104) LV = 126/8 RA =  2 RV = 33/8 PA = 32/5 (21) PCW = 4 Fick cardiac output/index = 8.2/3.3 PVR = 2.6 WU FA sat = 99% PA sat = 76%, 77%   Assessment: 1. 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 Well-compensated filling pressures      Past Medical History:  Diagnosis Date   Acute congestive heart failure (HCC)    Alcohol use    Atrial flutter (HCC)    Essential hypertension    Hypertension    Phreesia 10/29/2020   Tobacco use    Past Surgical History:   Procedure Laterality Date   RIGHT/LEFT HEART CATH AND CORONARY ANGIOGRAPHY N/A 09/30/2020   Procedure: RIGHT/LEFT HEART CATH AND CORONARY ANGIOGRAPHY;  Surgeon: Dolores Patty, MD;  [facility];  Service: Cardiovascular;  Laterality: N/A;     Current Outpatient Medications  Medication Sig Dispense Refill   sacubitril-valsartan (ENTRESTO) 97-103 MG Take 1 tablet by mouth 2 (two) times daily. 60 tablet 6   spironolactone (ALDACTONE) 25 MG tablet Take 1 tablet (25 mg total) by mouth daily. 30 tablet 6   No current facility-administered medications for this encounter.    Allergies:   Patient has no known allergies.   Social History:  The patient  reports that he has been smoking. He has never used smokeless tobacco. He reports current alcohol use. He reports that he does not use drugs.   Family History:  The patient's family history includes Breast cancer in his paternal grandfather; Diabetes in his paternal grandmother; Hypertension in his father and mother.   ROS:  Please see the history of present illness.   All other systems are personally reviewed and negative.   Wt Readings from Last 3 Encounters:  10/16/22 131.9 kg (290 lb 12.8 oz)  12/04/21 134.4 kg (296 lb 3.2 oz)  09/04/21 (!) 136.4 kg (300 lb 12.8 oz)   BP (!) 162/104   Pulse 91   Wt  131.9 kg (290 lb 12.8 oz)   SpO2 96%   BMI 36.58 kg/m   Physical Exam:   General:  NAD. No resp difficulty HEENT: Normal Neck: Supple. No JVD. Carotids 2+ bilat; no bruits. No lymphadenopathy or thryomegaly appreciated. Cor: PMI nondisplaced. Regular rate & rhythm. No rubs, gallops or murmurs. Lungs: Clear Abdomen: Soft, nontender, nondistended. No hepatosplenomegaly. No bruits or masses. Good bowel sounds. Extremities: No cyanosis, clubbing, rash, edema Neuro: Alert & oriented x 3, cranial nerves grossly intact. Moves all 4 extremities w/o difficulty. Affect pleasant.  ECG: SR 81 bpm (personally  reviewed)  Recent Labs: 12/04/2021: BUN 14; Creatinine, Ser 1.00; Potassium 3.9; Sodium 136  Personally reviewed     ASSESSMENT AND PLAN: 1. 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  - Admit ADHF 10/21 c/b SVT leading to cardiogenic shock  - Echo (10/21): EF 20%, RV moderately reduced  - R/LHC showed normal coronaries, EF 40-45%, and compensated filling pressures. CO/CI (Fick) 8.2/3.3.  - Echo (4/22): EF 60-65 mod LVH G2DD  - NYHA I. Volume status stable.  - Continue Entresto 97-103 mg bid. Needs to take BID - Continue spiro 25 mg daily  - Failed SGLT2i in past. - Add Toprol next. - Labs today. - Repeat echo in 6 months to ensure EF stability.   2. SVT - Admit 10/21 w/ hemodynamically unstable SVT requiring emergent DCCV  - he has been off amio.  - NSR on ECG today.  No change  3. PAF - In NSR today.  - Has not recurred. Off amio - At risk for recurrence. - CHADS-VASc: 2, previously recommended starting AC but patient deferred.  4. HTN  - Not controlled. - Discussed taking GDMT as directed. - Given Rx for BP cuff, check daily and log. - Arrange for home sleep study. - Would add Toprol next if BP not controlled (I do not think he would be agreeable to tid dosing with BiDil)  5. ETOH Abuse - Has cut back  - Discussed cessation.   6. Tobacco Abuse  - Still smoking. - Discussed cessation.   7. SDOH needs - Now has an apartment, insurance and working full time. - Needs assistance with medication paperwork.  8. Snoring - Arrange home sleep study.  Follow up in 3-4 weeks with PharmD (BP check) and 6 months with Dr. Haroldine Laws + echo.  Cassandria Santee, FNP-BC 10/16/2022 11:31 AM  Advanced Heart Failure [place] [address] [phone] (office) [phone]  (fax)

## 2022-10-16 NOTE — Addendum Note (Signed)
Encounter addended by: Shonna Chock, CMA on: 88/04/276 3:02 PM  Actions taken: Charge Capture section accepted

## 2022-10-19 ENCOUNTER — Other Ambulatory Visit: Payer: Self-pay

## 2022-11-03 ENCOUNTER — Other Ambulatory Visit (HOSPITAL_COMMUNITY): Payer: Self-pay

## 2022-11-03 NOTE — Progress Notes (Incomplete)
***In Progress***    Advanced Heart Failure Clinic Note   PCP:  Nicolette Bang, MD           HF Cardiologist: Bensimhon  HPI:  Mr Cannoy is a 41 y.o. with history of ETOH abuse, A flutter, tobacco abuse, and chronic systolic heart failure.    Admitted 09/2020 with ADHF and marked volume overload. On the day of admit he continued to decompensate with SVT.  Underwent Emergent DC-CV and intubation. Placed on Norepi + milrinone+ amio drip. Gradually improved and RHC/LHC on 09/30/20 with normal coronaries, EF 40-45% and well compensated filling pressures. D/c weight was 261 lb.    Last seen in 03/2021 and was doing well. Echo with EF 60-65%, mod LVH, Grade II DD.   Returned to [facility] for HF follow up with mother on 10/16/22. Overall was feeling fine. Denied SOB/PND/Orthopnea. Appetite was ok. No fever or chills. Pt was not weighing himself at home. Reported taking Entresto intermittently (frequently misses 2nd dose).  Reported drinking "a couple mixed drinks" 3-4/wk. Lives in apartment. Smoking 4-5 cigs a day while at work. Working full time in [employer] in [employer]. Snores.     Today he returns to HF clinic for pharmacist medication titration. At last visit with APP, ***.   Shortness of breath/dyspnea on exertion? {YES NO:22349}  Orthopnea/PND? {YES NO:22349} Edema? {YES NO:22349} Lightheadedness/dizziness? {YES NO:22349} Daily weights at home? {YES NO:22349} Blood pressure/heart rate monitoring at home? {YES [id number] Following low-sodium/fluid-restricted diet? {YES NO:22349}  HF Medications:   Has the patient been experiencing any side effects to the medications prescribed?  {YES NO:22349}  Does the patient have any problems obtaining medications due to transportation or finances?   {YES NO:22349}  Understanding of regimen: {excellent/good/fair/poor:19665} Understanding of indications: {excellent/good/fair/poor:19665} Potential of compliance:  {excellent/good/fair/poor:19665} Patient understands to avoid NSAIDs. Patient understands to avoid decongestants.    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  - Admit ADHF 09/2020 c/b SVT leading to cardiogenic shock  - Echo (09/2020): EF 20%, RV moderately reduced  - R/LHC showed normal coronaries, EF 40-45%, and compensated filling pressures. CO/CI (Fick) 8.2/3.3.  - Echo (03/2021): EF 60-65 mod LVH G2DD  - NYHA I. Volume status stable.  - Continue Entresto 97-103 mg BID. Needs to take BID *** - Continue spironolactone 25 mg daily  - Failed SGLT2i in past. - Repeat echo in 6 months to ensure EF stability.   2. SVT - Admit 09/2020 w/ hemodynamically unstable SVT requiring emergent DCCV  - he has been off amiodarone.  - NSR on ECG 10/16/22.  No change   3. PAF - NSR on ECG 10/16/22 - Has not recurred. Off amiodarone - At risk for recurrence. - CHADS-VASc: 2, previously recommended starting AC but patient deferred.   4. HTN  - Not controlled. - Discussed taking GDMT as directed. - Given Rx for BP cuff, check daily and log. - Arrange for home sleep study. - Would add Toprol next if BP not controlled (I do not think he would be agreeable to tid dosing with BiDil) ***   5. ETOH Abuse - Has cut back  - Discussed cessation.  6. Tobacco Abuse  - Still smoking. - Discussed cessation.    7. SDOH needs - Now has an apartment, insurance and working full time. - Needs assistance with medication paperwork.   8. Snoring - Arrange home sleep study.  Follow up ***   Karle Plumber, PharmD, BCPS, BCCP, CPP Heart Failure Clinic  Pharmacist [phone]

## 2022-11-12 ENCOUNTER — Encounter: Payer: Self-pay | Admitting: Family Medicine

## 2022-11-12 ENCOUNTER — Ambulatory Visit (INDEPENDENT_AMBULATORY_CARE_PROVIDER_SITE_OTHER): Payer: 59 | Admitting: Family Medicine

## 2022-11-12 VITALS — BP 149/97 | HR 90 | Temp 98.1°F | Resp 16 | Ht 72.0 in | Wt 283.4 lb

## 2022-11-12 DIAGNOSIS — I10 Essential (primary) hypertension: Secondary | ICD-10-CM

## 2022-11-12 DIAGNOSIS — F17210 Nicotine dependence, cigarettes, uncomplicated: Secondary | ICD-10-CM | POA: Diagnosis not present

## 2022-11-12 DIAGNOSIS — F419 Anxiety disorder, unspecified: Secondary | ICD-10-CM

## 2022-11-12 DIAGNOSIS — F32A Depression, unspecified: Secondary | ICD-10-CM | POA: Diagnosis not present

## 2022-11-12 DIAGNOSIS — F17200 Nicotine dependence, unspecified, uncomplicated: Secondary | ICD-10-CM

## 2022-11-12 NOTE — Progress Notes (Signed)
Patient said he has been out of his medication  for BP a few weeks. Patient is former patient of Dr. Earlene Plater.

## 2022-11-16 ENCOUNTER — Inpatient Hospital Stay (HOSPITAL_COMMUNITY): Admission: RE | Admit: 2022-11-16 | Payer: 59 | Source: Ambulatory Visit

## 2022-11-16 ENCOUNTER — Telehealth (HOSPITAL_COMMUNITY): Payer: Self-pay | Admitting: Pharmacist

## 2022-11-16 ENCOUNTER — Other Ambulatory Visit (HOSPITAL_COMMUNITY): Payer: Self-pay

## 2022-11-16 NOTE — Progress Notes (Signed)
Established Patient Office Visit  Subjective    Patient ID: Antonio Tapia, male    DOB: 25-Nov-1981  Age: 41 y.o. MRN: [id number]  CC:  Chief Complaint  Patient presents with   Follow-up    HPI Antonio Tapia presents to establish care with a new provider. Patient denies acute complaints.    Outpatient Encounter Medications as of 11/12/2022  Medication Sig   Blood Pressure Monitor MISC Use as Directed to check Blood Pressure   sacubitril-valsartan (ENTRESTO) 97-103 MG Take 1 tablet by mouth 2 (two) times daily.   spironolactone (ALDACTONE) 25 MG tablet Take 1 tablet (25 mg total) by mouth daily.   No facility-administered encounter medications on file as of 11/12/2022.    Past Medical History:  Diagnosis Date   Acute congestive heart failure (HCC)    Alcohol use    Atrial flutter (HCC)    Essential hypertension    Hypertension    Phreesia 10/29/2020   Tobacco use     Past Surgical History:  Procedure Laterality Date   RIGHT/LEFT HEART CATH AND CORONARY ANGIOGRAPHY N/A 09/30/2020   Procedure: RIGHT/LEFT HEART CATH AND CORONARY ANGIOGRAPHY;  Surgeon: Dolores Patty, MD;  Location: [facility] INVASIVE CV LAB;  Service: Cardiovascular;  Laterality: N/A;    Family History  Problem Relation Age of Onset   Hypertension Mother    Hypertension Father    Diabetes Paternal Grandmother    Breast cancer Paternal Grandfather     Social History   Socioeconomic History   Marital status: Single    Spouse name: Not on file   Number of children: Not on file   Years of education: Not on file   Highest education level: Not on file  Occupational History   Not on file  Tobacco Use   Smoking status: Every Day   Smokeless tobacco: Never  Substance and Sexual Activity   Alcohol use: Yes    Comment: 1 pint liquor/day   Drug use: No   Sexual activity: Not on file  Other Topics Concern   Not on file  Social History Narrative   Not on file   Social Determinants of  Health   Financial Resource Strain: Low Risk  (09/17/2020)   Overall Financial Resource Strain (CARDIA)    Difficulty of Paying Living Expenses: Not very hard  Food Insecurity: No Food Insecurity (09/05/2021)   Hunger Vital Sign    Worried About Running Out of Food in the Last Year: Never true    Ran Out of Food in the Last Year: Never true  Transportation Needs: No Transportation Needs (09/05/2021)   PRAPARE - Administrator, Civil Service (Medical): No    Lack of Transportation (Non-Medical): No  Physical Activity: Not on file  Stress: Not on file  Social Connections: Not on file  Intimate Partner Violence: Not on file    Review of Systems  All other systems reviewed and are negative.       Objective    BP (!) 149/97   Pulse 90   Temp 98.1 F (36.7 C) (Oral)   Resp 16   Ht 6' (1.829 m)   Wt 283 lb 6.4 oz (128.5 kg)   SpO2 95%   BMI 38.44 kg/m   Physical Exam Vitals and nursing note reviewed.  Constitutional:      General: He is not in acute distress. Cardiovascular:     Rate and Rhythm: Normal rate and regular rhythm.  Pulmonary:  Effort: Pulmonary effort is normal.     Breath sounds: Normal breath sounds.  Abdominal:     Palpations: Abdomen is soft.     Tenderness: There is no abdominal tenderness.  Musculoskeletal:     Right lower leg: No edema.     Left lower leg: No edema.  Neurological:     General: No focal deficit present.     Mental Status: He is alert and oriented to person, place, and time.         Assessment & Plan:   1. Essential hypertension Patient with slightly elevated reading. Patient is under care of specialist. continue  2. Anxiety and depression Patient reports stable with no meds. Defers further eval/mgt at this time  3. Smoking Discussed cessation   Return in about 4 months (around 03/13/2023) for follow up.   Tommie Raymond, MD

## 2022-11-16 NOTE — Telephone Encounter (Signed)
Advanced Heart Failure Patient Advocate Encounter  Prior Authorization for Sherryll Burger has been approved.    PA# [id number] Effective dates: 11/16/22 through 11/17/23  Karle Plumber, PharmD, BCPS, BCCP, CPP Heart Failure Clinic Pharmacist [phone]

## 2022-11-23 ENCOUNTER — Other Ambulatory Visit: Payer: Self-pay

## 2022-11-24 ENCOUNTER — Other Ambulatory Visit: Payer: Self-pay

## 2023-01-15 ENCOUNTER — Other Ambulatory Visit (HOSPITAL_COMMUNITY): Payer: Self-pay

## 2023-01-15 ENCOUNTER — Other Ambulatory Visit: Payer: Self-pay

## 2023-02-23 ENCOUNTER — Telehealth: Payer: Self-pay

## 2023-02-23 NOTE — Telephone Encounter (Signed)
Patient attempted to be outreached by Georga Kaufmann, PharmD Candidate on 02/23/23 to discuss hypertension. Left voicemail for patient to return our call at their convenience at [phone].   Georga Kaufmann, PharmD Candidate   Maryan Puls, PharmD PGY-1 [facility] Resident

## 2023-03-09 ENCOUNTER — Ambulatory Visit: Payer: 59 | Admitting: Family Medicine

## 2023-03-16 NOTE — Telephone Encounter (Signed)
Patient attempted to be outreached by Georga Kaufmann, PharmD Candidate on 03/16/23 to discuss hypertension. Left voicemail for patient to return our call at their convenience at [phone].   Georga Kaufmann, PharmD Candidate   Catie Hedwig Morton, PharmD, [place], [facility] [phone]

## 2023-04-22 ENCOUNTER — Other Ambulatory Visit: Payer: Self-pay

## 2023-04-27 ENCOUNTER — Other Ambulatory Visit: Payer: Self-pay

## 2023-05-16 ENCOUNTER — Ambulatory Visit (HOSPITAL_COMMUNITY): Admission: EM | Admit: 2023-05-16 | Discharge: 2023-05-16 | Discharge status: Against Medical Advice | Payer: 59

## 2023-05-16 NOTE — ED Notes (Signed)
Darin Engels, patient access reports patient did not expect co pay.  Patient access reports he assured patient he could be seen regardless of ability  to pay.  Patient left

## 2023-06-04 ENCOUNTER — Other Ambulatory Visit: Payer: Self-pay

## 2023-06-11 ENCOUNTER — Other Ambulatory Visit: Payer: Self-pay

## 2023-08-27 ENCOUNTER — Other Ambulatory Visit: Payer: Self-pay

## 2023-08-27 NOTE — Progress Notes (Signed)
Patient attempted to be outreached by Sofie Rower, PharmD to discuss hypertension. Left voicemail for patient to return our call at their convenience at [phone]  Sofie Rower, PharmD [facility] PGY1

## 2023-09-03 ENCOUNTER — Other Ambulatory Visit: Payer: Self-pay

## 2023-09-13 ENCOUNTER — Other Ambulatory Visit: Payer: Self-pay

## 2023-11-03 ENCOUNTER — Other Ambulatory Visit (HOSPITAL_COMMUNITY): Payer: Self-pay | Admitting: Family Medicine

## 2023-11-04 ENCOUNTER — Other Ambulatory Visit: Payer: Self-pay

## 2023-11-04 MED ORDER — SPIRONOLACTONE 25 MG PO TABS
25.0000 mg | ORAL_TABLET | Freq: Every day | ORAL | 1 refills | Status: DC
  Filled 2023-11-04: qty 30, 30d supply, fill #0

## 2023-11-05 ENCOUNTER — Other Ambulatory Visit: Payer: Self-pay

## 2024-03-09 ENCOUNTER — Emergency Department (HOSPITAL_COMMUNITY): Payer: Self-pay

## 2024-03-09 ENCOUNTER — Other Ambulatory Visit: Payer: Self-pay

## 2024-03-09 ENCOUNTER — Inpatient Hospital Stay (HOSPITAL_COMMUNITY)
Admission: EM | Admit: 2024-03-09 | Discharge: 2024-03-11 | DRG: 308 | Disposition: A | Discharge status: Home / Self Care | Payer: Self-pay | Attending: Internal Medicine | Admitting: Internal Medicine

## 2024-03-09 ENCOUNTER — Encounter (HOSPITAL_COMMUNITY): Payer: Self-pay | Admitting: Emergency Medicine

## 2024-03-09 DIAGNOSIS — I517 Cardiomegaly: Secondary | ICD-10-CM | POA: Diagnosis not present

## 2024-03-09 DIAGNOSIS — Z803 Family history of malignant neoplasm of breast: Secondary | ICD-10-CM | POA: Diagnosis not present

## 2024-03-09 DIAGNOSIS — Z8249 Family history of ischemic heart disease and other diseases of the circulatory system: Secondary | ICD-10-CM

## 2024-03-09 DIAGNOSIS — R062 Wheezing: Secondary | ICD-10-CM | POA: Diagnosis present

## 2024-03-09 DIAGNOSIS — Z7901 Long term (current) use of anticoagulants: Secondary | ICD-10-CM | POA: Diagnosis not present

## 2024-03-09 DIAGNOSIS — I5021 Acute systolic (congestive) heart failure: Secondary | ICD-10-CM | POA: Diagnosis not present

## 2024-03-09 DIAGNOSIS — I4719 Other supraventricular tachycardia: Secondary | ICD-10-CM | POA: Diagnosis present

## 2024-03-09 DIAGNOSIS — I4891 Unspecified atrial fibrillation: Secondary | ICD-10-CM | POA: Diagnosis not present

## 2024-03-09 DIAGNOSIS — R61 Generalized hyperhidrosis: Secondary | ICD-10-CM | POA: Diagnosis present

## 2024-03-09 DIAGNOSIS — I110 Hypertensive heart disease with heart failure: Secondary | ICD-10-CM | POA: Diagnosis present

## 2024-03-09 DIAGNOSIS — G4733 Obstructive sleep apnea (adult) (pediatric): Secondary | ICD-10-CM | POA: Diagnosis not present

## 2024-03-09 DIAGNOSIS — R0602 Shortness of breath: Secondary | ICD-10-CM

## 2024-03-09 DIAGNOSIS — I483 Typical atrial flutter: Principal | ICD-10-CM | POA: Diagnosis present

## 2024-03-09 DIAGNOSIS — E669 Obesity, unspecified: Secondary | ICD-10-CM | POA: Diagnosis present

## 2024-03-09 DIAGNOSIS — Z91148 Patient's other noncompliance with medication regimen for other reason: Secondary | ICD-10-CM | POA: Diagnosis not present

## 2024-03-09 DIAGNOSIS — I480 Paroxysmal atrial fibrillation: Secondary | ICD-10-CM | POA: Diagnosis present

## 2024-03-09 DIAGNOSIS — Z5971 Insufficient health insurance coverage: Secondary | ICD-10-CM

## 2024-03-09 DIAGNOSIS — I428 Other cardiomyopathies: Secondary | ICD-10-CM | POA: Diagnosis present

## 2024-03-09 DIAGNOSIS — Z833 Family history of diabetes mellitus: Secondary | ICD-10-CM | POA: Diagnosis not present

## 2024-03-09 DIAGNOSIS — Z7902 Long term (current) use of antithrombotics/antiplatelets: Secondary | ICD-10-CM | POA: Diagnosis not present

## 2024-03-09 DIAGNOSIS — N179 Acute kidney failure, unspecified: Secondary | ICD-10-CM

## 2024-03-09 DIAGNOSIS — I5043 Acute on chronic combined systolic (congestive) and diastolic (congestive) heart failure: Secondary | ICD-10-CM | POA: Diagnosis present

## 2024-03-09 DIAGNOSIS — R002 Palpitations: Secondary | ICD-10-CM | POA: Diagnosis not present

## 2024-03-09 DIAGNOSIS — Z79899 Other long term (current) drug therapy: Secondary | ICD-10-CM | POA: Diagnosis not present

## 2024-03-09 DIAGNOSIS — Z7984 Long term (current) use of oral hypoglycemic drugs: Secondary | ICD-10-CM | POA: Diagnosis not present

## 2024-03-09 DIAGNOSIS — I471 Supraventricular tachycardia, unspecified: Principal | ICD-10-CM

## 2024-03-09 DIAGNOSIS — Z6837 Body mass index (BMI) 37.0-37.9, adult: Secondary | ICD-10-CM

## 2024-03-09 DIAGNOSIS — R0683 Snoring: Secondary | ICD-10-CM | POA: Diagnosis present

## 2024-03-09 DIAGNOSIS — F17210 Nicotine dependence, cigarettes, uncomplicated: Secondary | ICD-10-CM | POA: Diagnosis present

## 2024-03-09 DIAGNOSIS — F1010 Alcohol abuse, uncomplicated: Secondary | ICD-10-CM | POA: Diagnosis present

## 2024-03-09 DIAGNOSIS — R0989 Other specified symptoms and signs involving the circulatory and respiratory systems: Secondary | ICD-10-CM | POA: Diagnosis not present

## 2024-03-09 LAB — TSH: TSH: 3.001 u[IU]/mL (ref 0.350–4.500)

## 2024-03-09 LAB — RESPIRATORY PANEL BY PCR

## 2024-03-09 LAB — RAPID URINE DRUG SCREEN, HOSP PERFORMED
Amphetamines: NOT DETECTED
Barbiturates: NOT DETECTED
Benzodiazepines: NOT DETECTED
Cocaine: NOT DETECTED
Opiates: NOT DETECTED
Tetrahydrocannabinol: POSITIVE — AB

## 2024-03-09 LAB — CBC
HCT: 46.4 % (ref 39.0–52.0)
Hemoglobin: 15.5 g/dL (ref 13.0–17.0)
MCH: 34.5 pg — ABNORMAL HIGH (ref 26.0–34.0)
MCHC: 33.4 g/dL (ref 30.0–36.0)
MCV: 103.3 fL — ABNORMAL HIGH (ref 80.0–100.0)
Platelets: 234 10*3/uL (ref 150–400)
RBC: 4.49 MIL/uL (ref 4.22–5.81)
RDW: 13 % (ref 11.5–15.5)
WBC: 6.8 10*3/uL (ref 4.0–10.5)
nRBC: 0 % (ref 0.0–0.2)

## 2024-03-09 LAB — CBG MONITORING, ED: Glucose-Capillary: 98 mg/dL (ref 70–99)

## 2024-03-09 LAB — BASIC METABOLIC PANEL WITH GFR
Anion gap: 11 (ref 5–15)
BUN: 23 mg/dL — ABNORMAL HIGH (ref 6–20)
CO2: 24 mmol/L (ref 22–32)
Calcium: 9.1 mg/dL (ref 8.9–10.3)
Chloride: 101 mmol/L (ref 98–111)
Creatinine, Ser: 1.57 mg/dL — ABNORMAL HIGH (ref 0.61–1.24)
GFR, Estimated: 56 mL/min — ABNORMAL LOW (ref >60)
Glucose, Bld: 105 mg/dL — ABNORMAL HIGH (ref 70–99)
Potassium: 3.8 mmol/L (ref 3.5–5.1)
Sodium: 136 mmol/L (ref 135–145)

## 2024-03-09 LAB — TROPONIN I (HIGH SENSITIVITY)
Troponin I (High Sensitivity): 55 ng/L — ABNORMAL HIGH (ref <18)
Troponin I (High Sensitivity): 43 ng/L — ABNORMAL HIGH (ref <18)

## 2024-03-09 LAB — ETHANOL: Alcohol, Ethyl (B): <10 mg/dL (ref <10)

## 2024-03-09 LAB — MAGNESIUM: Magnesium: 2 mg/dL (ref 1.7–2.4)

## 2024-03-09 LAB — BRAIN NATRIURETIC PEPTIDE: B Natriuretic Peptide: 858.1 pg/mL — ABNORMAL HIGH (ref 0.0–100.0)

## 2024-03-09 MED ORDER — THIAMINE MONONITRATE 100 MG PO TABS
100.0000 mg | ORAL_TABLET | Freq: Every day | ORAL | Status: DC
Start: 2024-03-09 — End: 2024-03-11
  Administered 2024-03-10 – 2024-03-11 (×2): 100 mg via ORAL
  Filled 2024-03-09 (×2): qty 1

## 2024-03-09 MED ORDER — ISOSORBIDE MONONITRATE ER 60 MG PO TB24
60.0000 mg | ORAL_TABLET | Freq: Every day | ORAL | Status: DC
  Administered 2024-03-09: 60 mg via ORAL
  Filled 2024-03-09: qty 2

## 2024-03-09 MED ORDER — ACETAMINOPHEN 325 MG PO TABS
650.0000 mg | ORAL_TABLET | Freq: Four times a day (QID) | ORAL | Status: DC | PRN
  Administered 2024-03-10: 650 mg via ORAL
  Filled 2024-03-09: qty 2

## 2024-03-09 MED ORDER — ACETAMINOPHEN 650 MG RE SUPP: 650.0000 mg | Freq: Four times a day (QID) | RECTAL | Status: DC | PRN

## 2024-03-09 MED ORDER — LORAZEPAM 1 MG PO TABS: 0.0000 mg | ORAL_TABLET | Freq: Four times a day (QID) | ORAL | Status: AC

## 2024-03-09 MED ORDER — ENOXAPARIN SODIUM 120 MG/0.8ML IJ SOSY
120.0000 mg | PREFILLED_SYRINGE | Freq: Once | INTRAMUSCULAR | Status: DC
  Filled 2024-03-09: qty 0.8

## 2024-03-09 MED ORDER — ALBUTEROL SULFATE (2.5 MG/3ML) 0.083% IN NEBU: 2.5000 mg | INHALATION_SOLUTION | Freq: Four times a day (QID) | RESPIRATORY_TRACT | Status: DC | PRN

## 2024-03-09 MED ORDER — ADENOSINE 6 MG/2ML IV SOLN: 12.0000 mg | Freq: Once | INTRAVENOUS | Status: AC

## 2024-03-09 MED ORDER — HYDROCORTISONE 1 % EX CREA: 1.0000 | TOPICAL_CREAM | Freq: Three times a day (TID) | CUTANEOUS | Status: DC | PRN

## 2024-03-09 MED ORDER — APIXABAN 5 MG PO TABS
5.0000 mg | ORAL_TABLET | Freq: Two times a day (BID) | ORAL | Status: DC
  Administered 2024-03-09 – 2024-03-10 (×3): 5 mg via ORAL
  Filled 2024-03-09 (×3): qty 1

## 2024-03-09 MED ORDER — LORAZEPAM 1 MG PO TABS: 0.0000 mg | ORAL_TABLET | Freq: Two times a day (BID) | ORAL | Status: DC

## 2024-03-09 MED ORDER — LORAZEPAM 2 MG/ML IJ SOLN: 0.0000 mg | Freq: Four times a day (QID) | INTRAMUSCULAR | Status: AC

## 2024-03-09 MED ORDER — BISACODYL 5 MG PO TBEC: 5.0000 mg | DELAYED_RELEASE_TABLET | Freq: Every day | ORAL | Status: DC | PRN

## 2024-03-09 MED ORDER — SODIUM CHLORIDE 0.9% FLUSH: 3.0000 mL | INTRAVENOUS | Status: DC | PRN

## 2024-03-09 MED ORDER — METOPROLOL TARTRATE 5 MG/5ML IV SOLN
5.0000 mg | INTRAVENOUS | Status: AC | PRN
Start: 2024-03-09 — End: 2024-03-09
  Administered 2024-03-09 (×2): 5 mg via INTRAVENOUS
  Filled 2024-03-09 (×2): qty 5

## 2024-03-09 MED ORDER — ADENOSINE 6 MG/2ML IV SOLN
INTRAVENOUS | Status: AC
  Administered 2024-03-09: 12 mg via INTRAVENOUS
  Filled 2024-03-09: qty 4

## 2024-03-09 MED ORDER — SODIUM CHLORIDE 0.9% FLUSH
3.0000 mL | Freq: Two times a day (BID) | INTRAVENOUS | Status: DC
  Administered 2024-03-09 – 2024-03-10 (×3): 10 mL via INTRAVENOUS

## 2024-03-09 MED ORDER — HYDRALAZINE HCL 20 MG/ML IJ SOLN: 10.0000 mg | Freq: Four times a day (QID) | INTRAMUSCULAR | Status: DC | PRN

## 2024-03-09 MED ORDER — LORAZEPAM 2 MG/ML IJ SOLN: 0.0000 mg | Freq: Two times a day (BID) | INTRAMUSCULAR | Status: DC

## 2024-03-09 MED ORDER — POLYETHYLENE GLYCOL 3350 17 G PO PACK: 17.0000 g | PACK | Freq: Every day | ORAL | Status: DC | PRN

## 2024-03-09 MED ORDER — HYDRALAZINE HCL 50 MG PO TABS
100.0000 mg | ORAL_TABLET | Freq: Three times a day (TID) | ORAL | Status: DC
  Administered 2024-03-09 (×3): 100 mg via ORAL
  Filled 2024-03-09: qty 2
  Filled 2024-03-09: qty 4
  Filled 2024-03-09: qty 2

## 2024-03-09 MED ORDER — METOPROLOL TARTRATE 5 MG/5ML IV SOLN
5.0000 mg | Freq: Once | INTRAVENOUS | Status: AC
Start: 2024-03-09 — End: 2024-03-09
  Administered 2024-03-09: 5 mg via INTRAVENOUS
  Filled 2024-03-09: qty 5

## 2024-03-09 MED ORDER — NICOTINE 21 MG/24HR TD PT24
21.0000 mg | MEDICATED_PATCH | Freq: Every day | TRANSDERMAL | Status: DC
  Administered 2024-03-09 – 2024-03-11 (×3): 21 mg via TRANSDERMAL
  Filled 2024-03-09 (×3): qty 1

## 2024-03-09 MED ORDER — ADENOSINE 6 MG/2ML IV SOLN
6.0000 mg | Freq: Once | INTRAVENOUS | Status: AC
Start: 2024-03-09 — End: 2024-03-09
  Administered 2024-03-09: 6 mg via INTRAVENOUS
  Filled 2024-03-09: qty 2

## 2024-03-09 MED ORDER — THIAMINE HCL 100 MG/ML IJ SOLN
100.0000 mg | Freq: Every day | INTRAMUSCULAR | Status: DC
Start: 2024-03-09 — End: 2024-03-11

## 2024-03-09 MED ORDER — METOPROLOL TARTRATE 25 MG PO TABS
25.0000 mg | ORAL_TABLET | Freq: Four times a day (QID) | ORAL | Status: DC
  Administered 2024-03-09 – 2024-03-10 (×4): 25 mg via ORAL
  Filled 2024-03-09 (×4): qty 1

## 2024-03-09 NOTE — ED Provider Notes (Signed)
 [place] EMERGENCY DEPARTMENT AT [facility] Provider Note   CSN: [id number] Arrival date & time: 03/09/24  1191     History  Chief Complaint  Patient presents with   Palpitations    Antonio Tapia is a 43 y.o. male.  HPI    43 year old male comes in with chief complaint of palpitations. Patient has history of CHF, ejection fraction has improved over time, last echo revealed preserved EF.  Patient also has history of PSVT, paroxysmal A-fib, alcohol use disorder.  Patient indicates that has been having palpitations, shortness of breath over the last few days.  However since last night, his symptoms have been fairly constant.  He is noted to be sweating, and he states that he has had episodes of diaphoresis over the last few days.  Patient denies any chest pain.  He is on Entresto, admits to being compliant.  He denies any substance use disorder.  Last alcoholic beverage was 4 days ago.  He drinks about 4 times a week, usually will have 4 beers.  Home Medications Prior to Admission medications   Medication Sig Start Date End Date Taking? Authorizing Provider  acetaminophen (TYLENOL) 500 MG tablet Take 1,500 mg by mouth every 6 (six) hours as needed for mild pain (pain score 1-3) or moderate pain (pain score 4-6).   Yes [provider]  Multiple Vitamin (MULTIVITAMIN WITH MINERALS) TABS tablet Take 1 tablet by mouth daily.   Yes [provider]  sacubitril-valsartan (ENTRESTO) 97-103 MG Take 1 tablet by mouth 2 (two) times daily. 10/16/22  Yes [place], Anderson Malta, FNP  spironolactone (ALDACTONE) 25 MG tablet Take 1 tablet (25 mg total) by mouth daily. NEEDS FOLLOW UP APPOINTMENT FOR MORE REFILLS 11/04/23  Yes [place], Anderson Malta, FNP      Allergies    Patient has no known allergies.    Review of Systems   Review of Systems  All other systems reviewed and are negative.   Physical Exam Updated Vital Signs BP (!) 138/92   Pulse (!) 116   Temp 98.2  F (36.8 C)   Resp 16   Ht 6' (1.829 m)   Wt 122.5 kg   SpO2 94%   BMI 36.62 kg/m  Physical Exam Vitals and nursing note reviewed.  Constitutional:      General: He is not in acute distress.    Appearance: He is well-developed. He is ill-appearing and diaphoretic.  HENT:     Head: Atraumatic.  Cardiovascular:     Rate and Rhythm: Tachycardia present.  Pulmonary:     Comments: Tachypnea noted Musculoskeletal:     Cervical back: Neck supple.     Right lower leg: No edema.     Left lower leg: No edema.  Skin:    General: Skin is warm.  Neurological:     Mental Status: He is alert and oriented to person, place, and time.     ED Results / Procedures / Treatments   Labs (all labs ordered are listed, but only abnormal results are displayed) Labs Reviewed  BASIC METABOLIC PANEL - Abnormal; Notable for the following components:      Result Value   Glucose, Bld 105 (*)    BUN 23 (*)    Creatinine, Ser 1.57 (*)    GFR, Estimated 56 (*)    All other components within normal limits  CBC - Abnormal; Notable for the following components:   MCV 103.3 (*)    MCH 34.5 (*)  All other components within normal limits  BRAIN NATRIURETIC PEPTIDE - Abnormal; Notable for the following components:   B Natriuretic Peptide 858.1 (*)    All other components within normal limits  TROPONIN I (HIGH SENSITIVITY) - Abnormal; Notable for the following components:   Troponin I (High Sensitivity) 55 (*)    All other components within normal limits  TROPONIN I (HIGH SENSITIVITY) - Abnormal; Notable for the following components:   Troponin I (High Sensitivity) 43 (*)    All other components within normal limits  MAGNESIUM  TSH  RAPID URINE DRUG SCREEN, HOSP PERFORMED  ETHANOL  CBG MONITORING, ED    EKG EKG Interpretation Date/Time:  Thursday March 09 2024 06:54:58 EDT Ventricular Rate:  193 PR Interval:    QRS Duration:  78 QT Interval:  262 QTC Calculation: 469 R Axis:   152  Text  Interpretation: Supraventricular tachycardia Right axis deviation Right ventricular hypertrophy ST & T wave abnormality, consider inferior ischemia Abnormal ECG When compared with ECG of 16-Oct-2022 11:10, PREVIOUS ECG IS PRESENT Confirmed by Derwood Kaplan [phone]) on 03/09/2024 8:22:15 AM   EKG Interpretation Date/Time:  Thursday March 09 2024 07:28:06 EDT Ventricular Rate:  115 PR Interval:  158 QRS Duration:  91 QT Interval:  347 QTC Calculation: 480 R Axis:   149  Text Interpretation: Sinus tachycardia Biatrial enlargement Right axis deviation Nonspecific repol abnormality, inferior leads Borderline prolonged QT interval PSVT resolved Confirmed by Derwood Kaplan [phone]) on 03/09/2024 8:23:56 AM        EKG Interpretation Date/Time:  Thursday March 09 2024 08:03:07 EDT Ventricular Rate:  137 PR Interval:  91 QRS Duration:  163 QT Interval:  370 QTC Calculation: 559 R Axis:   157  Text Interpretation: Sinus tachycardia VS. AFLUTTER RBBB and LPFB Nonspecific ST and T wave abnormality Confirmed by Derwood Kaplan [phone]) on 03/09/2024 8:31:53 AM         Radiology DG Chest Portable 1 View Result Date: 03/09/2024 CLINICAL DATA:  Palpitations and shortness of breath. EXAM: PORTABLE CHEST 1 VIEW COMPARISON:  Chest radiograph dated 09/24/2020. FINDINGS: Cardiomegaly. Mediastinal contours are within normal limits. Defibrillator pad overlies the left chest wall. Central pulmonary vascular congestion with mild diffuse interstitial prominence. No definite pleural effusion or pneumothorax. No acute osseous abnormality. IMPRESSION: Cardiomegaly with central pulmonary vascular congestion and possible mild interstitial edema. Electronically Signed   By: Hart Robinsons M.D.   On: 03/09/2024 09:08    Procedures .Critical Care  Performed by: Derwood Kaplan, MD Authorized by: Derwood Kaplan, MD   Critical care provider statement:    Critical care time (minutes):  81   Critical care was  necessary to treat or prevent imminent or life-threatening deterioration of the following conditions:  Circulatory failure and cardiac failure   Critical care was time spent personally by me on the following activities:  Development of treatment plan with patient or surrogate, discussions with consultants, evaluation of patient's response to treatment, examination of patient, ordering and review of laboratory studies, ordering and review of radiographic studies, ordering and performing treatments and interventions, pulse oximetry, re-evaluation of patient's condition, review of old charts and obtaining history from patient or surrogate     Medications Ordered in ED Medications  apixaban (ELIQUIS) tablet 5 mg (5 mg Oral Given 03/09/24 1033)  isosorbide mononitrate (IMDUR) 24 hr tablet 60 mg (60 mg Oral Given 03/09/24 1033)  hydrALAZINE (APRESOLINE) tablet 100 mg (100 mg Oral Given 03/09/24 1033)  metoprolol tartrate (LOPRESSOR) tablet 25 mg (25  mg Oral Given 03/09/24 1033)  adenosine (ADENOCARD) 6 MG/2ML injection 6 mg (6 mg Intravenous Given 03/09/24 0725)  adenosine (ADENOCARD) 6 MG/2ML injection 12 mg (12 mg Intravenous Given 03/09/24 0728)  metoprolol tartrate (LOPRESSOR) injection 5 mg (5 mg Intravenous Given 03/09/24 0910)  metoprolol tartrate (LOPRESSOR) injection 5 mg (5 mg Intravenous Given 03/09/24 1023)    ED Course/ Medical Decision Making/ A&P                                 Medical Decision Making Amount and/or Complexity of Data Reviewed Labs: ordered.  Risk Prescription drug management. Decision regarding hospitalization.   This patient presents to the ED with chief complaint(s) of palpitations, shortness of breath with pertinent past medical history of alcohol abuse, previous documented history of SVT and paroxysmal A-fib, CHF with preserved EF.The complaint involves an extensive differential diagnosis and also carries with it a high risk of complications and morbidity.    The  differential diagnosis includes : PSVT, A-fib with RVR. Underlying cause for either could be electrolyte abnormality, alcohol withdrawal, alcohol toxicity, dehydration, PE, hyperthyroidism and stimulant use.  The initial plan is to give patient adenosine.  Patient is consented for chemical cardioversion with adenosine.  He received 6 mg without any success.  12 mg adenosine did convert him to sinus rhythm.  When patient converted to sinus rhythm, we did not see any flutter waves.  Within minutes of conversion, patient noted to have heart rate in the 130s.  Repeat EKG shows a flutter.  I consulted cardiology service.  We have given IV metoprolol 5 mg X 3, and patient's heart rate has come down to 110-1 20 range.  I have given oral metopic this time.  Cardiology team recommends that patient be admitted to the hospital.  He has received Lovenox here.  Results of the ED workup, diagnosis and management discussed with the patient.  Additional history obtained: Records reviewed previous admission documents, previous cardiology records, medication records and echocardiogram.  Independent labs interpretation:  The following labs were independently interpreted: Elevated troponin.  Likely because of the tacky dysrhythmia.  Patient is elevated BNP and AKI  Independent visualization and interpretation of imaging: - I independently visualized the following imaging with scope of interpretation limited to determining acute life threatening conditions related to emergency care: X-ray of the chest, which revealed mild patchiness, possible interstitial edema.  I do not think we will give patient any diuretic at this time.  I think he will auto diurese since his heart rate is better.  We will defer Lasix to admitting team.  Treatment and Reassessment: Cardiology team was consulted, they recommend admission.   Final Clinical Impression(s) / ED Diagnoses Final diagnoses:  PSVT (paroxysmal supraventricular  tachycardia) (HCC)  Typical atrial flutter (HCC)  Atrial fibrillation with RVR (HCC)    Rx / DC Orders ED Discharge Orders     None         Derwood Kaplan, MD 03/09/24 1101

## 2024-03-09 NOTE — Consult Note (Addendum)
 Cardiology Consult   Patient ID: Antonio Tapia MRN: [id number]; DOB: [birth year]   Admission date: 03/09/2024  PCP:  Georganna Skeans, MD   [facility] Providers Cardiologist:  Arvilla Meres, MD       Patient Profile:   Antonio Tapia is a 43 y.o. male with ETOH abuse, atrial flutter, tobacco abuse, chronic HFrEF, hypertension who is being seen 03/09/2024 for the evaluation of palpitations.  History of Present Illness:   Mr. Gains has a past medical history as stated above. He presented to [facility] ED on 03/09/2024 complaining of palpitations. He states that he has been having palpitations and shortness of breath over the last few days. He says the symptoms became constant starting last night. He also endorses diaphoresis over the last couple of days. He denies any chest pain at this time.   While in the ED, his initial VS showed him to be afebrile, hypertensive with BP 175/125, with HR in the 180-190s. Initial EKG was thought to be SVT with HR 193 bpm. Patient was given adenosine 6 mg followed by another 12 mg around 7:30AM. EKG immediately following administration of adenosine was interpreted as sinus tachycardia with HR 115 bpm. EKG obtained shortly after appears to be a 2:1 atrial flutter with HR 137 bpm. Patient seems to have remained in atrial flutter.  Other relevant workup in the ED includes: troponin 55, BMP with elevated creatinine at 1.57 (baseline from a year ago appears 1.0-1.2), normal potassium and magnesium, CXR showed cardiomegaly with central pulmonary vascular congestion and possible mild interstitial edema, most recent vitals show BP 165/128, HR 133, RR 23.  He has been followed by advanced heart failure in the past and was a patient of Dr. Gala Romney. He was admitted to the hospital 09/2020 for acute on chronic HFrEF complicated by SVT which required emergent DCCV and intubation. He was placed on norepinephrine + milrinone + IV amiodarone. He was  improving gradually and had a RHC/LHC done on 09/30/2020 that showed: normal coronaries, EF 40-45% (improved from prior reading of 20%), well compensated filling pressures. When he was discharged his weight was 261 lb. He was seen in follow up a few times throughout 2022 after his admission, where he underwent some additional studies. Most recent echocardiogram being from 11/2021 that showed: EF 60-65%, mild LVH, normal RV function, biatrial enlargement, trivial MR, normal IVC.   He was last seen in advanced heart failure outpatient office 10/2022 for heart failure follow up. At this appointment he reported doing well, denied any shortness of breath/PND/orthopnea. He admitted not weighing himself regularly at home taking his Entresto intermittently, often missing the afternoon dose, drinking a couple alcoholic beverages 3-4 days/week, smoking 4-5 cigarettes a day while at work. He appears euvolemic on exam at this appointment with a weight of 290 lb. His treatment plan at this visit in regards to his heart failure included: Entresto 97-103 mg BID, spironolactone 25 mg daily. They discussed he had failed SGLT2i in the past and they were considering adding Toprol as next step and wanted to repeat an echo in 6 months to ensure EF remained stable, appears patient never got that echocardiogram done.   The patient was noted to be in NSR at this appointment in 10/2022. He had been off amiodarone and deferred starting any anticoagulation. His hypertension was not well controlled at this visit, noted to be 162/104. They discussed the importance of taking GDMT as directed, wrote for him to have a home BP  cuff and taking his BP daily with a log. Again, they discussed the addition of Toprol to his medication regimen. Patient was to follow back in 3-4 weeks with PharmD for BP check and 6 months with MD for follow up with updated echocardiogram. He never returned to the practice.  After speaking with this patient, he agrees  with the history as stated above. He admits to not  having any of his medications for at least 2 months and has not been taking daily weights or his BP regularly. He states that he was having palpitations for 2 days and then became short of breath related to his palpitations. He does not currently feeling short of breath, not on any supplemental oxygen and does not have chest pain. Feels better than when he arrived. The only thing out of the ordinary that he reports is having the flu 1.5 weeks ago, but noted these symptoms only started about 2 days ago.   Past Medical History:  Diagnosis Date   Acute congestive heart failure (HCC)    Alcohol use    Atrial flutter (HCC)    Essential hypertension    Hypertension    Phreesia 10/29/2020   Tobacco use    Past Surgical History:  Procedure Laterality Date   RIGHT/LEFT HEART CATH AND CORONARY ANGIOGRAPHY N/A 09/30/2020   Procedure: RIGHT/LEFT HEART CATH AND CORONARY ANGIOGRAPHY;  Surgeon: Dolores Patty, MD;  Location: [facility];  Service: Cardiovascular;  Laterality: N/A;    Medications Prior to Admission: Prior to Admission medications   Medication Sig Start Date End Date Taking? Authorizing Provider  acetaminophen (TYLENOL) 500 MG tablet Take 1,500 mg by mouth every 6 (six) hours as needed for mild pain (pain score 1-3) or moderate pain (pain score 4-6).   Yes [provider]  Multiple Vitamin (MULTIVITAMIN WITH MINERALS) TABS tablet Take 1 tablet by mouth daily.   Yes [provider]  sacubitril-valsartan (ENTRESTO) 97-103 MG Take 1 tablet by mouth 2 (two) times daily. 10/16/22  Yes Milford, Anderson Malta, FNP  spironolactone (ALDACTONE) 25 MG tablet Take 1 tablet (25 mg total) by mouth daily. NEEDS FOLLOW UP APPOINTMENT FOR MORE REFILLS 11/04/23  Yes Jacklynn Ganong, [place]  Blood Pressure Monitor MISC Use as Directed to check Blood Pressure 10/16/22   Milford, Anderson Malta, FNP    Allergies:   No Known  Allergies  Social History:   Social History   Socioeconomic History   Marital status: Single    Spouse name: Not on file   Number of children: Not on file   Years of education: Not on file   Highest education level: Not on file  Occupational History   Not on file  Tobacco Use   Smoking status: Every Day   Smokeless tobacco: Never  Substance and Sexual Activity   Alcohol use: Yes    Comment: 1 pint liquor/day   Drug use: No   Sexual activity: Not on file  Other Topics Concern   Not on file  Social History Narrative   Not on file   Social Drivers of Health   Financial Resource Strain: Low Risk  (09/17/2020)   Overall Financial Resource Strain (CARDIA)    Difficulty of Paying Living Expenses: Not very hard  Food Insecurity: No Food Insecurity (09/05/2021)   Hunger Vital Sign    Worried About Running Out of Food in the Last Year: Never true    Ran Out of Food in the Last Year: Never true  Transportation Needs: No Transportation Needs (09/05/2021)   PRAPARE - Administrator, Civil Service (Medical): No    Lack of Transportation (Non-Medical): No  Physical Activity: Not on file  Stress: Not on file  Social Connections: Not on file  Intimate Partner Violence: Not on file    Family History:   The patient's family history includes Breast cancer in his paternal grandfather; Diabetes in his paternal grandmother; Hypertension in his father and mother.    ROS:  Please see the history of present illness.  All other ROS reviewed and negative.     Physical Exam/Data:   Vitals:   03/09/24 0835 03/09/24 0840 03/09/24 0845 03/09/24 1000  BP: (!) 163/129 (!) 166/128 (!) 165/128 (!) 138/92  Pulse: (!) 133 (!) 132 (!) 132 (!) 116  Resp: (!) 21 (!) 28 (!) 23 16  Temp:      SpO2: 100% 98% 100% 94%  Weight:      Height:       No intake or output data in the 24 hours ending 03/09/24 1029    03/09/2024    7:04 AM 11/12/2022    1:24 PM 10/16/2022   11:03 AM  Last 3  Weights  Weight (lbs) 270 lb 283 lb 6.4 oz 290 lb 12.8 oz  Weight (kg) 122.471 kg 128.549 kg 131.906 kg     Body mass index is 36.62 kg/m.  General:  Obese male, in no acute distress, currently on room air  HEENT: normal Neck: no JVD Vascular: Distal pulses 2+ bilaterally   Cardiac:  tachycardic; no murmur  Lungs:  clear to auscultation bilaterally, no wheezing, rhonchi or rales  Abd: soft, nontender, no hepatomegaly  Ext: no edema Musculoskeletal:  No deformities Skin: warm and dry  Neuro:  no focal abnormalities noted Psych:  Normal affect   EKG:  The ECG that was done 03/09/2024 was personally reviewed and demonstrates atrial flutter with HR 137 bpm  Telemetry: atrial flutter with HR 126 bpm  Relevant CV Studies: Echocardiogram, ordered updated pending results  RHC/LHC 09/30/2020 Findings:  Ao = 123/86 (104) LV = 126/8 RA =  2 RV = 33/8 PA = 32/5 (21) PCW = 4 Fick cardiac output/index = 8.2/3.3 PVR = 2.6 WU FA sat = 99% PA sat = 76%, 77%   Assessment: 1. 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 Well-compensated filling pressures  Laboratory Data:  High Sensitivity Troponin:   Recent Labs  Lab 03/09/24 0655  TROPONINIHS 55*      Chemistry Recent Labs  Lab 03/09/24 0655  NA 136  K 3.8  CL 101  CO2 24  GLUCOSE 105*  BUN 23*  CREATININE 1.57*  CALCIUM 9.1  MG 2.0  GFRNONAA 56*  ANIONGAP 11    No results for input(s): "PROT", "ALBUMIN", "AST", "ALT", "ALKPHOS", "BILITOT" in the last 168 hours. Lipids No results for input(s): "CHOL", "TRIG", "HDL", "LABVLDL", "LDLCALC", "CHOLHDL" in the last 168 hours. Hematology Recent Labs  Lab 03/09/24 0655  WBC 6.8  RBC 4.49  HGB 15.5  HCT 46.4  MCV 103.3*  MCH 34.5*  MCHC 33.4  RDW 13.0  PLT 234   Thyroid No results for input(s): "TSH", "FREET4" in the last 168 hours. BNP Recent Labs  Lab 03/09/24 0655  BNP 858.1*    DDimer No results for input(s): "DDIMER" in the last 168  hours.  Radiology/Studies:  DG Chest Portable 1 View Result Date: 03/09/2024 CLINICAL DATA:  Palpitations and shortness of breath.  EXAM: PORTABLE CHEST 1 VIEW COMPARISON:  Chest radiograph dated 09/24/2020. FINDINGS: Cardiomegaly. Mediastinal contours are within normal limits. Defibrillator pad overlies the left chest wall. Central pulmonary vascular congestion with mild diffuse interstitial prominence. No definite pleural effusion or pneumothorax. No acute osseous abnormality. IMPRESSION: Cardiomegaly with central pulmonary vascular congestion and possible mild interstitial edema. Electronically Signed   By: Hart Robinsons M.D.   On: 03/09/2024 09:08   Assessment and Plan:   Atrial flutter with RVR  Patient presented to ED complaining of palpitations x 2 days  Patient appears to be in atrial flutter with HR in 120s, was in 180-190s on arrival Initial EKG appeared to possible by SVT with HR 193 so he was given adenosine 6 mg followed by 12 mg -- follow up EKG once HR was slowed down showed atrial flutter  Has history of atrial flutter in the past, has deferred anticoagulation  Ordered TSH, UDS, ETOH level, pending results  Started on Eliquis 5 mg BID  Started Lopressor PO 25 mg Q6 Started hydralazine 100 mg TID Started Imdur 60 mg daily  Will schedule for TEE DCCV, tomorrow (after 3 doses of Eliquis) Will make NPO at midnight   Chronic systolic heart failure with recovered EF  EF 20% in 2021, recovered to 60-65% in 2022 Ordered updated echocardiogram, pending results Patient has not been taking any medications for at least 2 months  Was previously on high dose Entresto and spironolactone  Electrolytes are normal creatinine 1.57 (baseline 1.0-1.2) Pending BNP Hold off on GDMT due to AKI -- will work on restarting with improving renal function  Ordered updated echocardiogram, pending results  Started medications as listed above  Uncontrolled hypertension Patient reports being  noncompliant with medications Was given BP cuff when he last saw AHF, does not regularly check his BP  Patient denies any headaches, vision changes, dizziness  BP was 181/143 upon arrival, most recently 140s/120s  Started medications as listed above   Per primary ETOH abuse Tobacco abuse Snoring, possible OSA  Risk Assessment/Risk Scores:     New York Heart Association (NYHA) Functional Class NYHA Class I    For questions or updates, please contact [facility] Please consult [email or web address] for contact info under     Signed, Olena Leatherwood, PA-C  03/09/2024 10:29 AM

## 2024-03-09 NOTE — Progress Notes (Signed)
 Heart Failure Navigator Progress Note  Assessed for Heart & Vascular TOC clinic readiness.  Patient does not meet criteria due to Advanced Heart Failure Team patient of Dr. Gala Romney. .   Navigator will sign off at this time.   Rhae Hammock, BSN, Scientist, clinical (histocompatibility and immunogenetics) Only

## 2024-03-09 NOTE — ED Triage Notes (Signed)
 Patient reports palpitations and shortness of breath x2days. Denies chest pain. HR 200 in triage - patient moved to room.

## 2024-03-09 NOTE — ED Notes (Signed)
 Pt HR noted in the 130's.  ECG shot and EDP notified.

## 2024-03-09 NOTE — H&P (Signed)
 [facility] History and Physical  Antonio Tapia GNF:[id number] DOB: 08/01/1981 DOA: 03/09/2024 PCP: Georganna Skeans, MD  Presented from: Home Chief Complaint: Palpitation  History of Present Illness: Antonio Tapia is a 43 y.o. male with PMH significant for chronic smoking, chronic alcoholism, A-flutter, HTN. He has a history of nonischemic cardiomyopathy low EF heart failure which eventually recovered in 2022.  Patient presented to ED today with complaint of palpitation, shortness of breath progressively worsening for last few days. Symptoms constant starting last night.   Reports compliance to medications including Entresto. Reports last alcohol use was 4 days ago  In the ED, patient is afebrile, heart rate initially was in 180s, blood pressure in 180s currently on room air. He was given adenosine 6 mg without any success, ordered 12 mg which converted him to sinus rhythm. Within minutes of conversion to normal sinus time, patient went back to a flutter with heart rate in 130s. EKG with A-fib with RVR at the rate of 137 bpm, QTc of 500 Seen by cardiology in the ED.  Labs with WBC count 6.8, BUN/creatinine 23/1.57, BNP 858 Troponin 55 >43 Urine drug screen positive for THC Blood alcohol level was not elevated Chest x-ray showed cardiomegaly with central pulmonary vascular congestion and possible mild interstitial edema.  Seen by cardiology Hospitalist service was consulted for inpatient management.  On my evaluation, patient was lying on bed.  Not in distress.  Alert, awake, oriented x 3. History reviewed and detailed as above. Not in alcohol withdrawal.  Reports he is drink was several days ago.  He is trying to quit. He admits that he has been 'terrible' with compliance to medicines and promises to do better. States that his girlfriend believes he has sleep apnea because he snores at night and has apneic episodes too.Z  Chart reviewed In the last several hours,  heart rate has gradually improved, currently in 90s, blood pressure 120s, breathing on room air  Review of Systems:  All systems were reviewed and were negative unless otherwise mentioned in the HPI   Past medical history: Past Medical History:  Diagnosis Date   Acute congestive heart failure (HCC)    Alcohol use    Atrial flutter (HCC)    Essential hypertension    Hypertension    Phreesia 10/29/2020   Tobacco use     Past surgical history: Past Surgical History:  Procedure Laterality Date   RIGHT/LEFT HEART CATH AND CORONARY ANGIOGRAPHY N/A 09/30/2020   Procedure: RIGHT/LEFT HEART CATH AND CORONARY ANGIOGRAPHY;  Surgeon: Dolores Patty, MD;  Location: [facility];  Service: Cardiovascular;  Laterality: N/A;    Social History:  reports that he has been smoking. He has never used smokeless tobacco. He reports current alcohol use. He reports that he does not use drugs.  Allergies:  No Known Allergies Patient has no known allergies.   Family history:  Family History  Problem Relation Age of Onset   Hypertension Mother    Hypertension Father    Diabetes Paternal Grandmother    Breast cancer Paternal Grandfather      Physical Exam: Vitals:   03/09/24 1122 03/09/24 1200 03/09/24 1229 03/09/24 1535  BP:   (!) 121/98 130/75  Pulse:   (!) 127   Resp:   20 18  Temp: 98.6 F (37 C)  98 F (36.7 C) 98.2 F (36.8 C)  TempSrc: Oral   Oral  SpO2:   97%   Weight:  125.4 kg  Height:       Wt Readings from Last 3 Encounters:  03/09/24 125.4 kg  11/12/22 128.5 kg  10/16/22 131.9 kg   Body mass index is 37.49 kg/m.  General exam: Pleasant, young African-American male, obese Skin: No rashes, lesions or ulcers. HEENT: Atraumatic, normocephalic, no obvious bleeding Lungs: Scattered wheezing bilaterally CVS: S1, S2, no murmur,   GI/Abd: Soft, nontender, nondistended, bowel sound present,   CNS: Alert, awake, alert x 3 Psychiatry: Mood appropriate,   Extremities: Trace bilateral pedal edema, no calf tenderness,    ----------------------------------------------------------------------------------------------------------------------------------------- ----------------------------------------------------------------------------------------------------------------------------------------- -----------------------------------------------------------------------------------------------------------------------------------------  Assessment/Plan: Principal Problem:   Atrial fibrillation with RVR (HCC) Active Problems:   Acute systolic heart failure (HCC)   SOB (shortness of breath)   AKI (acute kidney injury) (HCC)  A flutter with RVR Presented with several days of shortness of breath, tachycardia Found to be in a flutter Endorses noncompliance to medications. Seen by cardiology.  Started on metoprolol tartrate 20 mg every 6 hours. Noted a plan of cardioversion tomorrow. Anticoagulation started with Eliquis 5 mg twice daily. Continue to monitor in telemetry  H/o systolic CHF Nonischemic cardiomyopathy EF was low in 20s and later recovered in 2022. Given noncompliance to medications, ongoing smoking -patient probably has a systolic CHF again.   Pending echocardiogram Cardiology started metoprolol, Imdur, hydralazine  Elevated troponin Likely due to LV strain Recent Labs    03/09/24 0655 03/09/24 0916  TROPONINIHS 55* 43*   AKI Creatinine elevated to 1.57.  Monitor on diuresis. Recent Labs    03/09/24 0655  BUN 23*  CREATININE 1.57*   Chronic smoking Has scattered bilateral end expiratory wheezing  Probably COPD from long-term smoking.   Counseled to quit smoking.  Negative patch offered  Prolonged Qtc EKG with QTc more than 500 ms. Watch out electrolytes Recent Labs  Lab 03/09/24 0655  K 3.8  MG 2.0   History of alcoholism Reports he quit 5 days ago ?  Reliability.  CIWA protocol with as needed  Ativan  Suspect OSA States that his girlfriend believes he has sleep apnea because he snores at night and has apneic episodes too.  Mobility: Encourage ambulation  Goals of care:   Code Status: Full Code    DVT prophylaxis:   apixaban (ELIQUIS) tablet 5 mg   Antimicrobials: None Fluid: None Consultants: Cardiology Family Communication: None  Status: Patient Level of care:  Progressive   Patient is from: Home Anticipated d/c to: Pending clinical course  Diet: Diet Order             Diet Heart Room service appropriate? Yes; Fluid consistency: Thin  Diet effective now                    ------------------------------------------------------------------------------------- Severity of Illness: The appropriate patient status for this patient is INPATIENT. Inpatient status is judged to be reasonable and necessary in order to provide the required intensity of service to ensure the patient's safety. The patient's presenting symptoms, physical exam findings, and initial radiographic and laboratory data in the context of their chronic comorbidities is felt to place them at high risk for further clinical deterioration. Furthermore, it is not anticipated that the patient will be medically stable for discharge from the hospital within 2 midnights of admission.   * I certify that at the point of admission it is my clinical judgment that the patient will require inpatient hospital care spanning beyond 2 midnights from the point of admission due to high intensity of service, high  risk for further deterioration and high frequency of surveillance required.* -------------------------------------------------------------------------------------  Home Meds: Prior to Admission medications   Medication Sig Start Date End Date Taking? Authorizing Provider  acetaminophen (TYLENOL) 500 MG tablet Take 1,500 mg by mouth every 6 (six) hours as needed for mild pain (pain score 1-3) or moderate pain  (pain score 4-6).   Yes [provider]  Multiple Vitamin (MULTIVITAMIN WITH MINERALS) TABS tablet Take 1 tablet by mouth daily.   Yes [provider]  sacubitril-valsartan (ENTRESTO) 97-103 MG Take 1 tablet by mouth 2 (two) times daily. 10/16/22  Yes [place], Anderson Malta, FNP  spironolactone (ALDACTONE) 25 MG tablet Take 1 tablet (25 mg total) by mouth daily. NEEDS FOLLOW UP APPOINTMENT FOR MORE REFILLS 11/04/23  Yes Jacklynn Ganong, FNP    Labs on Admission:   CBC: Recent Labs  Lab 03/09/24 0655  WBC 6.8  HGB 15.5  HCT 46.4  MCV 103.3*  PLT 234    Basic Metabolic Panel: Recent Labs  Lab 03/09/24 0655  NA 136  K 3.8  CL 101  CO2 24  GLUCOSE 105*  BUN 23*  CREATININE 1.57*  CALCIUM 9.1  MG 2.0    Liver Function Tests: No results for input(s): "AST", "ALT", "ALKPHOS", "BILITOT", "PROT", "ALBUMIN" in the last 168 hours. No results for input(s): "LIPASE", "AMYLASE" in the last 168 hours. No results for input(s): "AMMONIA" in the last 168 hours.  Cardiac Enzymes: No results for input(s): "CKTOTAL", "CKMB", "CKMBINDEX", "TROPONINI" in the last 168 hours.  BNP (last 3 results) Recent Labs    03/09/24 0655  BNP 858.1*    ProBNP (last 3 results) No results for input(s): "PROBNP" in the last 8760 hours.  CBG: Recent Labs  Lab 03/09/24 0731  GLUCAP 98    Lipase  No results found for: "LIPASE"   Urinalysis    Component Value Date/Time   COLORURINE YELLOW 08/23/2016 2124   APPEARANCEUR CLEAR 08/23/2016 2124   LABSPEC 1.037 (H) 08/23/2016 2124   PHURINE 6.0 08/23/2016 2124   GLUCOSEU NEGATIVE 08/23/2016 2124   HGBUR NEGATIVE 08/23/2016 2124   BILIRUBINUR NEGATIVE 08/23/2016 2124   KETONESUR NEGATIVE 08/23/2016 2124   PROTEINUR NEGATIVE 08/23/2016 2124   UROBILINOGEN 0.2 05/06/2011 1632   NITRITE NEGATIVE 08/23/2016 2124   LEUKOCYTESUR NEGATIVE 08/23/2016 2124     Drugs of Abuse     Component Value Date/Time   LABOPIA NONE DETECTED  03/09/2024 1037   COCAINSCRNUR NONE DETECTED 03/09/2024 1037   LABBENZ NONE DETECTED 03/09/2024 1037   AMPHETMU NONE DETECTED 03/09/2024 1037   THCU POSITIVE (A) 03/09/2024 1037   LABBARB NONE DETECTED 03/09/2024 1037      Radiological Exams on Admission: DG Chest Portable 1 View Result Date: 03/09/2024 CLINICAL DATA:  Palpitations and shortness of breath. EXAM: PORTABLE CHEST 1 VIEW COMPARISON:  Chest radiograph dated 09/24/2020. FINDINGS: Cardiomegaly. Mediastinal contours are within normal limits. Defibrillator pad overlies the left chest wall. Central pulmonary vascular congestion with mild diffuse interstitial prominence. No definite pleural effusion or pneumothorax. No acute osseous abnormality. IMPRESSION: Cardiomegaly with central pulmonary vascular congestion and possible mild interstitial edema. Electronically Signed   By: Hart Robinsons M.D.   On: 03/09/2024 09:08     Signed, Lorin Glass, MD [facility] 03/09/2024

## 2024-03-10 ENCOUNTER — Other Ambulatory Visit (HOSPITAL_COMMUNITY): Payer: Self-pay

## 2024-03-10 ENCOUNTER — Inpatient Hospital Stay (HOSPITAL_COMMUNITY): Payer: MEDICAID

## 2024-03-10 ENCOUNTER — Inpatient Hospital Stay (HOSPITAL_COMMUNITY): Payer: Self-pay

## 2024-03-10 ENCOUNTER — Encounter (HOSPITAL_COMMUNITY)
Admission: EM | Disposition: A | Discharge status: Home / Self Care | Payer: Self-pay | Source: Home / Self Care | Attending: Internal Medicine

## 2024-03-10 DIAGNOSIS — N179 Acute kidney failure, unspecified: Secondary | ICD-10-CM

## 2024-03-10 DIAGNOSIS — I5022 Chronic systolic (congestive) heart failure: Secondary | ICD-10-CM

## 2024-03-10 DIAGNOSIS — I483 Typical atrial flutter: Secondary | ICD-10-CM

## 2024-03-10 DIAGNOSIS — R0602 Shortness of breath: Secondary | ICD-10-CM | POA: Diagnosis not present

## 2024-03-10 DIAGNOSIS — I5021 Acute systolic (congestive) heart failure: Secondary | ICD-10-CM

## 2024-03-10 DIAGNOSIS — I4891 Unspecified atrial fibrillation: Secondary | ICD-10-CM | POA: Diagnosis not present

## 2024-03-10 DIAGNOSIS — I4892 Unspecified atrial flutter: Secondary | ICD-10-CM

## 2024-03-10 LAB — ECHOCARDIOGRAM COMPLETE
AR max vel: 2.79 cm2
AV Area mean vel: 2.8 cm2
AV Peak grad: 32616.4 mmHg
Ao pk vel: 90.3 m/s
Height: 72 in
MV VTI: 2.51 cm2
S' Lateral: 3.8 cm
Single Plane A2C EF: 49.9 %
Single Plane A4C EF: 53.2 %
Weight: 4422.4 [oz_av]

## 2024-03-10 LAB — BASIC METABOLIC PANEL WITH GFR
Anion gap: 6 (ref 5–15)
BUN: 19 mg/dL (ref 6–20)
CO2: 25 mmol/L (ref 22–32)
Calcium: 8.4 mg/dL — ABNORMAL LOW (ref 8.9–10.3)
Chloride: 106 mmol/L (ref 98–111)
Creatinine, Ser: 1.33 mg/dL — ABNORMAL HIGH (ref 0.61–1.24)
GFR, Estimated: >60 mL/min (ref >60)
Glucose, Bld: 97 mg/dL (ref 70–99)
Potassium: 4.1 mmol/L (ref 3.5–5.1)
Sodium: 137 mmol/L (ref 135–145)

## 2024-03-10 LAB — CBC
HCT: 39.3 % (ref 39.0–52.0)
Hemoglobin: 13.4 g/dL (ref 13.0–17.0)
MCH: 34.8 pg — ABNORMAL HIGH (ref 26.0–34.0)
MCHC: 34.1 g/dL (ref 30.0–36.0)
MCV: 102.1 fL — ABNORMAL HIGH (ref 80.0–100.0)
Platelets: 203 10*3/uL (ref 150–400)
RBC: 3.85 MIL/uL — ABNORMAL LOW (ref 4.22–5.81)
RDW: 12.9 % (ref 11.5–15.5)
WBC: 5 10*3/uL (ref 4.0–10.5)
nRBC: 0 % (ref 0.0–0.2)

## 2024-03-10 LAB — HIV ANTIBODY (ROUTINE TESTING W REFLEX): HIV Screen 4th Generation wRfx: NONREACTIVE

## 2024-03-10 SURGERY — TRANSESOPHAGEAL ECHOCARDIOGRAM (TEE) (CATHLAB)
Anesthesia: Monitor Anesthesia Care

## 2024-03-10 MED ORDER — LOSARTAN POTASSIUM 50 MG PO TABS
50.0000 mg | ORAL_TABLET | Freq: Every day | ORAL | Status: DC
  Administered 2024-03-10 – 2024-03-11 (×2): 50 mg via ORAL
  Filled 2024-03-10 (×2): qty 1

## 2024-03-10 MED ORDER — SACUBITRIL-VALSARTAN 24-26 MG PO TABS
1.0000 | ORAL_TABLET | Freq: Two times a day (BID) | ORAL | Status: DC
  Filled 2024-03-10: qty 1

## 2024-03-10 MED ORDER — SPIRONOLACTONE 12.5 MG HALF TABLET
12.5000 mg | ORAL_TABLET | Freq: Every day | ORAL | Status: DC
  Administered 2024-03-10 – 2024-03-11 (×2): 12.5 mg via ORAL
  Filled 2024-03-10 (×2): qty 1

## 2024-03-10 MED ORDER — FUROSEMIDE 10 MG/ML IJ SOLN
40.0000 mg | Freq: Once | INTRAMUSCULAR | Status: AC
  Administered 2024-03-10: 40 mg via INTRAVENOUS
  Filled 2024-03-10: qty 4

## 2024-03-10 MED ORDER — CARVEDILOL 12.5 MG PO TABS: 12.5000 mg | ORAL_TABLET | Freq: Two times a day (BID) | ORAL | Status: DC

## 2024-03-10 MED ORDER — METOPROLOL SUCCINATE ER 100 MG PO TB24
100.0000 mg | ORAL_TABLET | Freq: Every day | ORAL | Status: DC
  Administered 2024-03-10 – 2024-03-11 (×2): 100 mg via ORAL
  Filled 2024-03-10 (×2): qty 1

## 2024-03-10 MED ORDER — DABIGATRAN ETEXILATE MESYLATE 150 MG PO CAPS
150.0000 mg | ORAL_CAPSULE | Freq: Two times a day (BID) | ORAL | Status: DC
  Administered 2024-03-10 – 2024-03-11 (×2): 150 mg via ORAL
  Filled 2024-03-10 (×2): qty 1

## 2024-03-10 MED ORDER — FUROSEMIDE 40 MG PO TABS: 40.0000 mg | ORAL_TABLET | Freq: Every day | ORAL | Status: DC

## 2024-03-10 MED ORDER — DABIGATRAN ETEXILATE MESYLATE 150 MG PO CAPS: 150.0000 mg | ORAL_CAPSULE | Freq: Two times a day (BID) | ORAL | 1 refills | Status: DC

## 2024-03-10 MED ORDER — EMPAGLIFLOZIN 10 MG PO TABS
10.0000 mg | ORAL_TABLET | Freq: Every day | ORAL | Status: DC
  Administered 2024-03-10 – 2024-03-11 (×2): 10 mg via ORAL
  Filled 2024-03-10 (×2): qty 1

## 2024-03-10 NOTE — Plan of Care (Signed)
   Problem: Education: Goal: Knowledge of General Education information will improve Description Including pain rating scale, medication(s)/side effects and non-pharmacologic comfort measures Outcome: Progressing   Problem: Health Behavior/Discharge Planning: Goal: Ability to manage health-related needs will improve Outcome: Progressing

## 2024-03-10 NOTE — Progress Notes (Signed)
 [facility] PROGRESS NOTE   Antonio Tapia ZOX:[id number] DOB: [birth year] DOA: 03/09/2024  PCP: Georganna Skeans, MD  Brief History: 43 y.o. male with PMH significant for chronic smoking, chronic alcoholism, A-flutter, HTN. He has a history of nonischemic cardiomyopathy low EF heart failure which eventually recovered in 2022.  Patient has been noncompliant with his medications and follow-up.  Presented with palpitations, shortness of breath.  Found to have atrial flutter with RVR.  Hospitalized for further management.  Consultants: Cardiology  Procedures: Echocardiogram is pending    Subjective/Interval History: Feels better this morning.  Denies any chest pain or shortness of breath.  No nausea vomiting.  Denies any leg swelling.    Assessment/Plan:  Atrial flutter with RVR Patient was seen by cardiology.  Initial plan was for TEE followed by DC cardioversion.  However patient converted to sinus rhythm overnight. Currently noted to be on metoprolol and apixaban. Further management per cardiology. Mildly elevated troponin level likely due to arrhythmia.  Chronic systolic CHF/nonischemic cardiomyopathy EF was in the 20s and later recovered. Echocardiogram is pending from this admission. Patient is started on Entresto.  To be on metoprolol.  Also on spironolactone. Volume status seems to be stable.  Ins and outs and daily weights.  Not noted to be on any diuretics currently. Cardiology is following.  Acute kidney injury Creatinine was normal in 2023.  Came in with creatinine of 1.57.  Improved to 1.33 this morning.  Monitor urine output.  Will need close outpatient follow-up.  Tobacco abuse Counseled.  No wheezing appreciated on examination today.  Suspected OSA Outpatient evaluation.  Obesity Estimated body mass index is 37.49 kg/m as calculated from the following:   Height as of this encounter: 6' (1.829 m).   Weight as of this encounter: 125.4 kg.  DVT  Prophylaxis: On apixaban Code Status: Full code Family Communication: Discussed with patient Disposition Plan: Hopefully return home when cleared by cardiology      Medications: Scheduled:  apixaban  5 mg Oral BID   empagliflozin  10 mg Oral Daily   furosemide  40 mg Intravenous Once   LORazepam  0-4 mg Intravenous Q6H   Or   LORazepam  0-4 mg Oral Q6H   [START ON 03/11/2024] LORazepam  0-4 mg Intravenous Q12H   Or   [START ON 03/11/2024] LORazepam  0-4 mg Oral Q12H   metoprolol succinate  100 mg Oral Daily   nicotine  21 mg Transdermal Daily   sacubitril-valsartan  1 tablet Oral BID   sodium chloride flush  3-10 mL Intravenous Q12H   spironolactone  12.5 mg Oral Daily   thiamine  100 mg Oral Daily   Or   thiamine  100 mg Intravenous Daily   Continuous: WJX:[id number] **OR** acetaminophen, albuterol, bisacodyl, hydrALAZINE, hydrocortisone cream, polyethylene glycol, sodium chloride flush  Antibiotics: Anti-infectives (From admission, onward)    None       Objective:  Vital Signs  Vitals:   03/09/24 2105 03/09/24 2319 03/10/24 0410 03/10/24 0753  BP: 126/62 105/74 (!) 129/92 (!) 120/90  Pulse: 90 86 90 89  Resp:  18 18 19   Temp:  98.4 F (36.9 C) 98.5 F (36.9 C) 98.7 F (37.1 C)  TempSrc:  Oral Oral Oral  SpO2:  98% 98% 97%  Weight:      Height:        Intake/Output Summary (Last 24 hours) at 03/10/2024 0906 Last data filed at 03/10/2024 0804 Gross per 24 hour  Intake 360 ml  Output 700 ml  Net -340 ml   Filed Weights   03/09/24 0704 03/09/24 1200  Weight: 122.5 kg 125.4 kg    General appearance: Awake alert.  In no distress Resp: Clear to auscultation bilaterally.  Normal effort Cardio: S1-S2 is normal regular.  No S3-S4.  No rubs murmurs or bruit GI: Abdomen is soft.  Nontender nondistended.  Bowel sounds are present normal.  No masses organomegaly Extremities: No edema.  Full range of motion of lower extremities. Neurologic: Alert and  oriented x3.  No focal neurological deficits.    Lab Results:  Data Reviewed: I have personally reviewed following labs and reports of the imaging studies  CBC: Recent Labs  Lab 03/09/24 0655 03/10/24 0421  WBC 6.8 5.0  HGB 15.5 13.4  HCT 46.4 39.3  MCV 103.3* 102.1*  PLT 234 203    Basic Metabolic Panel: Recent Labs  Lab 03/09/24 0655 03/10/24 0421  NA 136 137  K 3.8 4.1  CL 101 106  CO2 24 25  GLUCOSE 105* 97  BUN 23* 19  CREATININE 1.57* 1.33*  CALCIUM 9.1 8.4*  MG 2.0  --     GFR: Estimated Creatinine Clearance: 99 mL/min (A) (by C-G formula based on SCr of 1.33 mg/dL (H)).  CBG: Recent Labs  Lab 03/09/24 0731  GLUCAP 98     Thyroid Function Tests: Recent Labs    03/09/24 1037  TSH 3.001     Recent Results (from the past 240 hours)  Respiratory (~20 pathogens) panel by PCR     Status: None   Collection Time: 03/09/24 11:01 AM   Specimen: Nasopharyngeal Swab; Respiratory  Result Value Ref Range Status   Adenovirus NOT DETECTED NOT DETECTED Final   Coronavirus 229E NOT DETECTED NOT DETECTED Final    Comment: (NOTE) The Coronavirus on the Respiratory Panel, DOES NOT test for the novel  Coronavirus (2019 nCoV)    Coronavirus HKU1 NOT DETECTED NOT DETECTED Final   Coronavirus NL63 NOT DETECTED NOT DETECTED Final   Coronavirus OC43 NOT DETECTED NOT DETECTED Final   Metapneumovirus NOT DETECTED NOT DETECTED Final   Rhinovirus / Enterovirus NOT DETECTED NOT DETECTED Final   Influenza A NOT DETECTED NOT DETECTED Final   Influenza B NOT DETECTED NOT DETECTED Final   Parainfluenza Virus 1 NOT DETECTED NOT DETECTED Final   Parainfluenza Virus 2 NOT DETECTED NOT DETECTED Final   Parainfluenza Virus 3 NOT DETECTED NOT DETECTED Final   Parainfluenza Virus 4 NOT DETECTED NOT DETECTED Final   Respiratory Syncytial Virus NOT DETECTED NOT DETECTED Final   Bordetella pertussis NOT DETECTED NOT DETECTED Final   Bordetella Parapertussis NOT DETECTED NOT  DETECTED Final   Chlamydophila pneumoniae NOT DETECTED NOT DETECTED Final   Mycoplasma pneumoniae NOT DETECTED NOT DETECTED Final    Comment: Performed at [facility], [address], [place], [place] [address]      Radiology Studies: DG Chest Portable 1 View Result Date: 03/09/2024 CLINICAL DATA:  Palpitations and shortness of breath. EXAM: PORTABLE CHEST 1 VIEW COMPARISON:  Chest radiograph dated 09/24/2020. FINDINGS: Cardiomegaly. Mediastinal contours are within normal limits. Defibrillator pad overlies the left chest wall. Central pulmonary vascular congestion with mild diffuse interstitial prominence. No definite pleural effusion or pneumothorax. No acute osseous abnormality. IMPRESSION: Cardiomegaly with central pulmonary vascular congestion and possible mild interstitial edema. Electronically Signed   By: Hart Robinsons M.D.   On: 03/09/2024 09:08       LOS: 1 day   Antonio Tapia  Antonio Tapia  [email or web address]  03/10/2024, 9:06 AM

## 2024-03-10 NOTE — Progress Notes (Signed)
   Patient Name: Antonio Tapia Date of Encounter: 03/10/2024 [facility] Cardiologist: Arvilla Meres, MD   Interval Summary  .    Patient feeling well this morning. Denies palpitations, chest pain, shortness of breath/orthopnea.   Vital Signs .    Vitals:   03/09/24 2015 03/09/24 2105 03/09/24 2319 03/10/24 0410  BP: 122/83 126/62 105/74 (!) 129/92  Pulse: 91 90 86 90  Resp: 18  18 18   Temp: 97.9 F (36.6 C)  98.4 F (36.9 C) 98.5 F (36.9 C)  TempSrc: Oral  Oral Oral  SpO2: 98%  98% 98%  Weight:      Height:        Intake/Output Summary (Last 24 hours) at 03/10/2024 0752 Last data filed at 03/10/2024 0410 Gross per 24 hour  Intake 360 ml  Output 700 ml  Net -340 ml      03/09/2024   12:00 PM 03/09/2024    7:04 AM 11/12/2022    1:24 PM  Last 3 Weights  Weight (lbs) 276 lb 6.4 oz 270 lb 283 lb 6.4 oz  Weight (kg) 125.374 kg 122.471 kg 128.549 kg      Telemetry/ECG    Patient with NSR as of 1441 on 03/27 - Personally Reviewed  Physical Exam .   GEN: No acute distress.   Neck: No JVD Cardiac: RRR, no murmurs, rubs, or gallops.  Respiratory: Clear to auscultation bilaterally. GI: Soft, nontender, non-distended  MS: No edema  Assessment & Plan .     43 year old male with history of systolic CHF with recovered EF, atrial flutter, alcohol abuse, tobacco abuse, admitted with atrial flutter with rapid ventricular response.   Atrial flutter with RVR Patient presented for evaluation after multiple days of dyspnea and rapid HR, was found to be in aflutter with RVR. (Initially concerning for SVT but upon administration of Adenosine, was seen as flutter). Patient did not appear overtly volume overloaded but given hx HFrecEF and elevated BNP, CCB avoided and rate control attempted with BB. Plans made for TEE/DCCV today with patient starting Eliquis. Spontaneous conversion to NSR at 1441 on 03/27. TSH WNL.  Continue Metoprolol Tartrate 25mg  q6hrs pending  echo. Plan to consolidate to Toprol-XL 100mg  after this study.  Continue Eliquis 5mg  BID.  Chronic systolic CHF (recovered EF) Patient with hx nonischemic cardiomyopathy, LVEF down to 20% in 2021, recovered to 60-65% in 2022. Patient without medications for at least 2 months. BNP 858.1. Troponin 55->43. Echo pending today. Suspect that LVEF will be reduced. Given improving AKI (1.57->1.33), anticipate adding ARB/ARNI as well as MRA +/- SGLT2 pending echo. Continue afterload reduction with Imdur 60mg  and hydralazine 100mg  TID.   Hypertension BP up to 181/143 mmHg this admission in the setting of medication non-adherence. Now near normal. Continue BP agents as above.   Smoking hx Suspected COPD Patient with wheezing noted during initial cardiology consult.  Management per primary team.   For questions or updates, please contact [facility] Please consult [email or web address] for contact info under        Signed, Perlie Gold, PA-C

## 2024-03-10 NOTE — Discharge Instructions (Addendum)
Information on my medicine - Pradaxa (dabigatran)  Why was Pradaxa prescribed for you? Pradaxa was prescribed for you to reduce the risk of forming blood clots that cause a stroke if you have a medical condition called atrial fibrillation (a type of irregular heartbeat).    What do you Need to know about PradAXa? Take your Pradaxa TWICE DAILY - one capsule in the morning and one tablet in the evening with or without food.  It would be best to take the doses about the same time each day.  The capsules should not be broken, chewed or opened - they must be swallowed whole.  Do not store Pradaxa in other medication containers - once the bottle is opened the Pradaxa should be used within FOUR months; throw away any capsules that haven't been by that time.  Take Pradaxa exactly as prescribed by your doctor.  DO NOT stop taking Pradaxa without talking to the doctor who prescribed the medication.  Stopping without other stroke prevention medication to take the place of Pradaxa may increase your risk of developing a clot that causes a stroke.  Refill your prescription before you run out.  After discharge, you should have regular check-up appointments with your healthcare provider that is prescribing your Pradaxa.  In the future your dose may need to be changed if your kidney function or weight changes by a significant amount.  What do you do if you miss a dose? If you miss a dose, take it as soon as you remember on the same day.  If your next dose is less than 6 hours away, skip the missed dose.  Do not take two doses of PRADAXA at the same time.  Important Safety Information A possible side effect of Pradaxa is bleeding. You should call your healthcare provider right away if you experience any of the following: ? Bleeding from an injury or your nose that does not stop. ? Unusual colored urine (red or dark brown) or unusual colored stools (red or black). ? Unusual bruising for unknown  reasons. ? A serious fall or if you hit your head (even if there is no bleeding).  Some medicines may interact with Pradaxa and might increase your risk of bleeding or clotting while on Pradaxa. To help avoid this, consult your healthcare provider or pharmacist prior to using any new prescription or non-prescription medications, including herbals, vitamins, non-steroidal anti-inflammatory drugs (NSAIDs) and supplements.  This website has more information on Pradaxa (dabigatran): [email or web address]://www.pradaxa.com     

## 2024-03-10 NOTE — TOC CM/SW Note (Signed)
 Transition of Care [facility]) - Inpatient Brief Assessment   Patient Details  Name: Glenda Kunst MRN: [id number] Date of Birth: [birth year]  [facility] CM/SW Contact:    Gala Lewandowsky, RN Phone Number: 03/10/2024, 2:39 PM   Clinical Narrative: Patient presented for Atrial Fib. PTA patient states he was from home with girlfriend. Mother in the room at the time of visit. Patient states he is employed; however, no insurance or PCP. Case Manager spoke with patient regarding PCP and he is agreeable for CMA to schedule an appointment at one of the local clinics. Unit Pharmacist has worked with the patient regarding Pradaxa- Rx sent to [facility] and patient has the Good Rx coupon for $65.00 for this medication-states he can afford. Jardiance and generic meds to be filled at [facility]. Case Manager will continue to follow for medication cost.    Transition of Care Asessment: Insurance and Status:  (No insurance) Patient has primary care physician: No (Requested CMA to schedule an appointment) Home environment has been reviewed: reviewed Prior level of function:: independent Prior/Current Home Services: No current home services Social Drivers of Health Review: SDOH reviewed no interventions necessary Readmission risk has been reviewed: Yes Transition of care needs: no transition of care needs at this time

## 2024-03-10 NOTE — Progress Notes (Addendum)
 Pt got in the shower unbeknownst to this RN, IV is saturated and needs to be removed. Notified Sundil, MD with permission to discontinue IV access as pt is discharging tomorrow & not receiving any IV medications.    Bari Edward, RN

## 2024-03-10 NOTE — Progress Notes (Signed)
  Echocardiogram 2D Echocardiogram has been performed.  Ocie Doyne RDCS 03/10/2024, 11:21 AM

## 2024-03-11 ENCOUNTER — Other Ambulatory Visit (HOSPITAL_COMMUNITY): Payer: Self-pay

## 2024-03-11 DIAGNOSIS — I5043 Acute on chronic combined systolic (congestive) and diastolic (congestive) heart failure: Secondary | ICD-10-CM

## 2024-03-11 DIAGNOSIS — G4733 Obstructive sleep apnea (adult) (pediatric): Secondary | ICD-10-CM

## 2024-03-11 DIAGNOSIS — I483 Typical atrial flutter: Secondary | ICD-10-CM | POA: Diagnosis not present

## 2024-03-11 LAB — CBC
HCT: 41.4 % (ref 39.0–52.0)
Hemoglobin: 14.1 g/dL (ref 13.0–17.0)
MCH: 35.1 pg — ABNORMAL HIGH (ref 26.0–34.0)
MCHC: 34.1 g/dL (ref 30.0–36.0)
MCV: 103 fL — ABNORMAL HIGH (ref 80.0–100.0)
Platelets: 219 10*3/uL (ref 150–400)
RBC: 4.02 MIL/uL — ABNORMAL LOW (ref 4.22–5.81)
RDW: 13.2 % (ref 11.5–15.5)
WBC: 4.5 10*3/uL (ref 4.0–10.5)
nRBC: 0 % (ref 0.0–0.2)

## 2024-03-11 LAB — BASIC METABOLIC PANEL WITH GFR
Anion gap: 10 (ref 5–15)
BUN: 23 mg/dL — ABNORMAL HIGH (ref 6–20)
CO2: 25 mmol/L (ref 22–32)
Calcium: 8.7 mg/dL — ABNORMAL LOW (ref 8.9–10.3)
Chloride: 103 mmol/L (ref 98–111)
Creatinine, Ser: 1.57 mg/dL — ABNORMAL HIGH (ref 0.61–1.24)
GFR, Estimated: 56 mL/min — ABNORMAL LOW (ref >60)
Glucose, Bld: 79 mg/dL (ref 70–99)
Potassium: 3.9 mmol/L (ref 3.5–5.1)
Sodium: 138 mmol/L (ref 135–145)

## 2024-03-11 LAB — FOLATE: Folate: 7.9 ng/mL (ref >5.9)

## 2024-03-11 LAB — VITAMIN B12: Vitamin B-12: 542 pg/mL (ref 180–914)

## 2024-03-11 MED ORDER — METOPROLOL SUCCINATE ER 100 MG PO TB24
100.0000 mg | ORAL_TABLET | Freq: Every day | ORAL | 1 refills | Status: DC
  Filled 2024-03-11: qty 30, 30d supply, fill #0

## 2024-03-11 MED ORDER — ENTRESTO 24-26 MG PO TABS
1.0000 | ORAL_TABLET | Freq: Two times a day (BID) | ORAL | 1 refills | Status: DC
  Filled 2024-03-11: qty 60, 30d supply, fill #0

## 2024-03-11 MED ORDER — FUROSEMIDE 20 MG PO TABS
ORAL_TABLET | ORAL | 1 refills | Status: DC
  Filled 2024-03-11: qty 30, 30d supply, fill #0

## 2024-03-11 MED ORDER — SPIRONOLACTONE 25 MG PO TABS
12.5000 mg | ORAL_TABLET | Freq: Every day | ORAL | 1 refills | Status: DC
  Filled 2024-03-11: qty 30, 60d supply, fill #0

## 2024-03-11 MED ORDER — EMPAGLIFLOZIN 10 MG PO TABS
10.0000 mg | ORAL_TABLET | Freq: Every day | ORAL | 1 refills | Status: DC
  Filled 2024-03-11: qty 30, 30d supply, fill #0

## 2024-03-11 MED ORDER — LOSARTAN POTASSIUM 50 MG PO TABS
50.0000 mg | ORAL_TABLET | Freq: Every day | ORAL | 1 refills | Status: DC
  Filled 2024-03-11: qty 30, 30d supply, fill #0

## 2024-03-11 NOTE — Progress Notes (Signed)
   Patient Name: Antonio Tapia Date of Encounter: 03/11/2024 [facility] Cardiologist: Arvilla Meres, MD   Interval Summary  .    Feels well, eager to go home.  No dyspnea. Maintaining sinus rhythm. Core shows LVEF 45-50%.  Vital Signs .    Vitals:   03/10/24 1618 03/10/24 2020 03/10/24 2333 03/11/24 0432  BP: 119/79 122/83 110/77 (!) 119/96  Pulse: 83 92 85 77  Resp: 20 19 18 19   Temp: 97.9 F (36.6 C) 98.5 F (36.9 C) 100.2 F (37.9 C) 97.7 F (36.5 C)  TempSrc: Oral Oral Oral Oral  SpO2: 100% 98% 99% 99%  Weight:      Height:        Intake/Output Summary (Last 24 hours) at 03/11/2024 0913 Last data filed at 03/11/2024 0000 Gross per 24 hour  Intake 840 ml  Output --  Net 840 ml      03/09/2024   12:00 PM 03/09/2024    7:04 AM 11/12/2022    1:24 PM  Last 3 Weights  Weight (lbs) 276 lb 6.4 oz 270 lb 283 lb 6.4 oz  Weight (kg) 125.374 kg 122.471 kg 128.549 kg      Telemetry/ECG    Normal sinus rhythm- Personally Reviewed  ECG on admission shows very rapid narrow complex tachycardia at 193 bpm  Physical Exam .   GEN: No acute distress.   Neck: No JVD Cardiac: RRR, no murmurs, rubs, or gallops.  Respiratory: Clear to auscultation bilaterally. GI: Soft, nontender, non-distended  MS: No edema  Assessment & Plan .      His atrial flutter is extremely fast with an atrial rate of over 380 bpm, 2: 1 AV conduction with ventricular rate over 190 bpm. His presentation rhythm in 2019 was identical with a regular narrow complex tachycardia at 193 bpm. Rhythm strips during IV diltiazem infusion clearly show typical counterclockwise right atrial flutter. When he initially presented in 2019 EF was 30-35%.  Reviewed the echo in 2022: LV function normalized completely with EF 60 to 65%, it has now decreased again. 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 CHA2DS2-VASc score at  least 2 (HTN, low EF). Appears clinically euvolemic.  On appropriate medical therapy for HFrEF with losartan, spironolactone, empagliflozin and metoprolol succinate. I do not think he will need maintenance loop diuretic. Recommend outpatient referral to EP for ablation of cavotricuspid isthmus. Recommend outpatient sleep study.  It looks like this was ordered twice (2019 and then 2023) but I do not think it was ever completed.  [facility] will sign off.   Medication Recommendations:   Losartan 50 mg daily Spironolactone 12.5 mg daily Metoprolol succinate 100 mg daily Empagliflozin 10 mg daily Dabigatran 150 mg twice daily Other recommendations (labs, testing, etc): Outpatient sleep study.  Limit/abstain from alcohol.  Avoid sodium rich foods. Follow up as an outpatient: We will schedule follow-up with EP to discuss flutter ablation.  Also needs an earlier follow-up for heart failure.   For questions or updates, please contact [facility] Please consult [email or web address] for contact info under        Signed, Thurmon Fair, MD

## 2024-03-11 NOTE — Discharge Summary (Signed)
 [facility]  Physician Discharge Summary   Patient ID: Antonio Tapia MRN: [id number] DOB/AGE: Sep 20, 1981 43 y.o.  Admit date: 03/09/2024 Discharge date: 03/11/2024    PCP: Georganna Skeans, MD  DISCHARGE DIAGNOSES:  Atrial flutter   Acute systolic heart failure (HCC)   RECOMMENDATIONS FOR OUTPATIENT FOLLOW UP: Cardiology to schedule outpatient follow-up   Home Health: None Equipment/Devices: None  CODE STATUS: Full code  DISCHARGE CONDITION: fair  Diet recommendation: Heart healthy  INITIAL HISTORY: 43 y.o. male with PMH significant for chronic smoking, chronic alcoholism, A-flutter, HTN. He has a history of nonischemic cardiomyopathy low EF heart failure which eventually recovered in 2022.  Patient has been noncompliant with his medications and follow-up.  Presented with palpitations, shortness of breath.  Found to have atrial flutter with RVR.  Hospitalized for further management.   Consultants: Cardiology   Procedures: Echocardiogram   HOSPITAL COURSE:   Atrial flutter with RVR Patient was seen by cardiology.  Initial plan was for TEE followed by DC cardioversion.  However patient converted to sinus rhythm prior to cardioversion. Seen by cardiology.  Will be discharged on metoprolol and apixaban. Mildly elevated troponin level likely due to arrhythmia. Ablation to be considered in the outpatient setting.   Chronic systolic CHF/nonischemic cardiomyopathy Previously EF was in the 20s and later recovered. Echocardiogram was done during this admission and showed EF of 45 to 50%. Cardiology notes mention reinitiating Sherryll Burger but he was never initiated on Entresto.  He does not have health insurance so unable to arrange this at discharge as per case management and pharmacy.  Patient will be discharged on ARB.  Sherryll Burger can be considered in the outpatient setting at follow-up. Patient on metoprolol and spironolactone and Jardiance.   Acute kidney  injury Creatinine was normal in 2023.  Came in with creatinine of 1.57.  Improved to 1.33 this morning.  Monitor urine output.  Will need close outpatient follow-up.   Tobacco abuse Counseled.  No wheezing appreciated on examination today.   Suspected OSA Outpatient evaluation.   Obesity Estimated body mass index is 37.49 kg/m as calculated from the following:   Height as of this encounter: 6' (1.829 m).   Weight as of this encounter: 125.4 kg.   Patient stable.  Okay for discharge today.   PERTINENT LABS:  The results of significant diagnostics from this hospitalization (including imaging, microbiology, ancillary and laboratory) are listed below for reference.    Microbiology: Recent Results (from the past 240 hours)  Respiratory (~20 pathogens) panel by PCR     Status: None   Collection Time: 03/09/24 11:01 AM   Specimen: Nasopharyngeal Swab; Respiratory  Result Value Ref Range Status   Adenovirus NOT DETECTED NOT DETECTED Final   Coronavirus 229E NOT DETECTED NOT DETECTED Final    Comment: (NOTE) The Coronavirus on the Respiratory Panel, DOES NOT test for the novel  Coronavirus (2019 nCoV)    Coronavirus HKU1 NOT DETECTED NOT DETECTED Final   Coronavirus NL63 NOT DETECTED NOT DETECTED Final   Coronavirus OC43 NOT DETECTED NOT DETECTED Final   Metapneumovirus NOT DETECTED NOT DETECTED Final   Rhinovirus / Enterovirus NOT DETECTED NOT DETECTED Final   Influenza A NOT DETECTED NOT DETECTED Final   Influenza B NOT DETECTED NOT DETECTED Final   Parainfluenza Virus 1 NOT DETECTED NOT DETECTED Final   Parainfluenza Virus 2 NOT DETECTED NOT DETECTED Final   Parainfluenza Virus 3 NOT DETECTED NOT DETECTED Final   Parainfluenza Virus 4 NOT DETECTED NOT DETECTED Final  Respiratory Syncytial Virus NOT DETECTED NOT DETECTED Final   Bordetella pertussis NOT DETECTED NOT DETECTED Final   Bordetella Parapertussis NOT DETECTED NOT DETECTED Final   Chlamydophila pneumoniae NOT  DETECTED NOT DETECTED Final   Mycoplasma pneumoniae NOT DETECTED NOT DETECTED Final    Comment: Performed at [facility], [address], [place], [place] [address]     Labs:   Basic Metabolic Panel: Recent Labs  Lab 03/09/24 0655 03/10/24 0421 03/11/24 0204  NA 136 137 138  K 3.8 4.1 3.9  CL 101 106 103  CO2 24 25 25   GLUCOSE 105* 97 79  BUN 23* 19 23*  CREATININE 1.57* 1.33* 1.57*  CALCIUM 9.1 8.4* 8.7*  MG 2.0  --   --     CBC: Recent Labs  Lab 03/09/24 0655 03/10/24 0421 03/11/24 0204  WBC 6.8 5.0 4.5  HGB 15.5 13.4 14.1  HCT 46.4 39.3 41.4  MCV 103.3* 102.1* 103.0*  PLT 234 203 219    BNP (last 3 results) Recent Labs    03/09/24 0655  BNP 858.1*    CBG: Recent Labs  Lab 03/09/24 0731  GLUCAP 98     IMAGING STUDIES ECHOCARDIOGRAM COMPLETE Result Date: 03/10/2024    ECHOCARDIOGRAM REPORT   Patient Name:   Antonio Tapia Date of Exam: 03/10/2024 Medical Rec #:  [id number]         Height:       72.0 in Accession #:    [id number]        Weight:       276.0 lb Date of Birth:  24-May-1981         BSA:          2.443 m Patient Age:    42 years          BP:           120/90 mmHg Patient Gender: M                 HR:           91 bpm. Exam Location:  Inpatient Procedure: 2D Echo, 3D Echo, Cardiac Doppler, Color Doppler and Strain Analysis            (Both Spectral and Color Flow Doppler were utilized during            procedure). Indications:    Atrail flutter, CHF  History:        Patient has prior history of Echocardiogram examinations, most                 recent 12/04/2021.  Sonographer:    Vern Claude Referring Phys: Evlyn Clines A PA-C IMPRESSIONS  1. Left ventricular ejection fraction, by estimation, is 45 to 50%. Left ventricular ejection fraction by 3D volume is 50 %. The left ventricle has mildly decreased function. The left ventricle demonstrates global hypokinesis. Left ventricular diastolic  parameters are consistent with Grade III diastolic  dysfunction (restrictive). There is the interventricular septum is flattened in diastole ('D' shaped left ventricle), consistent with right ventricular volume overload.  2. Right ventricular systolic function is normal. The right ventricular size is normal.  3. The mitral valve is normal in structure. Mild mitral valve regurgitation. No evidence of mitral stenosis.  4. The aortic valve was not well visualized. Aortic valve regurgitation is not visualized. No aortic stenosis is present.  5. The inferior vena cava is dilated in size with >50% respiratory variability, suggesting right atrial  pressure of 8 mmHg. FINDINGS  Left Ventricle: Left ventricular ejection fraction, by estimation, is 45 to 50%. Left ventricular ejection fraction by 3D volume is 50 %. The left ventricle has mildly decreased function. The left ventricle demonstrates global hypokinesis. The left ventricular internal cavity size was normal in size. There is no left ventricular hypertrophy. The interventricular septum is flattened in diastole ('D' shaped left ventricle), consistent with right ventricular volume overload. Left ventricular diastolic  parameters are consistent with Grade III diastolic dysfunction (restrictive). Right Ventricle: The right ventricular size is normal. No increase in right ventricular wall thickness. Right ventricular systolic function is normal. Left Atrium: Left atrial size was normal in size. Right Atrium: Right atrial size was normal in size. Pericardium: There is no evidence of pericardial effusion. Mitral Valve: The mitral valve is normal in structure. Mild mitral valve regurgitation. No evidence of mitral valve stenosis. The mean mitral valve gradient is 2.0 mmHg. Tricuspid Valve: The tricuspid valve is not well visualized. Tricuspid valve regurgitation is trivial. No evidence of tricuspid stenosis. Aortic Valve: The aortic valve was not well visualized. Aortic valve regurgitation is not visualized. No aortic stenosis  is present. Aortic valve peak gradient measures 32616.4 mmHg. Pulmonic Valve: The pulmonic valve was grossly normal. Pulmonic valve regurgitation is not visualized. No evidence of pulmonic stenosis. Aorta: The aortic root and ascending aorta are structurally normal, with no evidence of dilitation. Venous: The inferior vena cava is dilated in size with greater than 50% respiratory variability, suggesting right atrial pressure of 8 mmHg. IAS/Shunts: There is redundancy of the interatrial septum. The atrial septum is grossly normal. Additional Comments: 3D was performed not requiring image post processing on an independent workstation and was abnormal.  LEFT VENTRICLE PLAX 2D LVIDd:         5.50 cm         Diastology LVIDs:         3.80 cm         LV e' medial:    9.46 cm/s LV PW:         1.00 cm         LV E/e' medial:  10.6 LV IVS:        0.80 cm         LV e' lateral:   9.46 cm/s LVOT diam:     2.20 cm         LV E/e' lateral: 10.6 LV SV:         3634 LV SV Index:   1488            2D Longitudinal LVOT Area:     3.80 cm        Strain                                2D Strain GLS   -12.2 %                                (A4C): LV Volumes (MOD)               2D Strain GLS   -13.1 % LV vol d, MOD    185.0 ml      (A3C): A2C:  2D Strain GLS   -15.8 % LV vol d, MOD    218.0 ml      (A2C): A4C:                           2D Strain GLS   -13.7 % LV vol s, MOD    92.6 ml       Avg: A2C: LV vol s, MOD    102.0 ml      3D Volume EF A4C:                           LV 3D EF:    Left LV SV MOD A2C:   92.4 ml                    ventricul LV SV MOD A4C:   218.0 ml                   ar                                             ejection                                             fraction                                             by 3D                                             volume is                                             50 %.                                 3D Volume EF:                                 3D EF:        50 %                                LV EDV:       324 ml                                LV ESV:       164 ml RIGHT VENTRICLE RV Basal diam:  4.50 cm RV Mid diam:    2.90 cm RV Length:      8.10 cm RV S prime:     12.60  cm/s TAPSE (M-mode): 2.1 cm LEFT ATRIUM           Index LA diam:      4.10 cm 1.68 cm/m LA Vol (A4C): 44.3 ml 18.13 ml/m  AORTIC VALVE                    PULMONIC VALVE AV Area (Vmax):  2.79 cm       PV Vmax:       71.90 m/s AV Area (Vmean): 2.80 cm       PV Peak grad:  20678.4 mmHg AV Vmax:         9030.00 cm/s AV Vmean:        5630.000 cm/s AV Peak Grad:    32616.4 mmHg LVOT Vmax:       6630.00 cm/s LVOT Vmean:      4140.000 cm/s LVOT VTI:        9.560 m  AORTA Ao Root diam: 3.60 cm Ao Asc diam:  3.10 cm MITRAL VALVE MV Area VTI:  2.51 cm     SHUNTS MV Mean grad: 2.0 mmHg     Systemic VTI:  9.56 m MV VTI:       14.50 m      Systemic Diam: 2.20 cm MV E velocity: 99.90 cm/s MV A velocity: 40.00 cm/s MV E/A ratio:  2.50 Sunit Tolia Electronically signed by Tessa Lerner Signature Date/Time: 03/10/2024/5:21:09 PM    Final    DG Chest Portable 1 View Result Date: 03/09/2024 CLINICAL DATA:  Palpitations and shortness of breath. EXAM: PORTABLE CHEST 1 VIEW COMPARISON:  Chest radiograph dated 09/24/2020. FINDINGS: Cardiomegaly. Mediastinal contours are within normal limits. Defibrillator pad overlies the left chest wall. Central pulmonary vascular congestion with mild diffuse interstitial prominence. No definite pleural effusion or pneumothorax. No acute osseous abnormality. IMPRESSION: Cardiomegaly with central pulmonary vascular congestion and possible mild interstitial edema. Electronically Signed   By: Hart Robinsons M.D.   On: 03/09/2024 09:08    DISCHARGE EXAMINATION: Vitals:   03/10/24 2020 03/10/24 2333 03/11/24 0432 03/11/24 0745  BP: 122/83 110/77 (!) 119/96 120/82  Pulse: 92 85 77 81  Resp: 19 18 19    Temp: 98.5 F (36.9 C) 100.2 F (37.9 C) 97.7 F (36.5  C)   TempSrc: Oral Oral Oral   SpO2: 98% 99% 99%   Weight:      Height:       General appearance: Awake alert.  In no distress Resp: Clear to auscultation bilaterally.  Normal effort Cardio: S1-S2 is normal regular.  No S3-S4.  No rubs murmurs or bruit GI: Abdomen is soft.  Nontender nondistended.  Bowel sounds are present normal.  No masses organomegaly Extremities: No edema.  Full range of motion of lower extremities. Neurologic: Alert and oriented x3.  No focal neurological deficits.    DISPOSITION: Home  Discharge Instructions     (HEART FAILURE PATIENTS) Call MD:  Anytime you have any of the following symptoms: 1) 3 pound weight gain in 24 hours or 5 pounds in 1 week 2) shortness of breath, with or without a dry hacking cough 3) swelling in the hands, feet or stomach 4) if you have to sleep on extra pillows at night in order to breathe.   Complete by: As directed    Call MD for:  difficulty breathing, headache or visual disturbances   Complete by: As directed    Call MD for:  extreme fatigue   Complete by: As directed  Call MD for:  persistant dizziness or light-headedness   Complete by: As directed    Call MD for:  persistant nausea and vomiting   Complete by: As directed    Call MD for:  severe uncontrolled pain   Complete by: As directed    Call MD for:  temperature >100.4   Complete by: As directed    Diet - low sodium heart healthy   Complete by: As directed    Discharge instructions   Complete by: As directed    Please take your medications as prescribed.  Cardiology will arrange outpatient follow-up.  You have been schedule an appointment with a primary care doctor.  You were cared for by a hospitalist during your hospital stay. If you have any questions about your discharge medications or the care you received while you were in the hospital after you are discharged, you can call the unit and asked to speak with the hospitalist on call if the hospitalist that took  care of you is not available. Once you are discharged, your primary care physician will handle any further medical issues. Please note that NO REFILLS for any discharge medications will be authorized once you are discharged, as it is imperative that you return to your primary care physician (or establish a relationship with a primary care physician if you do not have one) for your aftercare needs so that they can reassess your need for medications and monitor your lab values. If you do not have a primary care physician, you can call [phone] for a physician referral.   Increase activity slowly   Complete by: As directed          Allergies as of 03/11/2024   No Known Allergies      Medication List     STOP taking these medications    sacubitril-valsartan 97-103 MG Commonly known as: ENTRESTO       TAKE these medications    acetaminophen 500 MG tablet Commonly known as: TYLENOL Take 1,500 mg by mouth every 6 (six) hours as needed for mild pain (pain score 1-3) or moderate pain (pain score 4-6).   dabigatran 150 MG Caps capsule Commonly known as: PRADAXA Take 1 capsule (150 mg total) by mouth every 12 (twelve) hours.   furosemide 20 MG tablet Commonly known as: Lasix Take 1 tablet as needed for swelling of your legs or if you gain more than 3 pounds over 1 day.   Jardiance 10 MG Tabs tablet Generic drug: empagliflozin Take 1 tablet (10 mg total) by mouth daily.   losartan 50 MG tablet Commonly known as: COZAAR Take 1 tablet (50 mg total) by mouth daily.   metoprolol succinate 100 MG 24 hr tablet Commonly known as: TOPROL-XL Take 1 tablet (100 mg total) by mouth daily. Take with or immediately following a meal.   multivitamin with minerals Tabs tablet Take 1 tablet by mouth daily.   spironolactone 25 MG tablet Commonly known as: ALDACTONE Take 0.5 tablets (12.5 mg total) by mouth daily. What changed:  how much to take additional instructions           Follow-up Information     Grayce Sessions, NP Follow up.   Specialty: Internal Medicine Why: TIME : 10:50 AM DATE : Morene Antu [id number]  PLEASE BRING ALL MEDICATION and ID Contact information: [address] [place] [address] [phone]         Bensimhon, Bevelyn Buckles, MD Follow up.   Specialty: Cardiology Why: [facility] will call to arrange follow-up visit. If you do not hear anything from them within 2 business days, please call to schedule this. Contact information: [address] [place] [address] [phone]         Electrophysiology Team Follow up.   Why: Our Electrophysiology team will also call to arrange a visit to discuss possible atrial flutter ablation. Contact information: [phone]                TOTAL DISCHARGE TIME: 35 minutes  Jeslyn Amsler [employer] on [email or web address]  03/12/2024, 10:50 AM

## 2024-03-11 NOTE — Plan of Care (Signed)

## 2024-03-13 ENCOUNTER — Telehealth (INDEPENDENT_AMBULATORY_CARE_PROVIDER_SITE_OTHER): Payer: Self-pay | Admitting: Primary Care

## 2024-03-13 ENCOUNTER — Telehealth: Payer: Self-pay

## 2024-03-13 NOTE — Telephone Encounter (Signed)
 Called pt to confirm appt. Pt will be present.

## 2024-03-13 NOTE — Transitions of Care (Post Inpatient/ED Visit) (Unsigned)
   03/13/2024  Name: Antonio Tapia MRN: [id number] DOB: [birth year]  Today's TOC FU Call Status: Today's TOC FU Call Status:: Unsuccessful Call (1st Attempt) Unsuccessful Call (1st Attempt) Date: 03/13/24  Attempted to reach the patient regarding the most recent Inpatient/ED visit.  Follow Up Plan: Additional outreach attempts will be made to reach the patient to complete the Transitions of Care (Post Inpatient/ED visit) call.   Signature Karena Addison, LPN [facility] Nurse Health Advisor Direct Dial [phone]

## 2024-03-14 ENCOUNTER — Inpatient Hospital Stay (INDEPENDENT_AMBULATORY_CARE_PROVIDER_SITE_OTHER): Payer: Self-pay | Admitting: Primary Care

## 2024-03-14 ENCOUNTER — Telehealth (HOSPITAL_COMMUNITY): Payer: Self-pay

## 2024-03-14 NOTE — Telephone Encounter (Signed)
 Advanced Heart Failure Patient Advocate Encounter  Application for Entresto faxed to [employer] on 03/14/2024. Application form attached to patient chart.  Burnell Blanks, CPhT Rx Patient Advocate Phone: [phone]

## 2024-03-14 NOTE — Telephone Encounter (Signed)
 Advanced Heart Failure Patient Advocate Encounter  Application for Jardiance faxed to [facility] on 03/14/2024. Application form attached to patient chart.  Burnell Blanks, CPhT Rx Patient Advocate Phone: [phone]

## 2024-03-14 NOTE — Transitions of Care (Post Inpatient/ED Visit) (Unsigned)
   03/14/2024  Name: Antonio Tapia MRN: [id number] DOB: 1981-07-02  Today's TOC FU Call Status: Today's TOC FU Call Status:: Unsuccessful Call (2nd Attempt) Unsuccessful Call (1st Attempt) Date: 03/13/24 Unsuccessful Call (2nd Attempt) Date: 03/14/24  Attempted to reach the patient regarding the most recent Inpatient/ED visit.  Follow Up Plan: Additional outreach attempts will be made to reach the patient to complete the Transitions of Care (Post Inpatient/ED visit) call.   Signature Karena Addison, LPN [facility] Nurse Health Advisor Direct Dial [phone]

## 2024-03-15 NOTE — Transitions of Care (Post Inpatient/ED Visit) (Signed)
   03/15/2024  Name: Antonio Tapia MRN: [id number] DOB: 11-07-1981  Today's TOC FU Call Status: Today's TOC FU Call Status:: Unsuccessful Call (3rd Attempt) Unsuccessful Call (1st Attempt) Date: 03/13/24 Unsuccessful Call (2nd Attempt) Date: 03/14/24 Unsuccessful Call (3rd Attempt) Date: 03/15/24  Attempted to reach the patient regarding the most recent Inpatient/ED visit.  Follow Up Plan: No further outreach attempts will be made at this time. We have been unable to contact the patient.  Signature Karena Addison, LPN [facility] Nurse Health Advisor Direct Dial [phone]

## 2024-03-17 ENCOUNTER — Telehealth (HOSPITAL_COMMUNITY): Payer: Self-pay | Admitting: *Deleted

## 2024-03-17 NOTE — Telephone Encounter (Signed)
 Called to confirm/remind patient of their appointment at the [facility] on 03/17/24***.   Appointment:   [x] Confirmed  [] Left mess   [] No answer/No voice mail  [] Phone not in service  Patient reminded to bring all medications and/or complete list.  Confirmed patient has transportation. Gave directions, instructed to utilize valet parking.

## 2024-03-17 NOTE — Progress Notes (Signed)
 Advanced Heart Failure Clinic Note   Date:  03/20/2024   ID:  Antonio Tapia, DOB 11-03-1981, MRN [id number]  Location: Home  Provider location: [facility] Type of Visit: Established patient  PCP:  Antonio Skeans, MD  HF Cardiologist: Bensimhon   HPI: Antonio Tapia is a 43 y.o. with history of ETOH abuse, A flutter, tobacco abuse, and chronic systolic heart failure.    Admitted 10/21 with ADHF and marked volume overload. On the day of admit he continued to decompensate with SVT.  Underwent Emergent DC-CV and intubation. Placed on Norepi + milrinone+ amio drip. Gradually improved and RHC/LHC on 09/30/20 with normal coronaries, EF 40-45% and well compensated filling pressures. D/c wt was 261 lb.    Echo 4/22 EF 60-65%, mod LVH, Grade II DD.  Last seen 11/23.  Admitted 3/25 with AFL with RVR, non compliant with meds. Echo showed EF 45-50%. Spontaneously converted to NSR. GDMT initiated and he was discharged home, weight 276 lbs.  Today he returns for post hospital HF follow up with his mother. Overall feeling fine. He has SOB doing laundry. Push mowed yard yesterday without dyspnea. Dizzy taking meds all at once but symptoms improved when he staggered doses. Denies  palpitations, abnormal bleeding, CP, edema, or PND/Orthopnea. Appetite ok. No fever or chills. Weight at home 279-283 pounds. 2-3 ETOH drinks/week. Lives in apartment. Smokes 7 cig/day. Working at [employer]. Of note, had COVID and flu at beginning of March 2025. He snores. Now insured.   Cardiac Studies - Echo 3/25: EF 45-50%, normal RV,   - Echo (4/22): EF 60-65% mod LVH G2DD   - Echo (10/21): EF 20%, RV moderately reduced   - Echo (2019): EF 30-25%, RV normal  - R/LHC (10/21)   Ao = 123/86 (104) LV = 126/8 RA =  2 RV = 33/8 PA = 32/5 (21) PCW = 4 Fick cardiac output/index = 8.2/3.3 PVR = 2.6 WU FA sat = 99% PA sat = 76%, 77%   Assessment: 1. Normal coronaries 2.  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 Well-compensated filling pressures   Past Medical History:  Diagnosis Date   Acute congestive heart failure (HCC)    Alcohol use    Atrial flutter (HCC)    Essential hypertension    Hypertension    Phreesia 10/29/2020   Tobacco use    Past Surgical History:  Procedure Laterality Date   RIGHT/LEFT HEART CATH AND CORONARY ANGIOGRAPHY N/A 09/30/2020   Procedure: RIGHT/LEFT HEART CATH AND CORONARY ANGIOGRAPHY;  Surgeon: Dolores Patty, MD;  Location: [facility];  Service: Cardiovascular;  Laterality: N/A;   Current Outpatient Medications  Medication Sig Dispense Refill   acetaminophen (TYLENOL) 500 MG tablet Take 1,500 mg by mouth every 6 (six) hours as needed for mild pain (pain score 1-3) or moderate pain (pain score 4-6).     dabigatran (PRADAXA) 150 MG CAPS capsule Take 1 capsule (150 mg total) by mouth every 12 (twelve) hours. 60 capsule 1   empagliflozin (JARDIANCE) 10 MG TABS tablet Take 1 tablet (10 mg total) by mouth daily. 30 tablet 1   furosemide (LASIX) 20 MG tablet Take 1 tablet as needed for swelling of your legs or if you gain more than 3 pounds over 1 day. 30 tablet 1   losartan (COZAAR) 50 MG tablet Take 1 tablet (50 mg total) by mouth daily. 30 tablet 1   metoprolol succinate (TOPROL-XL) 100 MG 24 hr tablet  Take 1 tablet (100 mg total) by mouth daily. Take with or immediately following a meal. 30 tablet 1   Multiple Vitamin (MULTIVITAMIN WITH MINERALS) TABS tablet Take 1 tablet by mouth daily.     spironolactone (ALDACTONE) 25 MG tablet Take 0.5 tablets (12.5 mg total) by mouth daily. 30 tablet 1   No current facility-administered medications for this encounter.   Allergies:   Patient has no known allergies.   Social History:  The patient  reports that he has been smoking. He has never used smokeless tobacco. He reports current alcohol use. He reports that he does not use drugs.   Family History:  The patient's family history  includes Breast cancer in his paternal grandfather; Diabetes in his paternal grandmother; Hypertension in his father and mother.   ROS:  Please see the history of present illness.   All other systems are personally reviewed and negative.   Wt Readings from Last 3 Encounters:  03/20/24 128.2 kg (282 lb 9.6 oz)  03/09/24 125.4 kg (276 lb 6.4 oz)  11/12/22 128.5 kg (283 lb 6.4 oz)   BP (!) 140/110   Pulse 88   Ht 6' (1.829 m)   Wt 128.2 kg (282 lb 9.6 oz)   SpO2 97%   BMI 38.33 kg/m   Physical Exam:   General:  NAD. No resp difficulty, walked into clinic, diaphoretic HEENT: Normal Neck: Supple. JVP 10 Cor: Regular rate & rhythm. No rubs, gallops or murmurs. Lungs: Clear Abdomen: Soft, nontender, nondistended.  Extremities: No cyanosis, clubbing, rash, edema Neuro: Alert & oriented x 3, moves all 4 extremities w/o difficulty. Affect pleasant.  ECG (personally reviewed): NSR, LVH 83 bpm  ReDs reading: 46 %, abnormal  ASSESSMENT AND PLAN: 1. 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  - Admit ADHF 10/21 c/b SVT leading to cardiogenic shock  - Echo (10/21): EF 20%, RV moderately reduced  - R/LHC showed normal coronaries, EF 40-45%, and compensated filling pressures. CO/CI (Fick) 8.2/3.3.  - Echo (4/22): EF 60-65 mod LVH G2DD  - Echo 3/25: EF 45-50%, normal RV - NYHA II-early III. Volume up on exam, ReDs elevated 46% - Stop losartan, start Entresto 49/51 mg bid - Start Lasix 40 mg daily + 20 KCL daily. - Continue Jardiance 10 mg daily - Continue Toprol XL 100 mg daily - Continue spiro 12.5 mg daily. - Repeat echo in 3 months to ensure EF stable. - Labs today. BMET in 10-14 days   2. SVT - Admit 10/21 w/ hemodynamically unstable SVT requiring emergent DCCV  - Off amio - NSR on ECG today.  - No change  3. PAF - Recent admission with AF with RVR - In  NSR today.  - CHADS-VASc: 2 - Continue Pradaxa 150 mg bid (consider switch to Eliquis, $4/90-day) - Continue Toprol - Has follow up with EP to discuss ablation - With young age + smoking, would like to avoid amiodarone.  4. HTN  - Not controlled. - Start Entresto as above - He has BP cuff, I asked him to check daily and log. Notify clinic if sBP>140 - Consider addition of BiDil next - Needs sleep study  5. ETOH Abuse - Has cut back  - Discussed cessation.   6. Tobacco Abuse  - Still smoking. - Discussed cessation.   7. Snoring - Arrange sleep study.  Follow up in 3 weeks with APP  and 3 months with Dr. Gala Romney + echo.  Nelva Bush, FNP-BC 03/20/2024 8:56 AM  Advanced [facility] Failure Clinic [facility] [address] [facility] [place] [address] [phone] (office) [phone] (fax)

## 2024-03-20 ENCOUNTER — Telehealth (HOSPITAL_COMMUNITY): Payer: Self-pay

## 2024-03-20 ENCOUNTER — Other Ambulatory Visit: Payer: Self-pay

## 2024-03-20 ENCOUNTER — Encounter (HOSPITAL_COMMUNITY): Payer: Self-pay

## 2024-03-20 ENCOUNTER — Ambulatory Visit (HOSPITAL_COMMUNITY)
Admission: RE | Admit: 2024-03-20 | Discharge: 2024-03-20 | Disposition: A | Discharge status: Home / Self Care | Payer: MEDICAID | Source: Ambulatory Visit | Attending: Family Medicine | Admitting: Family Medicine

## 2024-03-20 ENCOUNTER — Other Ambulatory Visit (HOSPITAL_COMMUNITY): Payer: Self-pay

## 2024-03-20 VITALS — BP 140/110 | HR 88 | Ht 72.0 in | Wt 282.6 lb

## 2024-03-20 DIAGNOSIS — I5021 Acute systolic (congestive) heart failure: Secondary | ICD-10-CM

## 2024-03-20 DIAGNOSIS — I428 Other cardiomyopathies: Secondary | ICD-10-CM | POA: Insufficient documentation

## 2024-03-20 DIAGNOSIS — Z7902 Long term (current) use of antithrombotics/antiplatelets: Secondary | ICD-10-CM | POA: Diagnosis not present

## 2024-03-20 DIAGNOSIS — Z7984 Long term (current) use of oral hypoglycemic drugs: Secondary | ICD-10-CM | POA: Insufficient documentation

## 2024-03-20 DIAGNOSIS — I5022 Chronic systolic (congestive) heart failure: Secondary | ICD-10-CM | POA: Insufficient documentation

## 2024-03-20 DIAGNOSIS — I10 Essential (primary) hypertension: Secondary | ICD-10-CM

## 2024-03-20 DIAGNOSIS — I480 Paroxysmal atrial fibrillation: Secondary | ICD-10-CM | POA: Diagnosis not present

## 2024-03-20 DIAGNOSIS — F17200 Nicotine dependence, unspecified, uncomplicated: Secondary | ICD-10-CM

## 2024-03-20 DIAGNOSIS — I110 Hypertensive heart disease with heart failure: Secondary | ICD-10-CM | POA: Insufficient documentation

## 2024-03-20 DIAGNOSIS — I471 Supraventricular tachycardia, unspecified: Secondary | ICD-10-CM | POA: Insufficient documentation

## 2024-03-20 DIAGNOSIS — F17210 Nicotine dependence, cigarettes, uncomplicated: Secondary | ICD-10-CM | POA: Diagnosis not present

## 2024-03-20 DIAGNOSIS — Z79899 Other long term (current) drug therapy: Secondary | ICD-10-CM | POA: Insufficient documentation

## 2024-03-20 DIAGNOSIS — F1010 Alcohol abuse, uncomplicated: Secondary | ICD-10-CM

## 2024-03-20 DIAGNOSIS — R0683 Snoring: Secondary | ICD-10-CM | POA: Insufficient documentation

## 2024-03-20 LAB — BASIC METABOLIC PANEL WITH GFR
Anion gap: 10 (ref 5–15)
BUN: 13 mg/dL (ref 6–20)
CO2: 26 mmol/L (ref 22–32)
Calcium: 9.2 mg/dL (ref 8.9–10.3)
Chloride: 106 mmol/L (ref 98–111)
Creatinine, Ser: 1.39 mg/dL — ABNORMAL HIGH (ref 0.61–1.24)
GFR, Estimated: >60 mL/min (ref >60)
Glucose, Bld: 94 mg/dL (ref 70–99)
Potassium: 4.2 mmol/L (ref 3.5–5.1)
Sodium: 142 mmol/L (ref 135–145)

## 2024-03-20 LAB — CBC
HCT: 46 % (ref 39.0–52.0)
Hemoglobin: 15.2 g/dL (ref 13.0–17.0)
MCH: 34.2 pg — ABNORMAL HIGH (ref 26.0–34.0)
MCHC: 33 g/dL (ref 30.0–36.0)
MCV: 103.6 fL — ABNORMAL HIGH (ref 80.0–100.0)
Platelets: 272 10*3/uL (ref 150–400)
RBC: 4.44 MIL/uL (ref 4.22–5.81)
RDW: 12.5 % (ref 11.5–15.5)
WBC: 4.5 10*3/uL (ref 4.0–10.5)
nRBC: 0 % (ref 0.0–0.2)

## 2024-03-20 LAB — BRAIN NATRIURETIC PEPTIDE: B Natriuretic Peptide: 1035.4 pg/mL — ABNORMAL HIGH (ref 0.0–100.0)

## 2024-03-20 MED ORDER — POTASSIUM CHLORIDE CRYS ER 20 MEQ PO TBCR
20.0000 meq | EXTENDED_RELEASE_TABLET | Freq: Every day | ORAL | 3 refills | Status: AC
  Filled 2024-03-20: qty 90, 90d supply, fill #0
  Filled 2024-07-27: qty 90, 90d supply, fill #1
  Filled 2024-11-03: qty 90, 90d supply, fill #2

## 2024-03-20 MED ORDER — EMPAGLIFLOZIN 10 MG PO TABS
10.0000 mg | ORAL_TABLET | Freq: Every day | ORAL | 3 refills | Status: AC
  Filled 2024-03-20 – 2024-04-17 (×2): qty 90, 90d supply, fill #0
  Filled 2024-07-27: qty 90, 90d supply, fill #1

## 2024-03-20 MED ORDER — SPIRONOLACTONE 25 MG PO TABS
12.5000 mg | ORAL_TABLET | Freq: Every day | ORAL | 3 refills | Status: AC
  Filled 2024-03-20 – 2024-04-17 (×2): qty 45, 90d supply, fill #0
  Filled 2024-11-03: qty 45, 90d supply, fill #1

## 2024-03-20 MED ORDER — METOPROLOL SUCCINATE ER 100 MG PO TB24
100.0000 mg | ORAL_TABLET | Freq: Every day | ORAL | 3 refills | Status: AC
  Filled 2024-03-20 – 2024-04-17 (×2): qty 90, 90d supply, fill #0
  Filled 2024-07-27: qty 90, 90d supply, fill #1

## 2024-03-20 MED ORDER — ENTRESTO 49-51 MG PO TABS
1.0000 | ORAL_TABLET | Freq: Two times a day (BID) | ORAL | 3 refills | Status: AC
  Filled 2024-03-20: qty 180, 90d supply, fill #0
  Filled 2024-07-27: qty 180, 90d supply, fill #1

## 2024-03-20 MED ORDER — DABIGATRAN ETEXILATE MESYLATE 150 MG PO CAPS
150.0000 mg | ORAL_CAPSULE | Freq: Two times a day (BID) | ORAL | 3 refills | Status: AC
  Filled 2024-03-20: qty 180, 90d supply, fill #0
  Filled 2024-07-27: qty 180, 90d supply, fill #1

## 2024-03-20 MED ORDER — FUROSEMIDE 20 MG PO TABS
40.0000 mg | ORAL_TABLET | Freq: Every day | ORAL | 3 refills | Status: AC
  Filled 2024-03-20: qty 90, 45d supply, fill #0

## 2024-03-20 NOTE — Telephone Encounter (Addendum)
 Advanced Heart Failure Patient Advocate Encounter  Test billing for Sherryll Burger returns $4 for 90 day supply on the current coverage. London Pepper and Marcelline Deist would both require prior authorization.  Eliquis and Pradaxa both return $4 copay for 90 day supply each.  Burnell Blanks, CPhT Rx Patient Advocate Phone: [phone]

## 2024-03-20 NOTE — Telephone Encounter (Signed)
 Patient now has active insurance that requires prior authorization for Jardiance. PA submitted and approved, medication assistance not needed at this time. Will be available for future needs.

## 2024-03-20 NOTE — Telephone Encounter (Signed)
 Patient now has active coverage. Test billing for Sherryll Burger returns $4 copay for 90 days. Medication assistance not needed at this time. Will be available for future needs.

## 2024-03-20 NOTE — Progress Notes (Signed)
 ReDS Vest / Clip - 03/20/24 0920       ReDS Vest / Clip   Station Marker D    Ruler Value 34    ReDS Value Range High volume overload    ReDS Actual Value 46    Anatomical Comments sitting

## 2024-03-20 NOTE — Telephone Encounter (Signed)
 Advanced Heart Failure Patient Advocate Encounter  Prior authorization for London Pepper has been submitted and approved. Test billing returns $4 for 90 day supply.  KeyChauncey Reading Effective: 03/20/2024 to 03/20/2025  Burnell Blanks, CPhT Rx Patient Advocate Phone: [phone]

## 2024-03-20 NOTE — Patient Instructions (Addendum)
 Thank you for coming in today  If you had labs drawn today, any labs that are abnormal the clinic will call you No news is good news  Please check blood pressure at home and call clinic if Blood pressure is over 140 Systolic blood pressure   You have been order for sleep study [facility] will call you to make appointment    Medications: Stop Losartan Start Entresto 49/51 1 tablet twice daily Start Lasix 40 mg daily Start Potassium 20 meq daily $4 co pays for Raechel Chute, Jardiance, Pradaxa for 90 day supply  Follow up appointments:  Your physician recommends that you schedule a follow-up appointment in:  3 weeks in clinic  3 months With Dr. Gala Romney with echocardiogram Please call our office to schedule the follow-up appointment in End of May/June 2025.   Do the following things EVERYDAY: Weigh yourself in the morning before breakfast. Write it down and keep it in a log. Take your medicines as prescribed Eat low salt foods--Limit salt (sodium) to 2000 mg per day.  Stay as active as you can everyday Limit all fluids for the day to less than 2 liters   At the [facility] Clinic, you and your health needs are our priority. As part of our continuing mission to provide you with exceptional heart care, we have created designated Provider Care Teams. These Care Teams include your primary Cardiologist (physician) and Advanced Practice Providers (APPs- Physician Assistants and Nurse Practitioners) who all work together to provide you with the care you need, when you need it.   You may see any of the following providers on your designated Care Team at your next follow up: Dr Arvilla Meres Dr Marca Ancona Dr. Marcos Eke, NP Robbie Lis, [place] [facility] [place], [place] Brynda Peon, NP Karle Plumber, PharmD   Please be sure to bring in all your medications bottles to every appointment.    Thank you for choosing [facility]  If you have any questions or concerns before your next appointment please send [place] a message through [place] or call our office at [phone].    TO LEAVE A MESSAGE FOR THE NURSE SELECT OPTION 2, PLEASE LEAVE A MESSAGE INCLUDING: YOUR NAME DATE OF BIRTH CALL BACK NUMBER REASON FOR CALL**this is important as we prioritize the call backs  YOU WILL RECEIVE A CALL BACK THE SAME DAY AS LONG AS YOU CALL BEFORE 4:00 PM

## 2024-03-21 ENCOUNTER — Other Ambulatory Visit: Payer: Self-pay

## 2024-03-27 ENCOUNTER — Encounter (INDEPENDENT_AMBULATORY_CARE_PROVIDER_SITE_OTHER): Payer: Self-pay

## 2024-03-27 ENCOUNTER — Telehealth (INDEPENDENT_AMBULATORY_CARE_PROVIDER_SITE_OTHER): Payer: Self-pay | Admitting: Primary Care

## 2024-03-27 ENCOUNTER — Inpatient Hospital Stay (INDEPENDENT_AMBULATORY_CARE_PROVIDER_SITE_OTHER): Payer: Self-pay | Admitting: Primary Care

## 2024-03-27 NOTE — Telephone Encounter (Signed)
 Called pt to see if we could reschedule missed appt from today (03/27/24). Pt can call us  back to schedule. Please advise.

## 2024-04-03 ENCOUNTER — Encounter: Payer: Self-pay | Admitting: Family Medicine

## 2024-04-03 ENCOUNTER — Ambulatory Visit (INDEPENDENT_AMBULATORY_CARE_PROVIDER_SITE_OTHER): Admitting: Family Medicine

## 2024-04-03 VITALS — BP 147/10 | HR 81 | Ht 72.0 in | Wt 273.4 lb

## 2024-04-03 DIAGNOSIS — F17200 Nicotine dependence, unspecified, uncomplicated: Secondary | ICD-10-CM

## 2024-04-03 DIAGNOSIS — F419 Anxiety disorder, unspecified: Secondary | ICD-10-CM | POA: Diagnosis not present

## 2024-04-03 DIAGNOSIS — Z09 Encounter for follow-up examination after completed treatment for conditions other than malignant neoplasm: Secondary | ICD-10-CM | POA: Diagnosis not present

## 2024-04-03 DIAGNOSIS — E66812 Obesity, class 2: Secondary | ICD-10-CM | POA: Diagnosis not present

## 2024-04-03 DIAGNOSIS — Z6837 Body mass index (BMI) 37.0-37.9, adult: Secondary | ICD-10-CM

## 2024-04-03 DIAGNOSIS — F32A Depression, unspecified: Secondary | ICD-10-CM

## 2024-04-03 DIAGNOSIS — I10 Essential (primary) hypertension: Secondary | ICD-10-CM | POA: Diagnosis not present

## 2024-04-04 ENCOUNTER — Ambulatory Visit: Payer: Self-pay | Attending: Cardiology | Admitting: Cardiology

## 2024-04-04 ENCOUNTER — Encounter: Payer: Self-pay | Admitting: Cardiology

## 2024-04-04 VITALS — BP 154/96 | HR 70 | Ht 72.0 in | Wt 275.6 lb

## 2024-04-04 DIAGNOSIS — Z01812 Encounter for preprocedural laboratory examination: Secondary | ICD-10-CM | POA: Diagnosis not present

## 2024-04-04 DIAGNOSIS — I471 Supraventricular tachycardia, unspecified: Secondary | ICD-10-CM

## 2024-04-04 DIAGNOSIS — I483 Typical atrial flutter: Secondary | ICD-10-CM

## 2024-04-04 DIAGNOSIS — I5022 Chronic systolic (congestive) heart failure: Secondary | ICD-10-CM | POA: Diagnosis not present

## 2024-04-04 NOTE — Patient Instructions (Signed)
 Medication Instructions:  Your physician recommends that you continue on your current medications as directed. Please refer to the Current Medication list given to you today.  *If you need a refill on your cardiac medications before your next appointment, please call your pharmacy*   Lab Work: None ordered   Testing/Procedures: Your physician has recommended that you have an ablation. Catheter ablation is a medical procedure used to treat some cardiac arrhythmias (irregular heartbeats). During catheter ablation, a long, thin, flexible tube is put into a blood vessel in your groin (upper thigh), or neck. This tube is called an ablation catheter. It is then guided to your heart through the blood vessel. Radio frequency waves destroy small areas of heart tissue where abnormal heartbeats may cause an arrhythmia to start.   You are scheduled for 06/29/2024, arrive at 8:30 am.  We will be in contact at a later date to go over instructions.   Follow-Up: At [facility], you and your health needs are our priority.  As part of our continuing mission to provide you with exceptional heart care, we have created designated Provider Care Teams.  These Care Teams include your primary Cardiologist (physician) and Advanced Practice Providers (APPs -  Physician Assistants and Nurse Practitioners) who all work together to provide you with the care you need, when you need it.  Your next appointment:   4 week(s)  The format for your next appointment:   In Person  Provider:   Agatha Horsfall, MD\    Thank you for choosing [facility]!!   Reece Cane, RN [phone]  Other Instructions  Cardiac Ablation Cardiac ablation is a procedure to destroy (ablate) heart tissue that is sending bad signals. These bad signals cause the heart to beat very fast or in a way that is not normal. Destroying some tissues can help make the heart rhythm normal. Tell your doctor about: Any allergies you have. All  medicines you are taking. These include vitamins, herbs, eye drops, creams, and over-the-counter medicines. Any problems you or family members have had with anesthesia. Any bleeding problems you have. Any surgeries you have had. Any medical conditions you have. Whether you are pregnant or may be pregnant. What are the risks? Your doctor will talk with you about risks. These may include: Infection. Bruising and bleeding. Stroke or blood clots. Damage to nearby areas of your body. Allergies to medicines or dyes. Needing a pacemaker if the heart gets damaged. A pacemaker helps the heart beat normally. The procedure not working. What happens before the procedure? Medicines Ask your doctor about changing or stopping: Your normal medicines. Vitamins, herbs, and supplements. Over-the-counter medicines. Do not take aspirin  or ibuprofen  unless you are told to. General instructions Follow instructions from your doctor about what you may eat and drink. If you will be going home right after the procedure, plan to have a responsible adult: Take you home from the hospital or clinic. You will not be allowed to drive. Care for you for the time you are told. Ask your doctor what steps will be taken to prevent the spread of germs. What happens during the procedure?  An IV tube will be put into one of your veins. You may be given: A sedative. This helps you relax. Anesthesia. This will: Numb certain areas of your body. The skin on your neck or groin will be numbed. A cut (incision) will be made in your neck or groin. A needle will be put through the cut and into  a large vein. The small, thin tube (catheter) will be put into the needle. The tube will be moved to your heart. A type of X-ray (fluoroscopy) will be used to help guide the tube. It will also show constant images of the heart on a screen. Dye may be put through the tube. This helps your doctor see your heart. An electric current will  be sent from the tube to destroy heart tissue in certain areas. The tube will be taken out. Pressure will be held on your cut. This helps stop bleeding. A bandage (dressing) will be put over your cut. The procedure may vary among doctors and hospitals. What happens after the procedure? You will be monitored until you leave the hospital or clinic. This includes checking your blood pressure, heart rate and rhythm, breathing rate, and blood oxygen level. Your cut will be checked for bleeding. You will need to lie still for a few hours. If your groin was used, you will need to keep your leg straight for a few hours after the small, thin tube is removed. This information is not intended to replace advice given to you by your health care provider. Make sure you discuss any questions you have with your health care provider. Document Revised: 05/19/2022 Document Reviewed: 05/19/2022 Elsevier Patient Education  2024 [employer].

## 2024-04-04 NOTE — Progress Notes (Unsigned)
 Electrophysiology Office Note:   Date:  04/04/2024  ID:  Dewitt Forehand, DOB 12-28-1980, MRN [id number]  Primary Cardiologist: Jules Oar, MD Primary Heart Failure: None Electrophysiologist: None  {Click to update primary MD,subspecialty MD or APP then REFRESH:1}    History of Present Illness:   Legrand Lasser is a 43 y.o. male with h/o alcohol abuse, atrial flutter, tobacco abuse, chronic systolic heart failure seen today for  for Electrophysiology evaluation of atrial flutter at the request of [facility].    He was admitted to the hospital in 2021 with acute heart failure and volume overload.  He had SVT and emergently decompensated.  He had cardioversion.  He was started on amiodarone , milrinone , norepinephrine .  Ejection fraction was 40 to 45%.  He was again admitted to the hospital March 2025 with atrial flutter.  He spontaneously converted to sinus rhythm and was discharged home.  Today, denies symptoms of palpitations, chest pain, shortness of breath, orthopnea, PND, lower extremity edema, claudication, dizziness, presyncope, syncope, bleeding, or neurologic sequela. The patient is tolerating medications without difficulties.  Since his hospitalization in March he is doing well.  He has noted no further episodes of atrial flutter or SVT.  Review of systems complete and found to be negative unless listed in HPI.   EP Information / Studies Reviewed:    EKG is ordered today. Personal review as below.        Risk Assessment/Calculations:    CHA2DS2-VASc Score = 2  {Click here to calculate score.  REFRESH note before signing. :1} This indicates a 2.2% annual risk of stroke. The patient's score is based upon: CHF History: 1 HTN History: 1 Diabetes History: 0 Stroke History: 0 Vascular Disease History: 0 Age Score: 0 Gender Score: 0   {This patient has a significant risk of stroke if diagnosed with atrial fibrillation.  Please consider VKA or DOAC agent for  anticoagulation if the bleeding risk is acceptable.   You can also use the SmartPhrase .[email or web address] for documentation.   :[id number]}         Physical Exam:   VS:  BP (!) 154/96 (BP Location: Left Arm, Patient Position: Sitting, Cuff Size: Large)   Pulse 70   Ht 6' (1.829 m)   Wt 275 lb 9.6 oz (125 kg)   SpO2 97%   BMI 37.38 kg/m    Wt Readings from Last 3 Encounters:  04/04/24 275 lb 9.6 oz (125 kg)  04/03/24 273 lb 6.4 oz (124 kg)  03/20/24 282 lb 9.6 oz (128.2 kg)     GEN: Well nourished, well developed in no acute distress NECK: No JVD; No carotid bruits CARDIAC: Regular rate and rhythm, no murmurs, rubs, gallops RESPIRATORY:  Clear to auscultation without rales, wheezing or rhonchi  ABDOMEN: Soft, non-tender, non-distended EXTREMITIES:  No edema; No deformity   ASSESSMENT AND PLAN:    1.  Atrial flutter: Admission to the hospital with atrial flutter.  Currently on Pradaxa  and metoprolol .  He feels well currently, but does have symptoms into atrial flutter.  He would benefit from ablation.  Risks and benefits have been discussed.  He understands the risk and is agreed to the procedure.  2.  SVT: Had episode of SVT in March 2025.  During the procedure for atrial flutter ablation, we Sutton Plake do a full EP study to see if SVT can be induced.  If so, we Valaria Kohut plan for ablation at that time.  Again risks and benefits have been discussed.  Risk  of bleeding, tamponade, heart block, stroke, MI, renal failure, death.  He understands these risks and is agreed to the procedure.  3.  Chronic systolic heart failure: Ejection fraction 40 to 45%.  On medical therapy per heart failure cardiology.  4.  Hypertension: Mildly elevated today.  On blood pressure medications per heart failure cardiology.  5.  Alcohol abuse: Complete cessation encouraged  6.  Tobacco abuse: Complete cessation encouraged  Follow up with Dr. Lawana Pray as usual post procedure  Signed, Nonna Renninger Cortland Ding, MD

## 2024-04-06 ENCOUNTER — Encounter: Payer: Self-pay | Admitting: Family Medicine

## 2024-04-06 NOTE — Progress Notes (Signed)
 Established Patient Office Visit  Subjective    Patient ID: Antonio Tapia, male    DOB: [birth year]  Age: 43 y.o. MRN: [id number]  CC:  Chief Complaint  Patient presents with   Hospitalization Follow-up    Go over working/disability    Numbness    Both hand and feets    HPI Antonio Tapia presents for routine hospital discharge follow up with dx including of CHF, atrial flutter, and AKI. Patient reports much improvement since d/c and does have scheduled follow ups with cardiology.   Outpatient Encounter Medications as of 04/03/2024  Medication Sig   acetaminophen  (TYLENOL ) 500 MG tablet Take 1,500 mg by mouth every 6 (six) hours as needed for mild pain (pain score 1-3) or moderate pain (pain score 4-6).   dabigatran  (PRADAXA ) 150 MG CAPS capsule Take 1 capsule (150 mg total) by mouth every 12 (twelve) hours.   empagliflozin  (JARDIANCE ) 10 MG TABS tablet Take 1 tablet (10 mg total) by mouth daily.   furosemide  (LASIX ) 20 MG tablet Take 2 tablets (40 mg total) by mouth daily. Take 1 tablet as needed for swelling of your legs or if you gain more than 3 pounds over 1 day.   metoprolol  succinate (TOPROL -XL) 100 MG 24 hr tablet Take 1 tablet (100 mg total) by mouth daily. Take with or immediately following a meal.   Multiple Vitamin (MULTIVITAMIN WITH MINERALS) TABS tablet Take 1 tablet by mouth daily.   potassium chloride  SA (KLOR-CON  M) 20 MEQ tablet Take 1 tablet (20 mEq total) by mouth daily.   sacubitril -valsartan  (ENTRESTO ) 49-51 MG Take 1 tablet by mouth 2 (two) times daily.   spironolactone  (ALDACTONE ) 25 MG tablet Take 0.5 tablets (12.5 mg total) by mouth daily.   No facility-administered encounter medications on file as of 04/03/2024.    Past Medical History:  Diagnosis Date   Acute congestive heart failure (HCC)    Alcohol use    Atrial flutter (HCC)    Essential hypertension    Hypertension    Phreesia 10/29/2020   Tobacco use     Past Surgical History:   Procedure Laterality Date   RIGHT/LEFT HEART CATH AND CORONARY ANGIOGRAPHY N/A 09/30/2020   Procedure: RIGHT/LEFT HEART CATH AND CORONARY ANGIOGRAPHY;  Surgeon: Mardell Shade, MD;  Location: [facility];  Service: Cardiovascular;  Laterality: N/A;    Family History  Problem Relation Age of Onset   Hypertension Mother    Hypertension Father    Diabetes Paternal Grandmother    Breast cancer Paternal Grandfather     Social History   Socioeconomic History   Marital status: Single    Spouse name: Not on file   Number of children: Not on file   Years of education: Not on file   Highest education level: Not on file  Occupational History   Not on file  Tobacco Use   Smoking status: Every Day   Smokeless tobacco: Never  Substance and Sexual Activity   Alcohol use: Yes    Comment: 1 pint liquor/day   Drug use: No   Sexual activity: Not on file  Other Topics Concern   Not on file  Social History Narrative   Not on file   Social Drivers of Health   Financial Resource Strain: Low Risk  (09/17/2020)   Overall Financial Resource Strain (CARDIA)    Difficulty of Paying Living Expenses: Not very hard  Food Insecurity: No Food Insecurity (03/09/2024)   Hunger Vital Sign  Worried About Programme researcher, broadcasting/film/video in the Last Year: Never true    Ran Out of Food in the Last Year: Never true  Transportation Needs: No Transportation Needs (03/09/2024)   PRAPARE - Administrator, Civil Service (Medical): No    Lack of Transportation (Non-Medical): No  Physical Activity: Not on file  Stress: Not on file  Social Connections: Not on file  Intimate Partner Violence: Not At Risk (03/09/2024)   Humiliation, Afraid, Rape, and Kick questionnaire    Fear of Current or Ex-Partner: No    Emotionally Abused: No    Physically Abused: No    Sexually Abused: No    Review of Systems  All other systems reviewed and are negative.       Objective    BP (!) 147/10 (BP  Location: Left Arm, Patient Position: Sitting, Cuff Size: Large)   Pulse 81   Ht 6' (1.829 m)   Wt 273 lb 6.4 oz (124 kg)   SpO2 97%   BMI 37.08 kg/m   Physical Exam Vitals and nursing note reviewed.  Constitutional:      General: He is not in acute distress. Cardiovascular:     Rate and Rhythm: Normal rate and regular rhythm.  Pulmonary:     Effort: Pulmonary effort is normal.     Breath sounds: Normal breath sounds.  Abdominal:     Palpations: Abdomen is soft.     Tenderness: There is no abdominal tenderness.  Musculoskeletal:     Right lower leg: No edema.     Left lower leg: No edema.  Neurological:     General: No focal deficit present.     Mental Status: He is alert and oriented to person, place, and time.         Assessment & Plan:   Essential hypertension  Anxiety and depression  Smoker  Class 2 severe obesity due to excess calories with serious comorbidity and body mass index (BMI) of 37.0 to 37.9 in adult [facility])  Hospital discharge follow-up     No follow-ups on file.   Arlo Lama, MD

## 2024-04-10 ENCOUNTER — Telehealth (HOSPITAL_COMMUNITY): Payer: Self-pay

## 2024-04-10 NOTE — Telephone Encounter (Signed)
 Called to confirm/remind patient of their appointment [facility] on 04/11/24.   Appointment:   [] Confirmed  [x] Left mess   [] No answer/No voice mail  [] VM Full/unable to leave message  [] Phone not in service  And to bring in all medications and/or complete list.

## 2024-04-11 ENCOUNTER — Encounter (HOSPITAL_COMMUNITY): Payer: Self-pay

## 2024-04-14 ENCOUNTER — Ambulatory Visit (HOSPITAL_BASED_OUTPATIENT_CLINIC_OR_DEPARTMENT_OTHER): Attending: Family Medicine | Admitting: Cardiology

## 2024-04-17 ENCOUNTER — Other Ambulatory Visit: Payer: Self-pay

## 2024-04-19 ENCOUNTER — Other Ambulatory Visit: Payer: Self-pay

## 2024-05-19 ENCOUNTER — Other Ambulatory Visit: Payer: Self-pay

## 2024-05-19 DIAGNOSIS — I471 Supraventricular tachycardia, unspecified: Secondary | ICD-10-CM

## 2024-05-19 DIAGNOSIS — I483 Typical atrial flutter: Secondary | ICD-10-CM

## 2024-05-19 DIAGNOSIS — Z01812 Encounter for preprocedural laboratory examination: Secondary | ICD-10-CM

## 2024-05-31 ENCOUNTER — Telehealth: Payer: Self-pay

## 2024-05-31 NOTE — Telephone Encounter (Signed)
 LM for pt to call back - I am offering to move his AFL/SVT ablation with Dr. Lawana Pray up from 7/14 to 7/3 at 1000.

## 2024-06-15 ENCOUNTER — Telehealth (HOSPITAL_COMMUNITY): Payer: Self-pay

## 2024-06-15 NOTE — Telephone Encounter (Signed)
 Spoke with patient to discuss upcoming procedure.   Labs: advised to be completed by July 8.   Any recent signs of acute illness or been started on antibiotics? No Any new medications started? No Any medications to hold?  Hold Jardiance  and Metoprolol  for 3 days prior to the procedure- last dose on July 10. Any missed doses of blood thinner? Patient denies missing any within the past 4 weeks.   Advised patient to continue taking ANTICOAGULANT: Pradaxa  (Dabigatran ) twice daily without missing any doses and discussed measures to assist him with medication compliance.   Medication instructions:  On the morning of your procedure DO NOT take any medication., including Pradaxa  or the procedure may be rescheduled. Nothing to eat or drink after midnight prior to your procedure.  Confirmed patient is scheduled for Atrial Flutter Ablation, SVT Ablationon Monday, July 14 with Dr. Soyla Norton. Instructed patient to arrive at the Main Entrance A at [facility]: [address] [place], [place] [address] and check in at Admitting at 10:30 AM.  Advised of plan to go home the same day and will only stay overnight if medically necessary. You MUST have a responsible adult to drive you home and MUST be with you the first 24 hours after you arrive home or your procedure could be cancelled.  Patient verbalized understanding to all instructions provided and agreed to proceed with procedure.

## 2024-06-22 ENCOUNTER — Telehealth: Payer: Self-pay

## 2024-06-22 NOTE — Telephone Encounter (Signed)
 LM on pt's VM informing him of time change for his procedure on Monday 7/14. He is to arrive at 8:00 AM at [facility].

## 2024-06-23 NOTE — Pre-Procedure Instructions (Signed)
 Instructed patient on the following items: Arrival time 0800 Nothing to eat or drink after midnight No meds AM of procedure Responsible person to drive you home and stay with you for 24 hrs  Have you missed any doses of anti-coagulant Pradaxa - takes twice a day, has missed a dose or two.  Don't take morning of procedure.  Will notify Dr Inocencio of missed doses.

## 2024-06-25 NOTE — Anesthesia Preprocedure Evaluation (Signed)
 Anesthesia Evaluation  Patient identified by MRN, date of birth, ID band Patient awake    Reviewed: Allergy & Precautions, NPO status , Patient's Chart, lab work & pertinent test results, reviewed documented beta blocker date and time   History of Anesthesia Complications Negative for: history of anesthetic complications  Airway Mallampati: III  TM Distance: >3 FB     Dental  (+) Poor Dentition, Missing, Chipped, Dental Advisory Given   Pulmonary shortness of breath and with exertion, Current Smoker   breath sounds clear to auscultation + decreased breath sounds      Cardiovascular Exercise Tolerance: Poor hypertension, Pt. on home beta blockers and Pt. on medications (-) angina +CHF  (-) Past MI Normal cardiovascular exam+ dysrhythmias Atrial Fibrillation and Supra Ventricular Tachycardia  Rhythm:Regular Rate:Normal  TTE 03/10/24: EF 45-50%, global hypokinesis, grade III DD, interventricular septum is flattened in diastole ('D' shaped left ventricle), consistent with RV volume overload, mild MR     Neuro/Psych  PSYCHIATRIC DISORDERS Anxiety     negative neurological ROS     GI/Hepatic negative GI ROS,,,(+)     substance abuse  alcohol use  Endo/Other  negative endocrine ROS    Renal/GU Renal InsufficiencyRenal disease  negative genitourinary   Musculoskeletal negative musculoskeletal ROS (+)    Abdominal  (+) + obese  Peds  Hematology negative hematology ROS (+) Pradaxa  therapy- last dose 7/12   Anesthesia Other Findings Day of surgery medications reviewed with patient.  Reproductive/Obstetrics                              Anesthesia Physical Anesthesia Plan  ASA: 3  Anesthesia Plan: MAC   Post-op Pain Management: Minimal or no pain anticipated   Induction: Intravenous  PONV Risk Score and Plan: 2 and Treatment may vary due to age or medical condition, Ondansetron , Dexamethasone   and Midazolam   Airway Management Planned: Natural Airway and Nasal Cannula  Additional Equipment: None  Intra-op Plan:   Post-operative Plan: Extubation in OR  Informed Consent: I have reviewed the patients History and Physical, chart, labs and discussed the procedure including the risks, benefits and alternatives for the proposed anesthesia with the patient or authorized representative who has indicated his/her understanding and acceptance.     Dental advisory given  Plan Discussed with: CRNA and Anesthesiologist  Anesthesia Plan Comments:          Anesthesia Quick Evaluation

## 2024-06-26 ENCOUNTER — Ambulatory Visit (HOSPITAL_COMMUNITY)
Admission: RE | Admit: 2024-06-26 | Discharge: 2024-06-26 | Disposition: A | Discharge status: Home / Self Care | Attending: Cardiology | Admitting: Cardiology

## 2024-06-26 ENCOUNTER — Ambulatory Visit (HOSPITAL_COMMUNITY)
Admission: RE | Disposition: A | Discharge status: Home / Self Care | Payer: Self-pay | Source: Home / Self Care | Attending: Cardiology

## 2024-06-26 ENCOUNTER — Ambulatory Visit (HOSPITAL_COMMUNITY): Payer: Self-pay | Admitting: Anesthesiology

## 2024-06-26 ENCOUNTER — Other Ambulatory Visit: Payer: Self-pay

## 2024-06-26 DIAGNOSIS — I5022 Chronic systolic (congestive) heart failure: Secondary | ICD-10-CM | POA: Insufficient documentation

## 2024-06-26 DIAGNOSIS — I110 Hypertensive heart disease with heart failure: Secondary | ICD-10-CM | POA: Diagnosis not present

## 2024-06-26 DIAGNOSIS — I471 Supraventricular tachycardia, unspecified: Secondary | ICD-10-CM

## 2024-06-26 DIAGNOSIS — Z79899 Other long term (current) drug therapy: Secondary | ICD-10-CM | POA: Diagnosis not present

## 2024-06-26 DIAGNOSIS — I4892 Unspecified atrial flutter: Secondary | ICD-10-CM

## 2024-06-26 DIAGNOSIS — Z720 Tobacco use: Secondary | ICD-10-CM | POA: Insufficient documentation

## 2024-06-26 DIAGNOSIS — I5021 Acute systolic (congestive) heart failure: Secondary | ICD-10-CM | POA: Diagnosis not present

## 2024-06-26 DIAGNOSIS — Z7901 Long term (current) use of anticoagulants: Secondary | ICD-10-CM | POA: Diagnosis not present

## 2024-06-26 HISTORY — PX: A-FLUTTER ABLATION: EP1230

## 2024-06-26 HISTORY — PX: SVT ABLATION: EP1225

## 2024-06-26 LAB — BASIC METABOLIC PANEL WITH GFR
Anion gap: 9 (ref 5–15)
BUN: 9 mg/dL (ref 6–20)
CO2: 25 mmol/L (ref 22–32)
Calcium: 9.1 mg/dL (ref 8.9–10.3)
Chloride: 105 mmol/L (ref 98–111)
Creatinine, Ser: 1.19 mg/dL (ref 0.61–1.24)
GFR, Estimated: >60 mL/min (ref >60)
Glucose, Bld: 100 mg/dL — ABNORMAL HIGH (ref 70–99)
Potassium: 3.8 mmol/L (ref 3.5–5.1)
Sodium: 139 mmol/L (ref 135–145)

## 2024-06-26 LAB — CBC
HCT: 43.6 % (ref 39.0–52.0)
Hemoglobin: 15 g/dL (ref 13.0–17.0)
MCH: 34.9 pg — ABNORMAL HIGH (ref 26.0–34.0)
MCHC: 34.4 g/dL (ref 30.0–36.0)
MCV: 101.4 fL — ABNORMAL HIGH (ref 80.0–100.0)
Platelets: 220 K/uL (ref 150–400)
RBC: 4.3 MIL/uL (ref 4.22–5.81)
RDW: 13.2 % (ref 11.5–15.5)
WBC: 4.2 K/uL (ref 4.0–10.5)
nRBC: 0 % (ref 0.0–0.2)

## 2024-06-26 SURGERY — A-FLUTTER ABLATION
Anesthesia: Monitor Anesthesia Care

## 2024-06-26 MED ORDER — PHENYLEPHRINE 80 MCG/ML (10ML) SYRINGE FOR IV PUSH (FOR BLOOD PRESSURE SUPPORT)
PREFILLED_SYRINGE | INTRAVENOUS | Status: DC | PRN
  Administered 2024-06-26 (×2): 120 ug via INTRAVENOUS

## 2024-06-26 MED ORDER — ISOPROTERENOL HCL 0.2 MG/ML IJ SOLN
INTRAMUSCULAR | Status: AC
  Filled 2024-06-26: qty 5

## 2024-06-26 MED ORDER — SODIUM CHLORIDE 0.9 % IV SOLN: 250.0000 mL | INTRAVENOUS | Status: DC | PRN

## 2024-06-26 MED ORDER — SODIUM CHLORIDE 0.9 % IV SOLN: INTRAVENOUS | Status: DC

## 2024-06-26 MED ORDER — FENTANYL CITRATE (PF) 100 MCG/2ML IJ SOLN
INTRAMUSCULAR | Status: AC
  Filled 2024-06-26: qty 2

## 2024-06-26 MED ORDER — PROPOFOL 1000 MG/100ML IV EMUL
INTRAVENOUS | Status: AC
  Filled 2024-06-26: qty 100

## 2024-06-26 MED ORDER — ONDANSETRON HCL 4 MG/2ML IJ SOLN: 4.0000 mg | Freq: Four times a day (QID) | INTRAMUSCULAR | Status: DC | PRN

## 2024-06-26 MED ORDER — DEXMEDETOMIDINE HCL IN NACL 80 MCG/20ML IV SOLN
INTRAVENOUS | Status: DC | PRN
Start: 2024-06-26 — End: 2024-06-26
  Administered 2024-06-26 (×2): 8 ug via INTRAVENOUS

## 2024-06-26 MED ORDER — ONDANSETRON HCL 4 MG/2ML IJ SOLN
INTRAMUSCULAR | Status: DC | PRN
  Administered 2024-06-26: 4 mg via INTRAVENOUS

## 2024-06-26 MED ORDER — HEPARIN (PORCINE) IN NACL 1000-0.9 UT/500ML-% IV SOLN
INTRAVENOUS | Status: DC | PRN
  Administered 2024-06-26: 500 mL

## 2024-06-26 MED ORDER — MIDAZOLAM HCL 2 MG/2ML IJ SOLN
INTRAMUSCULAR | Status: DC | PRN
  Administered 2024-06-26: 2 mg via INTRAVENOUS

## 2024-06-26 MED ORDER — PHENYLEPHRINE HCL-NACL 20-0.9 MG/250ML-% IV SOLN
INTRAVENOUS | Status: DC | PRN
  Administered 2024-06-26: 30 ug/min via INTRAVENOUS

## 2024-06-26 MED ORDER — DEXAMETHASONE SODIUM PHOSPHATE 10 MG/ML IJ SOLN
INTRAMUSCULAR | Status: DC | PRN
  Administered 2024-06-26: 5 mg via INTRAVENOUS

## 2024-06-26 MED ORDER — LIDOCAINE 2% (20 MG/ML) 5 ML SYRINGE
INTRAMUSCULAR | Status: DC | PRN
  Administered 2024-06-26: 100 mg via INTRAVENOUS

## 2024-06-26 MED ORDER — BUPIVACAINE HCL (PF) 0.25 % IJ SOLN
INTRAMUSCULAR | Status: AC
  Filled 2024-06-26: qty 30

## 2024-06-26 MED ORDER — PROPOFOL 500 MG/50ML IV EMUL
INTRAVENOUS | Status: DC | PRN
  Administered 2024-06-26: 125 ug/kg/min via INTRAVENOUS

## 2024-06-26 MED ORDER — ACETAMINOPHEN 325 MG PO TABS: 650.0000 mg | ORAL_TABLET | ORAL | Status: DC | PRN

## 2024-06-26 MED ORDER — FENTANYL CITRATE (PF) 250 MCG/5ML IJ SOLN
INTRAMUSCULAR | Status: DC | PRN
  Administered 2024-06-26: 100 ug via INTRAVENOUS
  Administered 2024-06-26: 25 ug via INTRAVENOUS

## 2024-06-26 MED ORDER — PROPOFOL 10 MG/ML IV BOLUS
INTRAVENOUS | Status: DC | PRN
  Administered 2024-06-26: 20 mg via INTRAVENOUS
  Administered 2024-06-26: 40 mg via INTRAVENOUS
  Administered 2024-06-26 (×2): 20 mg via INTRAVENOUS
  Administered 2024-06-26: 30 mg via INTRAVENOUS

## 2024-06-26 MED ORDER — MIDAZOLAM HCL 2 MG/2ML IJ SOLN
INTRAMUSCULAR | Status: AC
  Filled 2024-06-26: qty 2

## 2024-06-26 MED ORDER — BUPIVACAINE HCL (PF) 0.25 % IJ SOLN
INTRAMUSCULAR | Status: DC | PRN
Start: 2024-06-26 — End: 2024-06-26
  Administered 2024-06-26: 20 mL

## 2024-06-26 MED ORDER — SODIUM CHLORIDE 0.9% FLUSH: 3.0000 mL | INTRAVENOUS | Status: DC | PRN

## 2024-06-26 MED ORDER — ISOPROTERENOL HCL 0.2 MG/ML IJ SOLN
INTRAVENOUS | Status: DC | PRN
  Administered 2024-06-26: 2 ug/min via INTRAVENOUS

## 2024-06-26 MED ORDER — DEXMEDETOMIDINE HCL IN NACL 80 MCG/20ML IV SOLN
INTRAVENOUS | Status: AC
Start: 2024-06-26 — End: 2024-06-26
  Filled 2024-06-26: qty 20

## 2024-06-26 MED ORDER — SODIUM CHLORIDE 0.9% FLUSH: 3.0000 mL | Freq: Two times a day (BID) | INTRAVENOUS | Status: DC

## 2024-06-26 SURGICAL SUPPLY — 11 items
CATH EZ STEER NAV 8MM F-J CUR (ABLATOR) IMPLANT
CATH JOSEPH QUAD ALLRED 6F REP (CATHETERS) IMPLANT
CATH WEBSTER BI DIR CS D-F CRV (CATHETERS) IMPLANT
CLOSURE MYNX CONTROL 6F/7F (Vascular Products) IMPLANT
PACK EP LF (CUSTOM PROCEDURE TRAY) ×1 IMPLANT
PAD DEFIB RADIO PHYSIO CONN (PAD) ×1 IMPLANT
PATCH CARTO3 (PAD) IMPLANT
SHEATH PINNACLE 6F 10CM (SHEATH) IMPLANT
SHEATH PINNACLE 7F 10CM (SHEATH) IMPLANT
SHEATH PINNACLE 8F 10CM (SHEATH) IMPLANT
SHEATH PROBE COVER 6X72 (BAG) IMPLANT

## 2024-06-26 NOTE — Discharge Instructions (Signed)

## 2024-06-26 NOTE — H&P (Signed)
  Electrophysiology Office Note:   Date:  06/26/2024  ID:  Antonio Tapia, DOB [birth year], MRN [id number]  Primary Cardiologist: Toribio Fuel, MD Primary Heart Failure: None Electrophysiologist: Kenta Laster Gladis Norton, MD      History of Present Illness:   Antonio Tapia is a 43 y.o. male with h/o alcohol abuse, atrial flutter, tobacco abuse, chronic systolic heart failure seen today for  for Electrophysiology evaluation of atrial flutter at the request of [facility].    He was admitted to the hospital in 2021 with acute heart failure and volume overload.  He had SVT and emergently decompensated.  He had cardioversion.  He was started on amiodarone , milrinone , norepinephrine .  Ejection fraction was 40 to 45%.  He was again admitted to the hospital March 2025 with atrial flutter.  He spontaneously converted to sinus rhythm and was discharged home.  Today, denies symptoms of palpitations, chest pain, dyspnea, orthopnea, PND, lower extremity edema, claudication, dizziness, presyncope, syncope, bleeding, or neurologic sequela. The patient is tolerating medications without difficulties. Plan ablation today.  EP Information / Studies Reviewed:    EKG is ordered today. Personal review as below.        Risk Assessment/Calculations:    CHA2DS2-VASc Score = 2   This indicates a 2.2% annual risk of stroke. The patient's score is based upon: CHF History: 1 HTN History: 1 Diabetes History: 0 Stroke History: 0 Vascular Disease History: 0 Age Score: 0 Gender Score: 0            Physical Exam:   VS:  BP 138/86   Pulse 77   Temp 98.1 F (36.7 C) (Oral)   Resp 16   Ht 6' (1.829 m)   Wt 127.5 kg   SpO2 99%   BMI 38.11 kg/m    Wt Readings from Last 3 Encounters:  06/26/24 127.5 kg  04/04/24 125 kg  04/03/24 124 kg    GEN: No acute distress.   Neck: No JVD Cardiac: RRR, no murmurs, rubs, or gallops.  Respiratory: normal BS bilaterally. GI: Soft, nontender,  non-distended  MS: No edema; No deformity. Neuro:  Nonfocal  Skin: warm and dry Psych: Normal affect    ASSESSMENT AND PLAN:    1.  Atrial flutter:  2.  SVT Antonio Tapia has presented today for surgery, with the diagnosis of SVT/atrial flutter.  The various methods of treatment have been discussed with the patient and family. After consideration of risks, benefits and other options for treatment, the patient has consented to  Procedure(s): Catheter ablation as a surgical intervention .  Risks include but not limited to complete heart block, stroke, esophageal damage, nerve damage, bleeding, vascular damage, tamponade, perforation, MI, and death. The patient's history has been reviewed, patient examined, no change in status, stable for surgery.  I have reviewed the patient's chart and labs.  Questions were answered to the patient's satisfaction.    Wilhelmena Zea Norton, MD 06/26/2024 8:54 AM

## 2024-06-26 NOTE — Anesthesia Postprocedure Evaluation (Signed)
 Anesthesia Post Note  Patient: Antonio Tapia  Procedure(s) Performed: A-FLUTTER ABLATION SVT ABLATION     Patient location during evaluation: PACU Anesthesia Type: MAC Level of consciousness: awake and alert and oriented Pain management: pain level controlled Vital Signs Assessment: post-procedure vital signs reviewed and stable Respiratory status: spontaneous breathing, nonlabored ventilation and respiratory function stable Cardiovascular status: stable and blood pressure returned to baseline Postop Assessment: no apparent nausea or vomiting Anesthetic complications: no   There were no known notable events for this encounter.  Last Vitals:  Vitals:   06/26/24 1153 06/26/24 1210  BP:  115/76  Pulse:  66  Resp:  14  Temp: 36.4 C   SpO2:  96%    Last Pain:  Vitals:   06/26/24 1210  TempSrc:   PainSc: 0-No pain   Pain Goal:                   Laron Boorman A.

## 2024-06-26 NOTE — Transfer of Care (Signed)
 Immediate Anesthesia Transfer of Care Note  Patient: Antonio Tapia  Procedure(s) Performed: A-FLUTTER ABLATION SVT ABLATION  Patient Location: PACU  Anesthesia Type:MAC  Level of Consciousness: awake, alert , and oriented  Airway & Oxygen Therapy: Patient Spontanous Breathing; Nasal cannula  Post-op Assessment: Report given to RN and Post -op Vital signs reviewed and stable  Post vital signs: Reviewed and stable  Last Vitals:  Vitals Value Taken Time  BP 117/74 06/26/24 11:27  Temp 36.5 C 06/26/24 11:24  Pulse 69 06/26/24 11:28  Resp 18 06/26/24 11:30  SpO2 100 % 06/26/24 11:28  Vitals shown include unfiled device data.  Last Pain:  Vitals:   06/26/24 1124  TempSrc: Oral  PainSc: 0-No pain         Complications: There were no known notable events for this encounter.

## 2024-06-27 ENCOUNTER — Telehealth (HOSPITAL_COMMUNITY): Payer: Self-pay

## 2024-06-27 ENCOUNTER — Encounter (HOSPITAL_COMMUNITY): Payer: Self-pay | Admitting: Cardiology

## 2024-06-27 NOTE — Telephone Encounter (Signed)
 Spoke with patient to complete post procedure follow up call.  Patient reports no complications with groin sites.   Instructions reviewed with patient:  Remove large bandage at puncture site after 24 hours. It is normal to have bruising, tenderness, mild swelling, and a pea or marble sized lump/knot at the groin site which can take up to three months to resolve.  Get help right away if you notice sudden swelling at the puncture site.  Check your puncture site every day for signs of infection: fever, redness, swelling, pus drainage, warmth, foul odor or excessive pain. If this occurs, please call the office at [phone], to speak with the nurse. Get help right away if your puncture site is bleeding and the bleeding does not stop after applying firm pressure to the area.  You may continue to have skipped beats during the first several months after your procedure.  It is very important not to miss any doses of your blood thinner Pradaxa .    You will follow up with the APP on 07/27/24.   Patient verbalized understanding to all instructions provided.

## 2024-06-28 MED FILL — Propofol IV Emul 1000 MG/100ML (10 MG/ML): INTRAVENOUS | Qty: 50 | Status: AC

## 2024-06-28 MED FILL — Fentanyl Citrate Preservative Free (PF) Inj 100 MCG/2ML: INTRAMUSCULAR | Qty: 2.5 | Status: AC

## 2024-06-28 MED FILL — Propofol IV Emul 1000 MG/100ML (10 MG/ML): INTRAVENOUS | Qty: 13 | Status: AC

## 2024-07-27 ENCOUNTER — Other Ambulatory Visit: Payer: Self-pay

## 2024-07-27 ENCOUNTER — Ambulatory Visit: Admitting: Physician Assistant

## 2024-07-28 ENCOUNTER — Other Ambulatory Visit: Payer: Self-pay

## 2024-08-09 NOTE — Progress Notes (Deleted)
  Cardiology Office Note:  .   Date:  08/09/2024  ID:  Antonio Tapia, DOB Jul 16, 1981, MRN [id number] PCP: Antonio Bleacher, MD  [facility] Providers Cardiologist:  Antonio Fuel, MD Electrophysiologist:  Antonio Gladis Norton, MD {  History of Present Illness: .   Antonio Tapia is a 43 y.o. male w/PMHx of  ETOH/tobacco abuse Chronic combined HF AFlutter  Admitted Oct 2021 with ADHF and marked volume overload. On the day of admit he continued to decompensate with SVT.  Underwent Emergent DC-CV and intubation. Placed on Norepi + milrinone + amio drip. Gradually improved and RHC/LHC on 09/30/20 with normal coronaries, EF 40-45%   Admitted 3/25 with AFL with RVR, non compliant with meds. Echo showed EF 45-50%. Spontaneously converted to NSR.   Saw the HF team 03/20/24, reported doing ok post discharge ETOH down to 2-3 drinks/week and 7 cigarrettes/day Taking his meds > staggers them to avoid dizziness Working GDMT advanced Was pending EP eval to consider ablation  He saw Dr. Norton 04/04/24, discussed management options SVT/AFlutter >> planned for ablation  06/26/24: EPS/ablation of CTI ,  no other inducible arrhythmias   Today's visit is scheduled as his post ablation visit ROS:   *** stop OAC?\, watch, ?monitor of some kind? *** no AFib hx    Arrhythmia/AAD hx 06/26/24: CTI ablation  Studies Reviewed: SABRA    EKG done today and reviewed by myself:  ***   06/26/24: EPS/ablation CONCLUSIONS:   1. Isthmus-dependent right atrial flutter upon presentation.   2. Successful radiofrequency ablation of atrial flutter along the cavotricuspid isthmus with complete bidirectional isthmus block achieved.   3. No inducible arrhythmias following ablation.   4. No early apparent complications.   - Echo 3/25: EF 45-50%, normal RV,    - Echo (4/22): EF 60-65% mod LVH G2DD   - R/LHC (10/21)  Assessment: 1. 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 Well-compensated  filling pressures   Risk Assessment/Calculations:    Physical Exam:   VS:  There were no vitals taken for this visit.   Wt Readings from Last 3 Encounters:  06/26/24 281 lb (127.5 kg)  04/04/24 275 lb 9.6 oz (125 kg)  04/03/24 273 lb 6.4 oz (124 kg)    GEN: Well nourished, well developed in no acute distress NECK: No JVD; No carotid bruits CARDIAC: ***RRR, no murmurs, rubs, gallops RESPIRATORY:  *** CTA b/l without rales, wheezing or rhonchi  ABDOMEN: Soft, non-tender, non-distended EXTREMITIES: *** No edema; No deformity   ASSESSMENT AND PLAN: .    AFlutter CHA2DS2Vasc is 2, on Pradaxa , *** appropriately dosed Now s/p CTI ablation with no other inducible arrhythmias at the time of his EPS No hx of Afib ***  SVT 2021 Not inducible at time of his EPS  NICM Suspect ETOH *** C/w AHF team    {Are you ordering a CV Procedure (e.g. stress test, cath, DCCV, TEE, etc)?   Press F2        :[id number]}     Dispo: ***  Signed, Antonio Macario Arthur, PA-C

## 2024-08-11 ENCOUNTER — Ambulatory Visit: Admitting: Physician Assistant

## 2024-08-22 NOTE — Progress Notes (Deleted)
  Cardiology Office Note:  .   Date:  08/22/2024  ID:  Antonio Tapia, DOB 1981/09/24, MRN [id number] PCP: Tanda Bleacher, MD  [facility] Providers Cardiologist:  Toribio Fuel, MD Electrophysiologist:  Soyla Gladis Norton, MD {  History of Present Illness: .   Antonio Tapia is a 43 y.o. male w/PMHx of  ETOH/tobacco abuse Chronic combined HF AFlutter  Admitted Oct 2021 with ADHF and marked volume overload. On the day of admit he continued to decompensate with SVT.  Underwent Emergent DC-CV and intubation. Placed on Norepi + milrinone + amio drip. Gradually improved and RHC/LHC on 09/30/20 with normal coronaries, EF 40-45%   Admitted 3/25 with AFL with RVR, non compliant with meds. Echo showed EF 45-50%. Spontaneously converted to NSR.   Saw the HF team 03/20/24, reported doing ok post discharge ETOH down to 2-3 drinks/week and 7 cigarrettes/day Taking his meds > staggers them to avoid dizziness Working GDMT advanced Was pending EP eval to consider ablation  He saw Dr. Norton 04/04/24, discussed management options SVT/AFlutter >> planned for ablation  06/26/24: EPS/ablation of CTI ,  no other inducible arrhythmias   Today's visit is scheduled as his post ablation visit ROS:   *** stop OAC?\, watch, ?monitor of some kind? *** no AFib hx    Arrhythmia/AAD hx 06/26/24: CTI ablation  Studies Reviewed: SABRA    EKG done today and reviewed by myself:  ***   06/26/24: EPS/ablation CONCLUSIONS:   1. Isthmus-dependent right atrial flutter upon presentation.   2. Successful radiofrequency ablation of atrial flutter along the cavotricuspid isthmus with complete bidirectional isthmus block achieved.   3. No inducible arrhythmias following ablation.   4. No early apparent complications.   - Echo 3/25: EF 45-50%, normal RV,    - Echo (4/22): EF 60-65% mod LVH G2DD   - R/LHC (10/21)  Assessment: 1. 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 Well-compensated  filling pressures   Risk Assessment/Calculations:    Physical Exam:   VS:  There were no vitals taken for this visit.   Wt Readings from Last 3 Encounters:  06/26/24 281 lb (127.5 kg)  04/04/24 275 lb 9.6 oz (125 kg)  04/03/24 273 lb 6.4 oz (124 kg)    GEN: Well nourished, well developed in no acute distress NECK: No JVD; No carotid bruits CARDIAC: ***RRR, no murmurs, rubs, gallops RESPIRATORY:  *** CTA b/l without rales, wheezing or rhonchi  ABDOMEN: Soft, non-tender, non-distended EXTREMITIES: *** No edema; No deformity   ASSESSMENT AND PLAN: .    AFlutter CHA2DS2Vasc is 2, on Pradaxa , *** appropriately dosed Now s/p CTI ablation with no other inducible arrhythmias at the time of his EPS No hx of Afib ***  SVT 2021 Not inducible at time of his EPS  NICM Suspect ETOH *** C/w AHF team    {Are you ordering a CV Procedure (e.g. stress test, cath, DCCV, TEE, etc)?   Press F2        :[id number]}     Dispo: ***  Signed, Charlies Macario Arthur, PA-C

## 2024-08-23 ENCOUNTER — Other Ambulatory Visit (HOSPITAL_COMMUNITY): Payer: Self-pay

## 2024-08-24 ENCOUNTER — Ambulatory Visit: Attending: Physician Assistant | Admitting: Physician Assistant

## 2024-08-25 ENCOUNTER — Encounter: Payer: Self-pay | Admitting: Physician Assistant

## 2024-10-03 ENCOUNTER — Ambulatory Visit: Admitting: Family Medicine

## 2024-10-03 ENCOUNTER — Telehealth: Payer: Self-pay | Admitting: Family Medicine

## 2024-10-03 NOTE — Telephone Encounter (Signed)
 Called patient to reschedule the appt that patient missed today. Left voicemail.

## 2024-11-03 ENCOUNTER — Other Ambulatory Visit: Payer: Self-pay

## 2024-11-06 ENCOUNTER — Other Ambulatory Visit: Payer: Self-pay

## 2024-11-15 ENCOUNTER — Other Ambulatory Visit: Payer: Self-pay

## 2024-11-16 ENCOUNTER — Other Ambulatory Visit: Payer: Self-pay
